# Patient Record
Sex: Male | Born: 1960 | Race: White | Hispanic: No | Marital: Married | Smoking: Former smoker
Health system: Southern US, Community
[De-identification: ages and names within clinical notes are randomized; demographics above are authoritative.]

## PROBLEM LIST (undated history)

## (undated) DIAGNOSIS — J189 Pneumonia, unspecified organism: Secondary | ICD-10-CM

## (undated) DIAGNOSIS — E785 Hyperlipidemia, unspecified: Secondary | ICD-10-CM

## (undated) DIAGNOSIS — K219 Gastro-esophageal reflux disease without esophagitis: Secondary | ICD-10-CM

## (undated) DIAGNOSIS — J449 Chronic obstructive pulmonary disease, unspecified: Secondary | ICD-10-CM

## (undated) DIAGNOSIS — I10 Essential (primary) hypertension: Secondary | ICD-10-CM

## (undated) DIAGNOSIS — G4730 Sleep apnea, unspecified: Secondary | ICD-10-CM

## (undated) HISTORY — DX: Chronic obstructive pulmonary disease, unspecified: J44.9

## (undated) HISTORY — DX: Gastro-esophageal reflux disease without esophagitis: K21.9

## (undated) HISTORY — DX: Pneumonia, unspecified organism: J18.9

## (undated) HISTORY — DX: Essential (primary) hypertension: I10

## (undated) HISTORY — DX: Hyperlipidemia, unspecified: E78.5

## (undated) HISTORY — DX: Sleep apnea, unspecified: G47.30

## (undated) HISTORY — PX: COLONOSCOPY: SHX174

## (undated) HISTORY — PX: INGUINAL HERNIA REPAIR: SUR1180

## (undated) HISTORY — PX: VASECTOMY: SHX75

---

## 1998-12-15 ENCOUNTER — Ambulatory Visit: Admission: RE | Admit: 1998-12-15 | Discharge: 1998-12-15 | Payer: Self-pay | Admitting: *Deleted

## 2004-10-28 ENCOUNTER — Ambulatory Visit (HOSPITAL_COMMUNITY): Admission: RE | Admit: 2004-10-28 | Discharge: 2004-10-28 | Payer: Self-pay | Admitting: Surgery

## 2004-10-28 ENCOUNTER — Encounter (INDEPENDENT_AMBULATORY_CARE_PROVIDER_SITE_OTHER): Payer: Self-pay | Admitting: *Deleted

## 2008-01-10 ENCOUNTER — Encounter: Payer: Self-pay | Admitting: Gastroenterology

## 2008-02-07 ENCOUNTER — Ambulatory Visit: Payer: Self-pay | Admitting: Gastroenterology

## 2008-02-07 DIAGNOSIS — R7401 Elevation of levels of liver transaminase levels: Secondary | ICD-10-CM | POA: Insufficient documentation

## 2008-02-07 DIAGNOSIS — R740 Nonspecific elevation of levels of transaminase and lactic acid dehydrogenase [LDH]: Secondary | ICD-10-CM

## 2008-02-07 DIAGNOSIS — R1032 Left lower quadrant pain: Secondary | ICD-10-CM | POA: Insufficient documentation

## 2008-02-07 LAB — CONVERTED CEMR LAB
AST: 28 units/L (ref 0–37)
Albumin: 4.3 g/dL (ref 3.5–5.2)
Alkaline Phosphatase: 40 units/L (ref 39–117)
Basophils Absolute: 0 10*3/uL (ref 0.0–0.1)
Eosinophils Absolute: 0.1 10*3/uL (ref 0.0–0.7)
HCT: 50.1 % (ref 39.0–52.0)
MCHC: 33.8 g/dL (ref 30.0–36.0)
MCV: 89.6 fL (ref 78.0–100.0)
Monocytes Absolute: 0.4 10*3/uL (ref 0.1–1.0)
Monocytes Relative: 9.8 % (ref 3.0–12.0)
Neutro Abs: 2.3 10*3/uL (ref 1.4–7.7)
Platelets: 125 10*3/uL — ABNORMAL LOW (ref 150–400)
Potassium: 4.7 meq/L (ref 3.5–5.1)
RDW: 12.6 % (ref 11.5–14.6)
Sodium: 141 meq/L (ref 135–145)
Total Bilirubin: 1 mg/dL (ref 0.3–1.2)
Total Protein: 6.3 g/dL (ref 6.0–8.3)

## 2008-02-20 ENCOUNTER — Ambulatory Visit: Payer: Self-pay | Admitting: Gastroenterology

## 2008-02-20 ENCOUNTER — Encounter: Payer: Self-pay | Admitting: Gastroenterology

## 2008-02-21 LAB — CONVERTED CEMR LAB
Ferritin: 256.3 ng/mL (ref 22.0–322.0)
IgA: 143 mg/dL (ref 68–378)
Saturation Ratios: 31.3 % (ref 20.0–50.0)

## 2008-02-22 ENCOUNTER — Encounter: Payer: Self-pay | Admitting: Gastroenterology

## 2008-02-24 ENCOUNTER — Telehealth: Payer: Self-pay | Admitting: Gastroenterology

## 2008-02-27 LAB — CONVERTED CEMR LAB
Anti Nuclear Antibody(ANA): NEGATIVE
Ceruloplasmin: 15 mg/dL — ABNORMAL LOW (ref 21–63)
Hep A Total Ab: NEGATIVE
Hep B S Ab: NEGATIVE

## 2008-03-05 ENCOUNTER — Ambulatory Visit: Payer: Self-pay | Admitting: Gastroenterology

## 2008-03-05 ENCOUNTER — Telehealth (INDEPENDENT_AMBULATORY_CARE_PROVIDER_SITE_OTHER): Payer: Self-pay | Admitting: *Deleted

## 2008-03-12 ENCOUNTER — Encounter: Payer: Self-pay | Admitting: Gastroenterology

## 2008-03-19 ENCOUNTER — Telehealth (INDEPENDENT_AMBULATORY_CARE_PROVIDER_SITE_OTHER): Payer: Self-pay | Admitting: *Deleted

## 2008-03-19 LAB — CONVERTED CEMR LAB
Creatinine 24 HR UR: 4712 mg/24hr — ABNORMAL HIGH (ref 800–2000)
Creatinine, Urine: 157.1 mg/dL

## 2008-03-22 ENCOUNTER — Telehealth (INDEPENDENT_AMBULATORY_CARE_PROVIDER_SITE_OTHER): Payer: Self-pay | Admitting: *Deleted

## 2008-03-28 ENCOUNTER — Encounter: Payer: Self-pay | Admitting: Gastroenterology

## 2008-04-10 ENCOUNTER — Ambulatory Visit: Payer: Self-pay | Admitting: Gastroenterology

## 2008-04-11 ENCOUNTER — Ambulatory Visit: Payer: Self-pay | Admitting: Gastroenterology

## 2008-04-12 LAB — CONVERTED CEMR LAB
AST: 27 units/L (ref 0–37)
Albumin: 4.3 g/dL (ref 3.5–5.2)
Alkaline Phosphatase: 31 units/L — ABNORMAL LOW (ref 39–117)
Total Bilirubin: 1.2 mg/dL (ref 0.3–1.2)

## 2008-09-19 ENCOUNTER — Ambulatory Visit: Payer: Self-pay | Admitting: Gastroenterology

## 2008-09-20 LAB — CONVERTED CEMR LAB
ALT: 48 units/L (ref 0–53)
AST: 23 units/L (ref 0–37)
Alkaline Phosphatase: 36 units/L — ABNORMAL LOW (ref 39–117)
Total Bilirubin: 1.2 mg/dL (ref 0.3–1.2)

## 2009-03-21 ENCOUNTER — Ambulatory Visit: Payer: Self-pay | Admitting: Gastroenterology

## 2009-03-21 ENCOUNTER — Telehealth (INDEPENDENT_AMBULATORY_CARE_PROVIDER_SITE_OTHER): Payer: Self-pay | Admitting: *Deleted

## 2009-03-21 LAB — CONVERTED CEMR LAB
ALT: 65 units/L — ABNORMAL HIGH (ref 0–53)
AST: 34 units/L (ref 0–37)
Albumin: 4 g/dL (ref 3.5–5.2)
Total Bilirubin: 1.2 mg/dL (ref 0.3–1.2)

## 2009-03-27 ENCOUNTER — Encounter: Payer: Self-pay | Admitting: Cardiology

## 2009-04-16 ENCOUNTER — Ambulatory Visit: Payer: Self-pay | Admitting: Gastroenterology

## 2011-01-30 NOTE — Op Note (Signed)
Richard Moore, Richard Moore             ACCOUNT NO.:  [id number]   MEDICAL RECORD NO.:  [id number]          PATIENT TYPE:  AMB   LOCATION:  DAY                          FACILITY:  [facility]   PHYSICIAN:  Sandria Bales. Ezzard Standing, M.D.  DATE OF BIRTH:  Mar 04, 1961   DATE OF PROCEDURE:  10/28/2004  DATE OF DISCHARGE:                                 OPERATIVE REPORT   PREOPERATIVE DIAGNOSES:  1.  Right inguinal hernia.  2.  Umbilical hernia.  3.  Desire for sterilization.   POSTOPERATIVE DIAGNOSES:  1.  Direct right inguinal hernia.  2.  A 2 cm umbilical hernia.  3.  Desire for sterilization.   PROCEDURE:  Laparoscopic right inguinal hernia repair with onlay mesh,  repair of umbilical hernia, bilateral vasectomy.   SURGEON:  Dr. Ezzard Standing   FIRST ASSISTANT:  None.   ANESTHESIA:  General endotracheal.   ESTIMATED BLOOD LOSS:  Minimal.   INDICATIONS FOR PROCEDURE:  Richard Moore is a pleasant 50 year old white  male, who has a symptomatic umbilical and right inguinal hernia.  I have  discussed with him about repairing this hernia, both open and laparoscopic  and think because he has the umbilical hernia, he would be a good candidate  for laparoscopic hernia repair.  He also was interested in having a vasectomy at this time.  We talked about  vasectomy being a permanent form of sterilization, talked about that at the  time of surgery, the vas resect would be sent to pathology so he would have  report of that.  I suggested that he have a sperm count 8-12 weeks postop to  make sure that his sperm count is zero.   He also realizes that vasectomies can have failure rates, but they are very  rare.   OPERATIVE NOTE:  The patient placed in a supine position.  He had his left  arm tucked to his side.  He was given 1 g of Ancef at the initiation of the  procedure, had a Foley catheter in place.  His abdomen was shaved, prepped  with Betadine solution, and sterilely draped.   I made three abdominal  incisions, an infraumbilical incision, anticipating  repair of his umbilical hernia.  Then I placed a small  incision on the  inside of his right lower quadrant and inside of his left lower quadrant.  I  first got into the preperitoneal space using the preperitoneal balloon PVD-2  balloon with finger distention of the midline, though it did not do very  well lateral disseciton.  I then had to dissect out on the left side.  I  used 5 mm from the [employer], trocars both in the right and left lower  quadrants.   I then dissected out the inguinal floor.  The patient had a medium-sized  direct inguinal hernia with a hernia hole about 2.5-3 cm in diameter.  I did  not see any of his indirect inguinal hernia or indirect sac.  I did dissect  out the vas, first on the right.  I divided this between double staples, and  I took a piece of  about 1-1.5 cm of the vas out and sent to pathology.  I  then placed in an onlay mesh which was approximately 4 x 6 cm, with slight  tapering laterally.  It was placed in the preperitoneal space and tacked  medially to the pubic tubercle and inferiorly to the shelving edge of  Cooper's ligament, superiorly to transversalis fascia, and then laterally on  palpation to transversalis fascia.  I made sure that I put no staples  lateral to the iliac vessels and inferior to the curve of the inguinal  floor.   I then turned my attention to the left side where I was able to dissect out  the left vas laparoscopically without doing much dissection of the cord  structures themselves.  I took about a 1.5-2 cm segment of vas, sent this to  pathology, put double clips above and below on the vas.   I then removed the trocars in turn.  I removed the umbilical trocar and then  turned my attention to the umbilical hernia.   The umbilical hernia I dissected off the hernia off the hernia sac, got into  the peritoneal space and, again, the base was just 2 cm above where I had   gotten behind his rectus abdominus muscle for his hernias.   I then repaired the umbilical hernia with interrupted #1 Novofil sutures,  two medial sutures which are inverting and two lateral sutures which were  not inverting.   At this point, I completed the  umbilical hernia repair, I had repaired his  inguinal hernia and done the vasectomy.  I tacked the skin back down using 3-  0 Vicryl sutures, closed the subcutaneous tissue with 3-0 Vicryl sutures and  the skin with a 4-0 Monocryl suture.  I closed both the lower abdominal  ports with 4-0 Monocryl suture, painted each wound with tincture of Benzoin  and steri-stripped it.   He tolerated the procedure well, was transported to recovery room in good  condition.  Sponge and needle count were correct at the end of the case.  He  will be discharged home today and will return to see me in 2 weeks.      DHN/MEDQ  D:  10/28/2004  T:  10/28/2004  Job:  [id number]   cc:   Montey Hora, MD  [facility]. Prac.

## 2011-04-27 ENCOUNTER — Encounter: Payer: Self-pay | Admitting: Gastroenterology

## 2011-08-05 ENCOUNTER — Encounter: Payer: Self-pay | Admitting: Gastroenterology

## 2011-08-14 ENCOUNTER — Ambulatory Visit: Payer: Self-pay | Admitting: Gastroenterology

## 2011-08-21 ENCOUNTER — Ambulatory Visit: Payer: Self-pay | Admitting: Gastroenterology

## 2011-08-21 ENCOUNTER — Ambulatory Visit (AMBULATORY_SURGERY_CENTER): Payer: BC Managed Care – PPO

## 2011-08-21 VITALS — Ht 75.0 in | Wt 233.9 lb

## 2011-08-21 DIAGNOSIS — K625 Hemorrhage of anus and rectum: Secondary | ICD-10-CM

## 2011-08-21 DIAGNOSIS — Z860100 Personal history of colon polyps, unspecified: Secondary | ICD-10-CM

## 2011-08-21 MED ORDER — PEG-KCL-NACL-NASULF-NA ASC-C 100 G PO SOLR: 1.0000 | Freq: Once | ORAL | Status: AC

## 2011-08-24 ENCOUNTER — Encounter: Payer: Self-pay | Admitting: Gastroenterology

## 2011-08-28 ENCOUNTER — Encounter: Payer: Self-pay | Admitting: Gastroenterology

## 2011-08-28 ENCOUNTER — Ambulatory Visit (AMBULATORY_SURGERY_CENTER): Payer: BC Managed Care – PPO | Admitting: Gastroenterology

## 2011-08-28 VITALS — BP 133/91 | HR 59 | Temp 96.7°F | Resp 16 | Ht 75.0 in | Wt 233.0 lb

## 2011-08-28 DIAGNOSIS — Z860100 Personal history of colon polyps, unspecified: Secondary | ICD-10-CM

## 2011-08-28 DIAGNOSIS — D126 Benign neoplasm of colon, unspecified: Secondary | ICD-10-CM

## 2011-08-28 DIAGNOSIS — Z1211 Encounter for screening for malignant neoplasm of colon: Secondary | ICD-10-CM

## 2011-08-28 MED ORDER — SODIUM CHLORIDE 0.9 % IV SOLN: 500.0000 mL | INTRAVENOUS | Status: DC

## 2011-08-28 NOTE — Patient Instructions (Signed)
Please follow all discharge instructions given to you by the recovery room nurse. If you have any questions or problems after discharge please call [phone]. You will receive a phone call in the am to see how you are doing and answer any questions you may have. Thank you for choosing [facility] for your health care needs.  

## 2011-08-28 NOTE — Progress Notes (Signed)
Patient did not experience any of the following events: a burn prior to discharge; a fall within the facility; wrong site/side/patient/procedure/implant event; or a hospital transfer or hospital admission upon discharge from the facility. (G8907) Patient did not have preoperative order for IV antibiotic SSI prophylaxis. (G8918)  

## 2011-08-28 NOTE — Op Note (Signed)
[facility] [address] [place], [place]  [address]  COLONOSCOPY PROCEDURE REPORT  PATIENT:  Richard Moore, Richard Moore  MR#:  [id number] BIRTHDATE:  20-Nov-1960, 50 yrs. old  GENDER:  male ENDOSCOPIST:  Rachael Fee, MD PROCEDURE DATE:  08/28/2011 PROCEDURE:  Colonoscopy with biopsy ASA CLASS:  Class II INDICATIONS:  four adenomatous polyps removed 2009 MEDICATIONS:   Fentanyl 75 mcg IV, These medications were titrated to patient response per physician's verbal order, Versed 8 mg IV  DESCRIPTION OF PROCEDURE:   After the risks benefits and alternatives of the procedure were thoroughly explained, informed consent was obtained.  Digital rectal exam was performed and revealed no rectal masses.   The LB [id number] endoscope was introduced through the anus and advanced to the cecum, which was identified by both the appendix and ileocecal valve, without limitations.  The quality of the prep was good..  The instrument was then slowly withdrawn as the colon was fully examined. <<PROCEDUREIMAGES>> FINDINGS:  A diminutive polyp was found in the ascending colon. This was removed with forceps and sent to pathology (jar 1).  This was otherwise a normal examination of the colon (see image2, image1, and image3).   Retroflexed views in the rectum revealed no abnormalities. COMPLICATIONS:  None  ENDOSCOPIC IMPRESSION: 1) Diminutive polyp in the ascending colon, this was removed and sent to pathology 2) Otherwise normal examination  RECOMMENDATIONS: 1) Given your personal history of adenomatous (pre-cancerous) polyps, you will need a repeat colonoscopy in 5 years even if the polyp removed today is NOT precancerous. 2) You will receive a letter within 1-2 weeks with the results of your biopsy as well as final recommendations. Please call my office if you have not received a letter after 3 weeks.  ______________________________ Rachael Fee, MD  cc: Rudi Heap,  MD  n. Rosalie Doctor:   Rachael Fee at 08/28/2011 11:31 AM  Ronette Deter, [phone]

## 2011-08-31 ENCOUNTER — Telehealth: Payer: Self-pay | Admitting: *Deleted

## 2011-08-31 NOTE — Telephone Encounter (Signed)

## 2012-01-13 ENCOUNTER — Emergency Department (HOSPITAL_COMMUNITY): Payer: BC Managed Care – PPO

## 2012-01-13 ENCOUNTER — Emergency Department (HOSPITAL_COMMUNITY)
Admission: EM | Admit: 2012-01-13 | Discharge: 2012-01-14 | Disposition: A | Discharge status: Home / Self Care | Payer: BC Managed Care – PPO | Attending: Emergency Medicine | Admitting: Emergency Medicine

## 2012-01-13 ENCOUNTER — Encounter (HOSPITAL_COMMUNITY): Payer: Self-pay | Admitting: *Deleted

## 2012-01-13 DIAGNOSIS — R51 Headache: Secondary | ICD-10-CM | POA: Insufficient documentation

## 2012-01-13 DIAGNOSIS — M549 Dorsalgia, unspecified: Secondary | ICD-10-CM | POA: Insufficient documentation

## 2012-01-13 DIAGNOSIS — R6883 Chills (without fever): Secondary | ICD-10-CM | POA: Insufficient documentation

## 2012-01-13 DIAGNOSIS — I10 Essential (primary) hypertension: Secondary | ICD-10-CM | POA: Insufficient documentation

## 2012-01-13 DIAGNOSIS — E785 Hyperlipidemia, unspecified: Secondary | ICD-10-CM | POA: Insufficient documentation

## 2012-01-13 DIAGNOSIS — M545 Low back pain, unspecified: Secondary | ICD-10-CM | POA: Insufficient documentation

## 2012-01-13 LAB — DIFFERENTIAL
Basophils Absolute: 0 10*3/uL (ref 0.0–0.1)
Basophils Relative: 0 % (ref 0–1)
Eosinophils Absolute: 0 10*3/uL (ref 0.0–0.7)
Eosinophils Relative: 0 % (ref 0–5)
Lymphocytes Relative: 13 % (ref 12–46)
Lymphs Abs: 1.5 10*3/uL (ref 0.7–4.0)
Monocytes Absolute: 1.5 10*3/uL — ABNORMAL HIGH (ref 0.1–1.0)
Monocytes Relative: 13 % — ABNORMAL HIGH (ref 3–12)
Neutro Abs: 8.8 10*3/uL — ABNORMAL HIGH (ref 1.7–7.7)
Neutrophils Relative %: 74 % (ref 43–77)

## 2012-01-13 LAB — BASIC METABOLIC PANEL
Calcium: 9.8 mg/dL (ref 8.4–10.5)
Chloride: 94 mEq/L — ABNORMAL LOW (ref 96–112)
Creatinine, Ser: 1.17 mg/dL (ref 0.50–1.35)
GFR calc Af Amer: 83 mL/min — ABNORMAL LOW (ref >90)

## 2012-01-13 LAB — CBC
HCT: 45.1 % (ref 39.0–52.0)
MCHC: 35.5 g/dL (ref 30.0–36.0)
MCV: 86.4 fL (ref 78.0–100.0)
RDW: 12.6 % (ref 11.5–15.5)

## 2012-01-13 LAB — URINALYSIS, ROUTINE W REFLEX MICROSCOPIC
Glucose, UA: NEGATIVE mg/dL
Hgb urine dipstick: NEGATIVE
Ketones, ur: NEGATIVE mg/dL
Protein, ur: NEGATIVE mg/dL

## 2012-01-13 MED ORDER — HYDROMORPHONE HCL PF 1 MG/ML IJ SOLN
1.0000 mg | Freq: Once | INTRAMUSCULAR | Status: AC
  Administered 2012-01-13: 1 mg via INTRAVENOUS
  Filled 2012-01-13: qty 1

## 2012-01-13 MED ORDER — OXYCODONE-ACETAMINOPHEN 5-325 MG PO TABS: 1.0000 | ORAL_TABLET | ORAL | Status: AC | PRN

## 2012-01-13 MED ORDER — HYDROMORPHONE HCL PF 2 MG/ML IJ SOLN
2.0000 mg | Freq: Once | INTRAMUSCULAR | Status: AC
  Administered 2012-01-13: 2 mg via INTRAMUSCULAR
  Filled 2012-01-13: qty 1

## 2012-01-13 MED ORDER — METOCLOPRAMIDE HCL 10 MG PO TABS
10.0000 mg | ORAL_TABLET | Freq: Once | ORAL | Status: AC
  Administered 2012-01-13: 10 mg via ORAL
  Filled 2012-01-13: qty 1

## 2012-01-13 MED ORDER — ONDANSETRON 8 MG PO TBDP
8.0000 mg | ORAL_TABLET | Freq: Once | ORAL | Status: AC
  Administered 2012-01-13: 8 mg via ORAL
  Filled 2012-01-13: qty 1

## 2012-01-13 MED ORDER — CYCLOBENZAPRINE HCL 10 MG PO TABS: 10.0000 mg | ORAL_TABLET | Freq: Three times a day (TID) | ORAL | Status: AC | PRN

## 2012-01-13 MED ORDER — HYDROMORPHONE HCL PF 1 MG/ML IJ SOLN
1.0000 mg | Freq: Once | INTRAMUSCULAR | Status: AC
  Administered 2012-01-13: 1 mg via INTRAMUSCULAR
  Filled 2012-01-13: qty 1

## 2012-01-13 NOTE — ED Notes (Signed)
Patient transported to X-ray 

## 2012-01-13 NOTE — ED Provider Notes (Signed)
Medical screening examination/treatment/procedure(s) were conducted as a shared visit with non-physician practitioner(s) and myself.  I personally evaluated the patient during the encounter   Benny Lennert, MD 01/13/12 [phone]

## 2012-01-13 NOTE — Discharge Instructions (Signed)
Back Pain, Adult  Low back pain is very common. About 1 in 5 people have back pain.The cause of low back pain is rarely dangerous. The pain often gets better over time.About half of people with a sudden onset of back pain feel better in just 2 weeks. About 8 in 10 people feel better by 6 weeks.   CAUSES  Some common causes of back pain include:   Strain of the muscles or ligaments supporting the spine.   Wear and tear (degeneration) of the spinal discs.   Arthritis.   Direct injury to the back.  DIAGNOSIS  Most of the time, the direct cause of low back pain is not known.However, back pain can be treated effectively even when the exact cause of the pain is unknown.Answering your caregiver's questions about your overall health and symptoms is one of the most accurate ways to make sure the cause of your pain is not dangerous. If your caregiver needs more information, he or she may order lab work or imaging tests (X-rays or MRIs).However, even if imaging tests show changes in your back, this usually does not require surgery.  HOME CARE INSTRUCTIONS  For many people, back pain returns.Since low back pain is rarely dangerous, it is often a condition that people can learn to manageon their own.    Remain active. It is stressful on the back to sit or stand in one place. Do not sit, drive, or stand in one place for more than 30 minutes at a time. Take short walks on level surfaces as soon as pain allows.Try to increase the length of time you walk each day.   Do not stay in bed.Resting more than 1 or 2 days can delay your recovery.   Do not avoid exercise or work.Your body is made to move.It is not dangerous to be active, even though your back may hurt.Your back will likely heal faster if you return to being active before your pain is gone.   Pay attention to your body when you bend and lift. Many people have less discomfortwhen lifting if they bend their knees, keep the load close to their bodies,and  avoid twisting. Often, the most comfortable positions are those that put less stress on your recovering back.   Find a comfortable position to sleep. Use a firm mattress and lie on your side with your knees slightly bent. If you lie on your back, put a pillow under your knees.   Only take over-the-counter or prescription medicines as directed by your caregiver. Over-the-counter medicines to reduce pain and inflammation are often the most helpful.Your caregiver may prescribe muscle relaxant drugs.These medicines help dull your pain so you can more quickly return to your normal activities and healthy exercise.   Put ice on the injured area.   Put ice in a plastic bag.   Place a towel between your skin and the bag.   Leave the ice on for 15 to 20 minutes, 3 to 4 times a day for the first 2 to 3 days. After that, ice and heat may be alternated to reduce pain and spasms.   Ask your caregiver about trying back exercises and gentle massage. This may be of some benefit.   Avoid feeling anxious or stressed.Stress increases muscle tension and can worsen back pain.It is important to recognize when you are anxious or stressed and learn ways to manage it.Exercise is a great option.  SEEK MEDICAL CARE IF:   You have pain that is not   relieved with rest or medicine.   You have pain that does not improve in 1 week.   You have new symptoms.   You are generally not feeling well.  SEEK IMMEDIATE MEDICAL CARE IF:    You have pain that radiates from your back into your legs.   You develop new bowel or bladder control problems.   You have unusual weakness or numbness in your arms or legs.   You develop nausea or vomiting.   You develop abdominal pain.   You feel faint.  Document Released: 08/31/2005 Document Revised: 08/20/2011 Document Reviewed: 01/19/2011  [employer] Patient Information 2012 [employer]  Headache, General, Unknown Cause  The specific cause of your headache may not have been found today. There  are many causes and types of headache. A few common ones are:   Tension headache.   Migraine.   Infections (examples: dental and sinus infections).   Bone and/or joint problems in the neck or jaw.   Depression.   Eye problems.  These headaches are not life threatening.   Headaches can sometimes be diagnosed by a patient history and a physical exam. Sometimes, lab and imaging studies (such as x-ray and/or CT scan) are used to rule out more serious problems. In some cases, a spinal tap (lumbar puncture) may be requested. There are many times when your exam and tests may be normal on the first visit even when there is a serious problem causing your headaches. Because of that, it is very important to follow up with your doctor or local clinic for further evaluation.  FINDING OUT THE RESULTS OF TESTS   If a radiology test was performed, a radiologist will review your results.   You will be contacted by the emergency department or your physician if any test results require a change in your treatment plan.   Not all test results may be available during your visit. If your test results are not back during the visit, make an appointment with your caregiver to find out the results. Do not assume everything is normal if you have not heard from your caregiver or the medical facility. It is important for you to follow up on all of your test results.  HOME CARE INSTRUCTIONS    Keep follow-up appointments with your caregiver, or any specialist referral.   Only take over-the-counter or prescription medicines for pain, discomfort, or fever as directed by your caregiver.   Biofeedback, massage, or other relaxation techniques may be helpful.   Ice packs or heat applied to the head and neck can be used. Do this three to four times per day, or as needed.   Call your doctor if you have any questions or concerns.   If you smoke, you should quit.  SEEK MEDICAL CARE IF:    You develop problems with medications  prescribed.   You do not respond to or obtain relief from medications.   You have a change from the usual headache.   You develop nausea or vomiting.  SEEK IMMEDIATE MEDICAL CARE IF:    If your headache becomes severe.   You have an unexplained oral temperature above 102 F (38.9 C), or as your caregiver suggests.   You have a stiff neck.   You have loss of vision.   You have muscular weakness.   You have loss of muscular control.   You develop severe symptoms different from your first symptoms.   You start losing your balance or have trouble walking.     You feel faint or pass out.  MAKE SURE YOU:    Understand these instructions.   Will watch your condition.   Will get help right away if you are not doing well or get worse.  Document Released: 08/31/2005 Document Revised: 08/20/2011 Document Reviewed: 04/19/2008  [employer] Patient Information 2012 [employer]

## 2012-01-13 NOTE — ED Provider Notes (Signed)
History     CSN: [id number]  Arrival date & time 01/13/12  7829   First MD Initiated Contact with Patient 01/13/12 1853      Chief Complaint  Patient presents with  . Back Pain    (Consider location/radiation/quality/duration/timing/severity/associated sxs/prior treatment) HPI Comments: Patient c/o diffuse back pain, nausea, chills and intermittent headaches for 3 days.  States he woke up with sharp , stabbing type pain to his back . States the pain radiates from his shoulder blades to his feet. He denies injury, chest pain, dyspnea, abd pain, incontinence or feces or urine, numbness or weakness.  He also c/o intermittent headaches for "months" and states the headache has persisted since the onset of back pain.  He describes the headache as throbbing pain to the top of his head. He denies neck pain or stiffness, or visual changes   Patient is a 51 y.o. male presenting with back pain. The history is provided by the patient.  Back Pain  This is a new problem. The current episode started more than 2 days ago. The problem occurs constantly. The problem has been gradually worsening. The pain is associated with no known injury. The pain is present in the lumbar spine and thoracic spine. The quality of the pain is described as shooting and stabbing. The pain radiates to the left thigh, right thigh, left foot and right foot. The pain is severe. Exacerbated by: movement. The pain is the same all the time. Associated symptoms include headaches and leg pain. Pertinent negatives include no chest pain, no fever, no numbness, no abdominal pain, no abdominal swelling, no bowel incontinence, no perianal numbness, no bladder incontinence, no dysuria, no pelvic pain, no paresthesias, no paresis, no tingling and no weakness. Associated symptoms comments: Chills, nausea. He has tried NSAIDs for the symptoms. The treatment provided no relief.    Past Medical History  Diagnosis Date  . Hypertension   .  Hyperlipidemia     Past Surgical History  Procedure Date  . Inguinal hernia repair     right  . Vasectomy     History reviewed. No pertinent family history.  History  Substance Use Topics  . Smoking status: Former Smoker    Types: Cigarettes  . Smokeless tobacco: Never Used  . Alcohol Use: 3.0 oz/week    6 drink(s) per week      Review of Systems  Constitutional: Positive for chills. Negative for fever, activity change and appetite change.  HENT: Negative for neck pain and neck stiffness.   Respiratory: Negative for cough and shortness of breath.   Cardiovascular: Negative for chest pain.  Gastrointestinal: Positive for nausea and constipation. Negative for vomiting, abdominal pain and bowel incontinence.  Genitourinary: Negative for bladder incontinence, dysuria, hematuria, flank pain, decreased urine volume, difficulty urinating, testicular pain and pelvic pain.       Perineal numbness or incontinence of urine or feces  Musculoskeletal: Positive for back pain. Negative for joint swelling.  Skin: Negative for color change and rash.  Neurological: Positive for headaches. Negative for tingling, weakness, numbness and paresthesias.  All other systems reviewed and are negative.    Allergies  Review of patient's allergies indicates no known allergies.  Home Medications  No current outpatient prescriptions on file.  BP 133/82  Pulse 111  Temp(Src) 99.7 F (37.6 C) (Oral)  Resp 20  Ht 6\' 3"  (1.905 m)  Wt 220 lb (99.791 kg)  BMI 27.50 kg/m2  SpO2 100%  Physical Exam  Nursing note  and vitals reviewed. Constitutional: He is oriented to person, place, and time. He appears well-developed and well-nourished.       Appears uncomfortable  HENT:  Head: Normocephalic and atraumatic.  Mouth/Throat: Oropharynx is clear and moist.  Neck: Normal range of motion. Neck supple.  Cardiovascular: Normal rate, regular rhythm and normal heart sounds.   No murmur  heard. Pulmonary/Chest: Effort normal and breath sounds normal. No respiratory distress. He exhibits no tenderness.  Abdominal: Soft. He exhibits no distension. There is no tenderness. There is no rebound and no guarding.  Musculoskeletal: Normal range of motion. He exhibits tenderness. He exhibits no edema.       Lumbar back: He exhibits tenderness and pain. He exhibits normal range of motion, no bony tenderness, no swelling, no edema, no deformity, no laceration and normal pulse.       Back:       ttp of the lumbar spine and paraspinal muscles or the lumbar and thoracic regions  Lymphadenopathy:    He has no cervical adenopathy.  Neurological: He is alert and oriented to person, place, and time. No sensory deficit. He exhibits normal muscle tone. Coordination normal.  Reflex Scores:      Patellar reflexes are 2+ on the right side and 2+ on the left side.      Achilles reflexes are 2+ on the right side and 2+ on the left side. Skin: Skin is warm and dry.    ED Course  Procedures (including critical care time)   Results for orders placed during the hospital encounter of 01/13/12  URINALYSIS, ROUTINE W REFLEX MICROSCOPIC      Component Value Range   Color, Urine YELLOW  YELLOW    APPearance CLEAR  CLEAR    Specific Gravity, Urine 1.025  1.005 - 1.030    pH 5.5  5.0 - 8.0    Glucose, UA NEGATIVE  NEGATIVE (mg/dL)   Hgb urine dipstick NEGATIVE  NEGATIVE    Bilirubin Urine NEGATIVE  NEGATIVE    Ketones, ur NEGATIVE  NEGATIVE (mg/dL)   Protein, ur NEGATIVE  NEGATIVE (mg/dL)   Urobilinogen, UA 0.2  0.0 - 1.0 (mg/dL)   Nitrite NEGATIVE  NEGATIVE    Leukocytes, UA NEGATIVE  NEGATIVE   CBC      Component Value Range   WBC 11.8 (*) 4.0 - 10.5 (K/uL)   RBC 5.22  4.22 - 5.81 (MIL/uL)   Hemoglobin 16.0  13.0 - 17.0 (g/dL)   HCT 44.0  10.2 - 72.5 (%)   MCV 86.4  78.0 - 100.0 (fL)   MCH 30.7  26.0 - 34.0 (pg)   MCHC 35.5  30.0 - 36.0 (g/dL)   RDW 36.6  44.0 - 34.7 (%)   Platelets 121  (*) 150 - 400 (K/uL)  DIFFERENTIAL      Component Value Range   Neutrophils Relative 74  43 - 77 (%)   Lymphocytes Relative 13  12 - 46 (%)   Monocytes Relative 13 (*) 3 - 12 (%)   Eosinophils Relative 0  0 - 5 (%)   Basophils Relative 0  0 - 1 (%)   Neutro Abs 8.8 (*) 1.7 - 7.7 (K/uL)   Lymphs Abs 1.5  0.7 - 4.0 (K/uL)   Monocytes Absolute 1.5 (*) 0.1 - 1.0 (K/uL)   Eosinophils Absolute 0.0  0.0 - 0.7 (K/uL)   Basophils Absolute 0.0  0.0 - 0.1 (K/uL)  BASIC METABOLIC PANEL      Component Value Range  Sodium 135  135 - 145 (mEq/L)   Potassium 4.1  3.5 - 5.1 (mEq/L)   Chloride 94 (*) 96 - 112 (mEq/L)   CO2 31  19 - 32 (mEq/L)   Glucose, Bld 108 (*) 70 - 99 (mg/dL)   BUN 18  6 - 23 (mg/dL)   Creatinine, Ser 2.13  0.50 - 1.35 (mg/dL)   Calcium 9.8  8.4 - 08.6 (mg/dL)   GFR calc non Af Amer 72 (*) >90 (mL/min)   GFR calc Af Amer 83 (*) >90 (mL/min)     Dg Lumbar Spine Complete  01/13/2012  *RADIOLOGY REPORT*  Clinical Data: Worsening low back pain  LUMBAR SPINE - COMPLETE 4+ VIEW  Comparison: None.  Findings: Normal alignment of the lumbar vertebral bodies.  No loss of vertebral body height or disc height.  No subluxation.  No pars fracture.  IMPRESSION: No acute findings lumbar spine.  Original Report Authenticated By: Genevive Bi, M.D.   Ct Head Wo Contrast  01/13/2012  *RADIOLOGY REPORT*  Clinical Data: Headache for 3 days, hypertension, denies injury  CT HEAD WITHOUT CONTRAST  Technique:  Contiguous axial images were obtained from the base of the skull through the vertex without contrast.  Comparison: None.  Findings:  There is overall increased attenuation of the intracranial vasculature most suggestive of a hypovolemic state.  Gray white differentiation is maintained.  No CT evidence of acute large territory infarct.  No intraparenchymal or extra-axial mass or hemorrhage.  Normal size and configuration of the ventricles and basilar cisterns.  No midline shift.  Minimal mucosal  thickening within the right maxillary sinus.  The remaining paranasal sinuses and mastoid air cells are normal.  Incidental note is made of leftward nasal septal deviation.  Regional soft tissues are normal. No displaced calvarial fracture.  IMPRESSION: 1.  Negative noncontrast head CT. 2.  Overall increased attenuation of the intracranial vasculature suggest hypovolemic state. 3.  Mucosal thickening within the right maxillary sinus.  Original Report Authenticated By: Waynard Reeds, M.D.    MDM    Previous medical charts, nursing notes and vitals signs from this visit were reviewed by me   All laboratory results and/or imaging results performed on this visit, if applicable, were reviewed by me and discussed with the patient and/or parent as well as recommendation for follow-up    MEDICATIONS GIVEN IN ED:  dilaudid, ODT zofran, reglan  Patient has diffuse ttp of the lumbar spine, lumbar and thoracic paraspinal muscles.  No focal neuro deficits on exam.  Sensation intact, DP pulses are brisk and equal bilaterally.  Ambulates with a slow , steady gait.  Patient was also seen by EDP    I will schedule patient to return here tomorrow for outpatient MRI of his lumbar spine.  Patient was also seen by EDP and care plan discussed.  Patient also agrees to f/u with his PMD at [facility] in 1-2 days.     PRESCRIPTIONS GIVEN AT DISCHARGE:  Percocet #20     Pt stable in ED with no significant deterioration in condition. Pt feels improved after observation and/or treatment in ED. Patient / Family / Caregiver understand and agree with initial ED impression and plan with expectations set for ED visit.  Patient agrees to return to ED for any worsening symptoms        Matha Masse L. [place], [place] 01/13/12 2334

## 2012-01-13 NOTE — ED Notes (Signed)
Gave patient ice water to drink as requested and approved by PA. Patient lying in bed resting at this time. Spouse at bedside.

## 2012-01-13 NOTE — ED Notes (Signed)
C/o lower back pain that radiates down both legs with nausea, denies burning on urination

## 2012-01-13 NOTE — ED Notes (Signed)
Low back pain since Sunday,  Felt shakey and hot. Nausea.

## 2012-01-14 ENCOUNTER — Ambulatory Visit (HOSPITAL_COMMUNITY)
Admit: 2012-01-14 | Discharge: 2012-01-14 | Disposition: A | Discharge status: Home / Self Care | Payer: BC Managed Care – PPO | Source: Ambulatory Visit | Attending: Family Medicine | Admitting: Family Medicine

## 2012-01-14 DIAGNOSIS — M545 Low back pain, unspecified: Secondary | ICD-10-CM | POA: Insufficient documentation

## 2012-01-14 DIAGNOSIS — R937 Abnormal findings on diagnostic imaging of other parts of musculoskeletal system: Secondary | ICD-10-CM | POA: Insufficient documentation

## 2012-01-14 DIAGNOSIS — R209 Unspecified disturbances of skin sensation: Secondary | ICD-10-CM | POA: Insufficient documentation

## 2012-01-14 DIAGNOSIS — M5137 Other intervertebral disc degeneration, lumbosacral region: Secondary | ICD-10-CM | POA: Insufficient documentation

## 2012-01-14 DIAGNOSIS — M51379 Other intervertebral disc degeneration, lumbosacral region without mention of lumbar back pain or lower extremity pain: Secondary | ICD-10-CM | POA: Insufficient documentation

## 2012-01-14 MED ORDER — IBUPROFEN 800 MG PO TABS
800.0000 mg | ORAL_TABLET | Freq: Once | ORAL | Status: AC
  Administered 2012-01-14: 800 mg via ORAL
  Filled 2012-01-14: qty 1

## 2012-01-25 ENCOUNTER — Emergency Department (HOSPITAL_COMMUNITY)
Admission: EM | Admit: 2012-01-25 | Discharge: 2012-01-26 | Disposition: A | Discharge status: Home / Self Care | Payer: BC Managed Care – PPO | Attending: Emergency Medicine | Admitting: Emergency Medicine

## 2012-01-25 ENCOUNTER — Emergency Department (HOSPITAL_COMMUNITY): Payer: BC Managed Care – PPO

## 2012-01-25 ENCOUNTER — Encounter (HOSPITAL_COMMUNITY): Payer: Self-pay | Admitting: Emergency Medicine

## 2012-01-25 DIAGNOSIS — E785 Hyperlipidemia, unspecified: Secondary | ICD-10-CM | POA: Insufficient documentation

## 2012-01-25 DIAGNOSIS — I10 Essential (primary) hypertension: Secondary | ICD-10-CM | POA: Insufficient documentation

## 2012-01-25 DIAGNOSIS — M549 Dorsalgia, unspecified: Secondary | ICD-10-CM | POA: Insufficient documentation

## 2012-01-25 DIAGNOSIS — R51 Headache: Secondary | ICD-10-CM

## 2012-01-25 DIAGNOSIS — Z79899 Other long term (current) drug therapy: Secondary | ICD-10-CM | POA: Insufficient documentation

## 2012-01-25 LAB — CBC
Platelets: 255 10*3/uL (ref 150–400)
RDW: 12.4 % (ref 11.5–15.5)
WBC: 11.2 10*3/uL — ABNORMAL HIGH (ref 4.0–10.5)

## 2012-01-25 LAB — DIFFERENTIAL
Basophils Absolute: 0 10*3/uL (ref 0.0–0.1)
Basophils Relative: 0 % (ref 0–1)
Eosinophils Absolute: 0 10*3/uL (ref 0.0–0.7)
Eosinophils Relative: 0 % (ref 0–5)
Lymphocytes Relative: 13 % (ref 12–46)
Lymphs Abs: 1.4 10*3/uL (ref 0.7–4.0)
Monocytes Absolute: 0.6 10*3/uL (ref 0.1–1.0)
Monocytes Relative: 5 % (ref 3–12)
Neutro Abs: 9.2 10*3/uL — ABNORMAL HIGH (ref 1.7–7.7)
Neutrophils Relative %: 82 % — ABNORMAL HIGH (ref 43–77)

## 2012-01-25 LAB — BASIC METABOLIC PANEL
BUN: 14 mg/dL (ref 6–23)
CO2: 26 mEq/L (ref 19–32)
Calcium: 9.2 mg/dL (ref 8.4–10.5)
Chloride: 96 mEq/L (ref 96–112)
Creatinine, Ser: 0.91 mg/dL (ref 0.50–1.35)
GFR calc Af Amer: >90 mL/min (ref >90)
GFR calc non Af Amer: >90 mL/min (ref >90)
Glucose, Bld: 169 mg/dL — ABNORMAL HIGH (ref 70–99)
Potassium: 3.3 mEq/L — ABNORMAL LOW (ref 3.5–5.1)
Sodium: 133 mEq/L — ABNORMAL LOW (ref 135–145)

## 2012-01-25 MED ORDER — HYDROMORPHONE HCL PF 2 MG/ML IJ SOLN
2.0000 mg | Freq: Once | INTRAMUSCULAR | Status: AC
  Administered 2012-01-26: 2 mg via INTRAVENOUS
  Filled 2012-01-25: qty 1

## 2012-01-25 NOTE — ED Notes (Signed)
PT. REPORTS HEADACHE , FEVER , BACK ACHE ADVISED BY HIS PCP TO GO TO ER FOR SPINAL TAP , CT AND MRI DONE AT [facility] LAST WEEK .

## 2012-01-25 NOTE — ED Notes (Signed)
Spoke with PA on phone.  States pt has been treated multiple times for back pain and HA as well as sinusitis.  PA recommends spinal tap.  Has had CXR, CT of the head as well as MRI.

## 2012-01-25 NOTE — ED Notes (Signed)
The pt has had a headache for 2 weeks he has had a mri and a c-t of the head.  He says he was sent here from his doctors  Office fro a lp

## 2012-01-26 LAB — CBC
HCT: 40.2 % (ref 39.0–52.0)
Hemoglobin: 14.2 g/dL (ref 13.0–17.0)
MCH: 30.4 pg (ref 26.0–34.0)
MCHC: 35.3 g/dL (ref 30.0–36.0)
MCV: 86.1 fL (ref 78.0–100.0)
Platelets: 240 10*3/uL (ref 150–400)
RBC: 4.67 MIL/uL (ref 4.22–5.81)
RDW: 12.4 % (ref 11.5–15.5)
WBC: 11.2 10*3/uL — ABNORMAL HIGH (ref 4.0–10.5)

## 2012-01-26 LAB — COMPREHENSIVE METABOLIC PANEL
ALT: 29 U/L (ref 0–53)
AST: 15 U/L (ref 0–37)
Albumin: 3.5 g/dL (ref 3.5–5.2)
Alkaline Phosphatase: 43 U/L (ref 39–117)
BUN: 14 mg/dL (ref 6–23)
CO2: 29 mEq/L (ref 19–32)
Calcium: 9 mg/dL (ref 8.4–10.5)
Chloride: 97 mEq/L (ref 96–112)
Creatinine, Ser: 1.02 mg/dL (ref 0.50–1.35)
GFR calc Af Amer: >90 mL/min (ref >90)
GFR calc non Af Amer: 85 mL/min — ABNORMAL LOW (ref >90)
Glucose, Bld: 113 mg/dL — ABNORMAL HIGH (ref 70–99)
Potassium: 3.8 mEq/L (ref 3.5–5.1)
Sodium: 135 mEq/L (ref 135–145)
Total Bilirubin: 0.3 mg/dL (ref 0.3–1.2)
Total Protein: 6.8 g/dL (ref 6.0–8.3)

## 2012-01-26 MED ORDER — OXYCODONE-ACETAMINOPHEN 5-325 MG PO TABS
ORAL_TABLET | ORAL | Status: AC
  Administered 2012-01-26: 1 via ORAL
  Filled 2012-01-26: qty 1

## 2012-01-26 NOTE — ED Notes (Signed)
Pt given Ginger Ale to drink ok by RN BB

## 2012-01-26 NOTE — ED Provider Notes (Signed)
History     CSN: [id number]  Arrival date & time 01/25/12  2023   First MD Initiated Contact with Patient 01/25/12 2336      Chief Complaint  Patient presents with  . Headache    (Consider location/radiation/quality/duration/timing/severity/associated sxs/prior treatment) HPI The patient reports 2-3 weeks of intermittent low back pain and headache.  He denies weakness of his upper lower extremities.  His had no fevers or chills.  He denies recent tick bites.  His headache comes and goes and seems to be worse with light and loud noises.  He denies nausea and vomiting.  He's never had headaches like this before.  He denies use of anticoagulants.  His had no recent trauma to his head.  His low back pain has bothered him for a long time but seems to be worse over the past one to 2 weeks.  As reported that he had an MRI done last week an outpatient hospital.  The patient was seen by his primary care physician who sent him to the ER recommending "spinal tap".  The patient does not know what the provider was concerned about.  The patient has had Percocet at home for pain he reports that this does improve his pain. Past Medical History  Diagnosis Date  . Hypertension   . Hyperlipidemia     Past Surgical History  Procedure Date  . Inguinal hernia repair     right  . Vasectomy     No family history on file.  History  Substance Use Topics  . Smoking status: Former Smoker    Types: Cigarettes  . Smokeless tobacco: Never Used  . Alcohol Use: 3.0 oz/week    6 drink(s) per week      Review of Systems  All other systems reviewed and are negative.    Allergies  Review of patient's allergies indicates no known allergies.  Home Medications   Current Outpatient Rx  Name Route Sig Dispense Refill  . AMOXICILLIN 875 MG PO TABS Oral Take 875 mg by mouth 2 (two) times daily. Patient started medication 9 days ago from today    . CIPROFLOXACIN HCL 500 MG PO TABS Oral Take 500 mg by  mouth 2 (two) times daily. Patient started medication today on 01-25-12    . CYCLOBENZAPRINE HCL 10 MG PO TABS Oral Take 10 mg by mouth 3 (three) times daily as needed. For pain    . EZETIMIBE 10 MG PO TABS Oral Take 10 mg by mouth daily.    Marland Kitchen HYDROCODONE-ACETAMINOPHEN 5-325 MG PO TABS Oral Take 1 tablet by mouth every 6 (six) hours as needed. For pain    . IBUPROFEN 200 MG PO TABS Oral Take 800 mg by mouth every 2 (two) hours as needed. For pain    . LISINOPRIL-HYDROCHLOROTHIAZIDE 10-12.5 MG PO TABS Oral Take 1 tablet by mouth daily.    . OXYCODONE-ACETAMINOPHEN 5-325 MG PO TABS Oral Take 1 tablet by mouth every 4 (four) hours as needed. For pain      BP 113/69  Pulse 105  Temp(Src) 99.1 F (37.3 C) (Oral)  Resp 20  SpO2 98%  Physical Exam  Nursing note and vitals reviewed. Constitutional: He is oriented to person, place, and time. He appears well-developed and well-nourished.  HENT:  Head: Normocephalic and atraumatic.  Eyes: EOM are normal. Pupils are equal, round, and reactive to light.  Neck: Normal range of motion.  Cardiovascular: Normal rate, regular rhythm, normal heart sounds and intact distal pulses.  Pulmonary/Chest: Effort normal and breath sounds normal. No respiratory distress.  Abdominal: Soft. He exhibits no distension. There is no tenderness.  Musculoskeletal: Normal range of motion.  Neurological: He is alert and oriented to person, place, and time.       5/5 strength in major muscle groups of  bilateral upper and lower extremities. Speech normal. No facial asymetry.   Skin: Skin is warm and dry.  Psychiatric: He has a normal mood and affect. Judgment normal.    ED Course  Procedures (including critical care time)  Labs Reviewed  CBC - Abnormal; Notable for the following:    WBC 11.2 (*)    All other components within normal limits  DIFFERENTIAL - Abnormal; Notable for the following:    Neutrophils Relative 82 (*)    Neutro Abs 9.2 (*)    All other  components within normal limits  BASIC METABOLIC PANEL - Abnormal; Notable for the following:    Sodium 133 (*)    Potassium 3.3 (*)    Glucose, Bld 169 (*)    All other components within normal limits  CBC - Abnormal; Notable for the following:    WBC 11.2 (*)    All other components within normal limits  COMPREHENSIVE METABOLIC PANEL - Abnormal; Notable for the following:    Glucose, Bld 113 (*)    GFR calc non Af Amer 85 (*)    All other components within normal limits   Dg Chest 2 View  01/26/2012  *RADIOLOGY REPORT*  Clinical Data: Fever.  Cough.  Shortness of breath.  Body aches. Hypertension.  CHEST - 2 VIEW  Comparison: None.  Findings: Linear subsegmental atelectasis or scar noted in the lingula.  The lungs appear otherwise clear.  Cardiac and mediastinal contours appear unremarkable.  No pleural effusion noted.  IMPRESSION:  1.  Lingular subsegmental atelectasis or scar.   Otherwise, no significant abnormality identified.  Original Report Authenticated By: Dellia Cloud, M.D.     1. Headache   2. Back pain       MDM  Labs and x-rays were ordered prior to my evaluation as part of nursing triage protocol.  The patient's headache is not concerning by history.  His neuro exam is nonfocal.  He has normal strength in his lower extremities.  His had a normal CT of his head and MRI of his back.  As reported that the patient was sent to the ER by his PA with recommendations for "spinal tap".  Not sure why this is indicated.  I don't believe this to be myasthenia.  I don't believe this to the [facility].  This is not a subarachnoid hemorrhage.  The patient's pain was treated in the emergency department he feels much better at this time.  I recommended follow back up with his doctor later today for repeat evaluation.  If his physician believes the lumbar puncture is indicated and  needs to be performed this can be ordered through interventional radiology and done as an outpatient.         Lyanne Co, MD 01/26/12 [phone]

## 2012-02-29 ENCOUNTER — Ambulatory Visit (HOSPITAL_COMMUNITY)
Admission: RE | Admit: 2012-02-29 | Discharge: 2012-02-29 | Disposition: A | Discharge status: Home / Self Care | Payer: BC Managed Care – PPO | Source: Ambulatory Visit | Attending: Family Medicine | Admitting: Family Medicine

## 2012-02-29 ENCOUNTER — Other Ambulatory Visit: Payer: Self-pay | Admitting: Family Medicine

## 2012-02-29 DIAGNOSIS — R0602 Shortness of breath: Secondary | ICD-10-CM

## 2012-02-29 DIAGNOSIS — R0789 Other chest pain: Secondary | ICD-10-CM | POA: Insufficient documentation

## 2012-02-29 MED ORDER — IOHEXOL 350 MG/ML SOLN
100.0000 mL | Freq: Once | INTRAVENOUS | Status: AC | PRN
  Administered 2012-02-29: 100 mL via INTRAVENOUS

## 2012-03-01 ENCOUNTER — Telehealth: Payer: Self-pay | Admitting: Internal Medicine

## 2012-03-01 NOTE — Telephone Encounter (Signed)
Tammy  One of the ICU nurses Pattricia Boss at [facility] called me today. This patient is a husband to her friend and apparently this friend also works within [employer] system. Patient having abnormal CT findings and lot of weight loss. Apparently they called our office  To see anyone with preference towards me but were told first available with anyone was 04/15/12. I am not sure who they spoke to. But if you can add this patient onto my schedule this week or next week would appreciate it.  Cell [phone]. Home is [phone]  Thanks  MR  Cc: to myself and Carron Curie

## 2012-03-02 NOTE — Telephone Encounter (Signed)
Pt scheduled to see MR 03/10/12 at 9am wife aware of appt. Will forward to MR as Lorain Childes

## 2012-03-03 ENCOUNTER — Ambulatory Visit (INDEPENDENT_AMBULATORY_CARE_PROVIDER_SITE_OTHER): Payer: BC Managed Care – PPO | Admitting: Internal Medicine

## 2012-03-03 ENCOUNTER — Encounter: Payer: Self-pay | Admitting: Internal Medicine

## 2012-03-03 VITALS — BP 128/82 | HR 75 | Temp 98.0°F | Ht 75.0 in | Wt 227.6 lb

## 2012-03-03 DIAGNOSIS — R509 Fever, unspecified: Secondary | ICD-10-CM

## 2012-03-03 DIAGNOSIS — R091 Pleurisy: Secondary | ICD-10-CM

## 2012-03-03 DIAGNOSIS — R634 Abnormal weight loss: Secondary | ICD-10-CM

## 2012-03-03 DIAGNOSIS — J189 Pneumonia, unspecified organism: Secondary | ICD-10-CM

## 2012-03-03 NOTE — Patient Instructions (Addendum)
Most likely what has happened past 10 days is a bacterial pneumonia Please stop cipro Start avelox 400mg  daily x 7 days We need to watch you closely over time for your symptoms and also with imaging to make sure things are clearing up well I recommend you return in 2-3 weeks with CXR followup with me or if I am bookd with my nurse  Practitioner Tammy  I also recommend that you avoid heavy lifting for a week if possible

## 2012-03-03 NOTE — Progress Notes (Signed)
Subjective:    Patient ID: Richard Moore, male    DOB: 03-30-1961, 51 y.o.   MRN: [id number]  HPI  51 year old male. 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  reports that he quit smoking about 4 years ago. His smoking use included Cigarettes. He has a 30 pack-year smoking history. He has never used smokeless tobacco. Body mass index is 28.45 kg/(m^2).   IOV 03/03/2012 Has hx of remote back injury 20 years ago with hx of nsaid use prn chronically. 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 Insidious onset.  Associated with recurrence of chronic low back pain a month ago and some new onset of headaches. Says during MD visits for back ache temp of 99-100 F would be recorded atleast 6-10 times in past 2 months. Since then 30# weight loss but feels past few weeks gaining weight back and back pain resolved. He gives history of being treated with antibiotics x 2 during this time bu denies pneumonia diagnosis but wife sent a slip of paper saying he was diagnosed with pneumonia at this time. 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 And a benign cbc, bmet, and cxr 01/25/12. Also new meds per wife since may are flexeril, mobic and celexa    Reports 10 days ago felt chest cold, with green-brown sputum with new first time pleuritic pain left side. No sick contacts at onset. No fever at onsetThen a few days later started with hemoptysis that lasted few days and resolved 4 days ago on Sunday 02/28/12. On 02/29/12 feeling hot and sweaty and even says passed out and took break on side of the road. Saw PMD and then saw ER: per report CT chest 02/29/12 PE ruled out (not mentioned in official report). Rx with ciproflox. He thinks he is getting some better; pleuritic pain  Better and able to raise left arm but still with pleuritic pain of moderate intensity that is  intermittent and prevents him from taking good deep breath. This pain is worsened by movement or deep inspiration or even walking. Pain improved by hydrocodone or 'chilling out'. Denies syncope, etoh intoxication, aspiration or poor dentition. Still pain significantly better since onset.  He is worried he might have lung cancer  Denies autoimmune symptoms: rash, oral ulcer, photophobia, early morning stiffness. Of note, a 2009 ANA test was negative   CT abdomen lung cut 2009  - clear  CXR 01/25/12   - LLL plate like atelectasis only  Ct Angio Chest W/cm &/or Wo Cm  02/29/2012  *RADIOLOGY REPORT*  Clinical Data: Left rib pain.  CT ANGIOGRAPHY CHEST  Technique:  Multidetector CT imaging of the chest using the standard protocol during bolus administration of intravenous contrast. Multiplanar reconstructed images including MIPs were obtained and reviewed to evaluate the vascular anatomy.  Contrast: OMNIPAQUE IOHEXOL 350 MG/ML SOLN  Comparison: 01/25/2012  Findings: Airspace disease seen within the anterior left upper lobe and in the left lower lobe concerning for pneumonia.  Small left pleural effusion.  Minimal right base atelectasis.  No effusion on the right. Heart is normal size. Aorta is normal caliber. No mediastinal, hilar, or axillary adenopathy.  Visualized thyroid and chest wall soft tissues unremarkable.  Irregular low density area noted in the right hepatic lobe measuring up to 2.7 cm, difficult to characterize, but does  not appear cystic.  No acute findings in the upper abdomen.  No acute bony abnormality.  IMPRESSION: Areas of consolidation involving much of the left lower lobe and also noted in the anterior or left upper lobe, most compatible with pneumonia.  Small left pleural effusion.  Recommend follow up to resolution.  Irregular low density area in the right hepatic lobe which does not appear to be cystic.  This could reflect a hemangioma, but cannot be characterized on this CT  chest.  This can be further evaluated with ultrasound or liver protocol MRI.  Original Report Authenticated By: Cyndie Chime, M.D.   No results found for this basename: HGB:3,HCT:3,WBC:3,PLT:3 in the last 168 hours No results found for this basename: NA:5,K:2,CL:5,CO2:5,GLUCOSE:5,BUN:5,CREATININE:5,CALCIUM:5,MG:5,PHOS:5 in the last 168 hours    Past Medical History  Diagnosis Date  . Hypertension   . Hyperlipidemia   . Pneumonia      Family History  Problem Relation Age of Onset  . Lung cancer Father     23 years ago     History   Social History  . Marital Status: Married    Spouse Name: N/A    Number of Children: N/A  . Years of Education: N/A   Occupational History  . put in elevators    Social History Main Topics  . Smoking status: Former Smoker -- 2.0 packs/day for 15 years    Types: Cigarettes    Quit date: 09/15/2007  . Smokeless tobacco: Never Used  . Alcohol Use: 3.0 oz/week    6 drink(s) per week  . Drug Use: No  . Sexually Active: Not on file   Other Topics Concern  . Not on file   Social History Narrative  . No narrative on file     No Known Allergies   Outpatient Prescriptions Prior to Visit  Medication Sig Dispense Refill  . ciprofloxacin (CIPRO) 500 MG tablet Take 500 mg by mouth 2 (two) times daily. Patient started medication today on 01-25-12      . cyclobenzaprine (FLEXERIL) 10 MG tablet Take 10 mg by mouth at bedtime. For pain      . ezetimibe (ZETIA) 10 MG tablet Take 10 mg by mouth daily.      Marland Kitchen ibuprofen (ADVIL,MOTRIN) 200 MG tablet Take 800 mg by mouth every 2 (two) hours as needed. For pain      . lisinopril-hydrochlorothiazide (PRINZIDE,ZESTORETIC) 10-12.5 MG per tablet Take 1 tablet by mouth daily.      Marland Kitchen amoxicillin (AMOXIL) 875 MG tablet Take 875 mg by mouth 2 (two) times daily. Patient started medication 9 days ago from today      . HYDROcodone-acetaminophen (NORCO) 5-325 MG per tablet Take 1 tablet by mouth every 6 (six) hours as  needed. For pain      . oxyCODONE-acetaminophen (PERCOCET) 5-325 MG per tablet Take 1 tablet by mouth every 4 (four) hours as needed. For pain            Review of Systems  Constitutional: Positive for fever and unexpected weight change.  HENT: Positive for congestion. Negative for ear pain, nosebleeds, sore throat, rhinorrhea, sneezing, trouble swallowing, dental problem, postnasal drip and sinus pressure.   Eyes: Negative for redness and itching.  Respiratory: Positive for cough, chest tightness, shortness of breath and wheezing.   Cardiovascular: Negative for palpitations and leg swelling.  Gastrointestinal: Negative for nausea and vomiting.  Genitourinary: Negative for dysuria.  Musculoskeletal: Negative for joint swelling.  Skin: Negative for rash.  Neurological:  Positive for headaches.  Hematological: Does not bruise/bleed easily.  Psychiatric/Behavioral: Negative for dysphoric mood. The patient is not nervous/anxious.        Objective:   Physical Exam  Nursing note and vitals reviewed. Constitutional: He is oriented to person, place, and time. He appears well-developed and well-nourished. No distress.  HENT:  Head: Normocephalic and atraumatic.  Right Ear: External ear normal.  Left Ear: External ear normal.  Mouth/Throat: Oropharynx is clear and moist. No oropharyngeal exudate.  Eyes: Conjunctivae and EOM are normal. Pupils are equal, round, and reactive to light. Right eye exhibits no discharge. Left eye exhibits no discharge. No scleral icterus.  Neck: Normal range of motion. Neck supple. No JVD present. No tracheal deviation present. No thyromegaly present.  Cardiovascular: Normal rate, regular rhythm and intact distal pulses.  Exam reveals no gallop and no friction rub.   No murmur heard. Pulmonary/Chest: Effort normal. No respiratory distress. He has no wheezes. He has rales. He exhibits tenderness.       Left base is dull to percussion, decreased vocal fremitus and  also focal crackles with diminishd air entry  Abdominal: Soft. Bowel sounds are normal. He exhibits no distension and no mass. There is no tenderness. There is no rebound and no guarding.  Musculoskeletal: Normal range of motion. He exhibits no edema and no tenderness.  Lymphadenopathy:    He has no cervical adenopathy.  Neurological: He is alert and oriented to person, place, and time. He has normal reflexes. No cranial nerve deficit. Coordination normal.  Skin: Skin is warm and dry. No rash noted. He is not diaphoretic. No erythema. No pallor.  Psychiatric: He has a normal mood and affect. His behavior is normal. Judgment and thought content normal.          Assessment & Plan:

## 2012-03-04 DIAGNOSIS — R509 Fever, unspecified: Secondary | ICD-10-CM | POA: Insufficient documentation

## 2012-03-04 DIAGNOSIS — J189 Pneumonia, unspecified organism: Secondary | ICD-10-CM | POA: Insufficient documentation

## 2012-03-04 DIAGNOSIS — R091 Pleurisy: Secondary | ICD-10-CM | POA: Insufficient documentation

## 2012-03-04 DIAGNOSIS — R634 Abnormal weight loss: Secondary | ICD-10-CM | POA: Insufficient documentation

## 2012-03-04 NOTE — Assessment & Plan Note (Signed)
He clearly has had acute pleurisy psat 10 days. DDx is CAP v PE. HE says PE was ruled out on 02/29/12 CT chest but there is no mention of this in official report. I will get this clarrified. I do think hx is suggestive of autoimmune process (2009 ANA negaive) but if persists will test for autoimmune. I am worried about CAP cauing pleurisy. He is better but on 03/03/12 exam still has positive findings in LLL. I Warned him about failure to resolve, empyema development. Will have him see NP in 2-3 weeks from now with CXR

## 2012-03-04 NOTE — Assessment & Plan Note (Signed)
His most recent 10 day ilness sounds c/w CAP. He is on cipro and is significantly better. I will change him to 7 day avelox (Samples given) and follow him in few weeks with cxr

## 2012-03-04 NOTE — Assessment & Plan Note (Signed)
Was ongoing for 2 months before he developed LLL CAP. He needs to be closely monitored for recurrence of this. I am not sure what is going on but if it recurs needs HIV and autoimmune check

## 2012-03-10 ENCOUNTER — Institutional Professional Consult (permissible substitution): Payer: BC Managed Care – PPO | Admitting: Internal Medicine

## 2012-03-24 ENCOUNTER — Ambulatory Visit (INDEPENDENT_AMBULATORY_CARE_PROVIDER_SITE_OTHER): Payer: BC Managed Care – PPO | Admitting: Adult Health

## 2012-03-24 ENCOUNTER — Encounter: Payer: Self-pay | Admitting: Adult Health

## 2012-03-24 ENCOUNTER — Ambulatory Visit (INDEPENDENT_AMBULATORY_CARE_PROVIDER_SITE_OTHER)
Admission: RE | Admit: 2012-03-24 | Discharge: 2012-03-24 | Disposition: A | Discharge status: Home / Self Care | Payer: BC Managed Care – PPO | Source: Ambulatory Visit | Attending: Adult Health | Admitting: Adult Health

## 2012-03-24 VITALS — BP 144/86 | HR 71 | Temp 97.5°F | Ht 75.0 in | Wt 231.8 lb

## 2012-03-24 DIAGNOSIS — J189 Pneumonia, unspecified organism: Secondary | ICD-10-CM

## 2012-03-24 NOTE — Patient Instructions (Addendum)
Continue on current regimen.  follow up Dr. Marchelle Gearing in 4-6 weeks   Late add :  Will need xray on return

## 2012-03-29 NOTE — Assessment & Plan Note (Signed)
Clinically much improved w/ abx  Check xray today  Follow up Dr. Marchelle Gearing in 6 weeks and As needed

## 2012-03-29 NOTE — Progress Notes (Signed)
Subjective:    Patient ID: Richard Moore, male    DOB: 08/27/1961, 51 y.o.   MRN: [id number]  HPI 51 year old male.    IOV 03/03/2012 Has hx of remote back injury 20 years ago with hx of nsaid use prn chronically. 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 Insidious onset.  Associated with recurrence of chronic low back pain a month ago and some new onset of headaches. Says during MD visits for back ache temp of 99-100 F would be recorded atleast 6-10 times in past 2 months. Since then 30# weight loss but feels past few weeks gaining weight back and back pain resolved. He gives history of being treated with antibiotics x 2 during this time bu denies pneumonia diagnosis but wife sent a slip of paper saying he was diagnosed with pneumonia at this time. 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 And a benign cbc, bmet, and cxr 01/25/12. Also new meds per wife since may are flexeril, mobic and celexa    Reports 10 days ago felt chest cold, with green-brown sputum with new first time pleuritic pain left side. No sick contacts at onset. No fever at onsetThen a few days later started with hemoptysis that lasted few days and resolved 4 days ago on Sunday 02/28/12. On 02/29/12 feeling hot and sweaty and even says passed out and took break on side of the road. Saw PMD and then saw ER: per report CT chest 02/29/12 PE ruled out (not mentioned in official report). Rx with ciproflox. He thinks he is getting some better; pleuritic pain  Better and able to raise left arm but still with pleuritic pain of moderate intensity that is intermittent and prevents him from taking good deep breath. This pain is worsened by movement or deep inspiration or even walking. Pain improved by hydrocodone or 'chilling out'. Denies syncope, etoh intoxication, aspiration or poor dentition. Still pain significantly  better since onset.  He is worried he might have lung cancer  Denies autoimmune symptoms: rash, oral ulcer, photophobia, early morning stiffness. Of note, a 2009 ANA test was negative   CT abdomen lung cut 2009  - clear  CXR 01/25/12   - LLL plate like atelectasis only  Ct Angio Chest W/cm &/or Wo Cm  02/29/2012  *RADIOLOGY REPORT*  Clinical Data: Left rib pain.  CT ANGIOGRAPHY CHEST  Technique:  Multidetector CT imaging of the chest using the standard protocol during bolus administration of intravenous contrast. Multiplanar reconstructed images including MIPs were obtained and reviewed to evaluate the vascular anatomy.  Contrast: OMNIPAQUE IOHEXOL 350 MG/ML SOLN  Comparison: 01/25/2012  Findings: Airspace disease seen within the anterior left upper lobe and in the left lower lobe concerning for pneumonia.  Small left pleural effusion.  Minimal right base atelectasis.  No effusion on the right. Heart is normal size. Aorta is normal caliber. No mediastinal, hilar, or axillary adenopathy.  Visualized thyroid and chest wall soft tissues unremarkable.  Irregular low density area noted in the right hepatic lobe measuring up to 2.7 cm, difficult to characterize, but does not appear cystic.  No acute findings in the upper abdomen.  No acute bony abnormality.  IMPRESSION: Areas of consolidation involving much of the left lower lobe and also noted in the anterior or left upper lobe, most compatible with pneumonia.  Small left pleural effusion.  Recommend follow up to resolution.  Irregular low density area in the right hepatic lobe which does not appear to be cystic.  This could reflect a hemangioma, but cannot be characterized on this CT chest.  This can be further evaluated with ultrasound or liver protocol MRI.  Original Report Authenticated By: Cyndie Chime, M.D.   >>tx w/   03/24/12 Follow up  2 week follow up PNA - reports symptoms have resolved. Feels so much better  Found to have PNA last ov  on CT , tx w/ 7 d of Avelox  Decreased cough and congestion , no fever  No hemoptysis .    Past Medical History  Diagnosis Date  . Hypertension   . Hyperlipidemia   . Pneumonia      Family History  Problem Relation Age of Onset  . Lung cancer Father     23 years ago     History   Social History  . Marital Status: Married    Spouse Name: N/A    Number of Children: N/A  . Years of Education: N/A   Occupational History  . put in elevators    Social History Main Topics  . Smoking status: Former Smoker -- 2.0 packs/day for 15 years    Types: Cigarettes    Quit date: 09/15/2007  . Smokeless tobacco: Never Used  . Alcohol Use: 3.0 oz/week    6 drink(s) per week  . Drug Use: No  . Sexually Active: Not on file   Other Topics Concern  . Not on file   Social History Narrative  . No narrative on file     No Known Allergies   Outpatient Prescriptions Prior to Visit  Medication Sig Dispense Refill  . ciprofloxacin (CIPRO) 500 MG tablet Take 500 mg by mouth 2 (two) times daily. Patient started medication today on 01-25-12      . cyclobenzaprine (FLEXERIL) 10 MG tablet Take 10 mg by mouth at bedtime. For pain      . ezetimibe (ZETIA) 10 MG tablet Take 10 mg by mouth daily.      Marland Kitchen ibuprofen (ADVIL,MOTRIN) 200 MG tablet Take 800 mg by mouth every 2 (two) hours as needed. For pain      . lisinopril-hydrochlorothiazide (PRINZIDE,ZESTORETIC) 10-12.5 MG per tablet Take 1 tablet by mouth daily.      Marland Kitchen amoxicillin (AMOXIL) 875 MG tablet Take 875 mg by mouth 2 (two) times daily. Patient started medication 9 days ago from today      . HYDROcodone-acetaminophen (NORCO) 5-325 MG per tablet Take 1 tablet by mouth every 6 (six) hours as needed. For pain      . oxyCODONE-acetaminophen (PERCOCET) 5-325 MG per tablet Take 1 tablet by mouth every 4 (four) hours as needed. For pain            Review of Systems  Constitutional:   No  weight loss, night sweats,  Fevers, chills, ++  fatigue, or  lassitude.  HEENT:   No headaches,  Difficulty swallowing,  Tooth/dental problems, or  Sore throat,                No sneezing, itching, ear ache, nasal congestion, post nasal drip,   CV:  No chest pain,  Orthopnea, PND, swelling in lower extremities, anasarca, dizziness, palpitations, syncope.   GI  No heartburn, indigestion, abdominal pain, nausea, vomiting, diarrhea, change in bowel habits, loss of appetite, bloody stools.   Resp: No shortness of breath with exertion or  at rest.  No excess mucus, no productive cough,  No non-productive cough,  No coughing up of blood.  No change in color of mucus.  No wheezing.  No chest wall deformity  Skin: no rash or lesions.  GU: no dysuria, change in color of urine, no urgency or frequency.  No flank pain, no hematuria   MS:  No joint pain or swelling.  No decreased range of motion.     Psych:  No change in mood or affect. No depression or anxiety.  No memory loss.         Objective:   GEN: A/Ox3; pleasant , NAD  HEENT:  [place]/AT,  EACs-clear, TMs-wnl, NOSE-clear, THROAT-clear, no lesions, no postnasal drip or exudate noted.   NECK:  Supple w/ fair ROM; no JVD; normal carotid impulses w/o bruits; no thyromegaly or nodules palpated; no lymphadenopathy.  RESP  Clear  P & A; w/o, wheezes/ rales/ or rhonchi.no accessory muscle use, no dullness to percussion  CARD:  RRR, no m/r/g  , no peripheral edema, pulses intact, no cyanosis or clubbing.  GI:   Soft & nt; nml bowel sounds; no organomegaly or masses detected.  Musco: Warm bil, no deformities or joint swelling noted.   Neuro: alert, no focal deficits noted.    Skin: Warm, no lesions or rashes        Assessment & Plan:

## 2012-03-30 ENCOUNTER — Telehealth: Payer: Self-pay | Admitting: Internal Medicine

## 2012-03-30 DIAGNOSIS — J189 Pneumonia, unspecified organism: Secondary | ICD-10-CM

## 2012-03-30 NOTE — Telephone Encounter (Signed)
Pt returned my call regarding date for repeat cxr. After looking at the dates, July 29th is too soon. Pt agrees to come for repeat cxr on Aug 5th.

## 2012-04-18 ENCOUNTER — Ambulatory Visit (INDEPENDENT_AMBULATORY_CARE_PROVIDER_SITE_OTHER)
Admission: RE | Admit: 2012-04-18 | Discharge: 2012-04-18 | Disposition: A | Discharge status: Home / Self Care | Payer: BC Managed Care – PPO | Source: Ambulatory Visit | Attending: Adult Health | Admitting: Adult Health

## 2012-04-18 DIAGNOSIS — J189 Pneumonia, unspecified organism: Secondary | ICD-10-CM

## 2012-06-03 ENCOUNTER — Ambulatory Visit: Payer: BC Managed Care – PPO | Admitting: Internal Medicine

## 2012-06-22 ENCOUNTER — Ambulatory Visit (INDEPENDENT_AMBULATORY_CARE_PROVIDER_SITE_OTHER): Payer: BC Managed Care – PPO | Admitting: Internal Medicine

## 2012-06-22 ENCOUNTER — Encounter: Payer: Self-pay | Admitting: Internal Medicine

## 2012-06-22 VITALS — BP 118/82 | HR 81 | Temp 98.0°F | Ht 75.0 in | Wt 229.8 lb

## 2012-06-22 DIAGNOSIS — J189 Pneumonia, unspecified organism: Secondary | ICD-10-CM

## 2012-06-22 NOTE — Patient Instructions (Addendum)
Glad you are doing well Please strongly consider having flu shot that I strongly recommend I will discuss with radiologist but most likely you do not need another scan or xray or followup

## 2012-06-22 NOTE — Progress Notes (Signed)
Subjective:    Patient ID: CAP MASSI, male    DOB: 1961-07-15, 51 y.o.   MRN: [id number]  HPI   51 year old male. 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  reports that he quit smoking about 4 years ago. His smoking use included Cigarettes. He has a 30 pack-year smoking history. Body mass index is 28.45 kg/(m^2).   IOV 03/03/2012 Has hx of remote back injury 20 years ago with hx of nsaid use prn chronically. 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 Insidious onset.  Associated with recurrence of chronic low back pain a month ago and some new onset of headaches. Says during MD visits for back ache temp of 99-100 F would be recorded atleast 6-10 times in past 2 months. Since then 30# weight loss but feels past few weeks gaining weight back and back pain resolved. He gives history of being treated with antibiotics x 2 during this time bu denies pneumonia diagnosis but wife sent a slip of paper saying he was diagnosed with pneumonia at this time. 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 And a benign cbc, bmet, and cxr 01/25/12. Also new meds per wife since may are flexeril, mobic and celexa    Reports 10 days ago felt chest cold, with green-brown sputum with new first time pleuritic pain left side. No sick contacts at onset. No fever at onsetThen a few days later started with hemoptysis that lasted few days and resolved 4 days ago on Sunday 02/28/12. On 02/29/12 feeling hot and sweaty and even says passed out and took break on side of the road. Saw PMD and then saw ER: per report CT chest 02/29/12 PE ruled out (not mentioned in official report). Rx with ciproflox. He thinks he is getting some better; pleuritic pain  Better and able to raise left arm but still with pleuritic pain of moderate intensity that is intermittent and prevents him from  taking good deep breath. This pain is worsened by movement or deep inspiration or even walking. Pain improved by hydrocodone or 'chilling out'. Denies syncope, etoh intoxication, aspiration or poor dentition. Still pain significantly better since onset.  He is worried he might have lung cancer  Denies autoimmune symptoms: rash, oral ulcer, photophobia, early morning stiffness. Of note, a 2009 ANA test was negative   CT abdomen lung cut 2009  - clear  CXR 01/25/12   - LLL plate like atelectasis only  Ct Angio Chest W/cm &/or Wo Cm  02/29/2012  *RADIOLOGY REPORT*  Clinical Data: Left rib pain.  CT ANGIOGRAPHY CHEST  Technique:  Multidetector CT imaging of the chest using the standard protocol during bolus administration of intravenous contrast. Multiplanar reconstructed images including MIPs were obtained and reviewed to evaluate the vascular anatomy.  Contrast: OMNIPAQUE IOHEXOL 350 MG/ML SOLN  Comparison: 01/25/2012  Findings: Airspace disease seen within the anterior left upper lobe and in the left lower lobe concerning for pneumonia.  Small left pleural effusion.  Minimal right base atelectasis.  No effusion on the right. Heart is normal size. Aorta is normal caliber. No mediastinal, hilar, or axillary adenopathy.  Visualized thyroid and chest wall soft tissues unremarkable.  Irregular low density area noted in the right hepatic lobe measuring up to 2.7 cm, difficult to characterize, but does not appear cystic.  No  acute findings in the upper abdomen.  No acute bony abnormality.  IMPRESSION: Areas of consolidation involving much of the left lower lobe and also noted in the anterior or left upper lobe, most compatible with pneumonia.  Small left pleural effusion.  Recommend follow up to resolution.  Irregular low density area in the right hepatic lobe which does not appear to be cystic.  This could reflect a hemangioma, but cannot be characterized on this CT chest.  This can be further evaluated  with ultrasound or liver protocol MRI.  Original Report Authenticated By: Cyndie Chime, M.D.    REC  Most likely what has happened past 10 days is a bacterial pneumonia Please stop cipro Start avelox 400mg  daily x 7 days We need to watch you closely over time for your symptoms and also with imaging to make sure things are clearing up well I recommend you return in 2-3 weeks with CXR followup with me or if I am bookd with my nurse  Practitioner Tammy  I also recommend that you avoid heavy lifting for a week if possible       03/24/12 Follow up  2 week follow up PNA - reports symptoms have resolved. Feels so much better  Found to have PNA last ov on CT , tx w/ 7 d of Avelox  Decreased cough and congestion , no fever  No hemoptysis .   OV 06/22/2012    Followup post pneumonia. Clinically completely well. BAck to baseline. Denies any resp complaints. CXR 04/28/12 - reported as complete resolution although to me looks similar to 03/24/12. Smoking still remission. Refused flu shot. No new issuesReview of Systems  Constitutional: Negative for fever, chills, diaphoresis, activity change, appetite change, fatigue and unexpected weight change.  HENT: Negative for hearing loss, ear pain, nosebleeds, congestion, sore throat, facial swelling, rhinorrhea, sneezing, mouth sores, trouble swallowing, neck pain, neck stiffness, dental problem, voice change, postnasal drip, sinus pressure, tinnitus and ear discharge.   Eyes: Negative for photophobia, discharge, itching and visual disturbance.  Respiratory: Negative for apnea, cough, choking, chest tightness, shortness of breath, wheezing and stridor.   Cardiovascular: Negative for chest pain, palpitations and leg swelling.  Gastrointestinal: Negative for nausea, vomiting, abdominal pain, constipation, blood in stool and abdominal distention.  Genitourinary: Negative for dysuria, urgency, frequency, hematuria, flank pain, decreased urine volume and  difficulty urinating.  Musculoskeletal: Negative for myalgias, back pain, joint swelling, arthralgias and gait problem.  Skin: Negative for color change, pallor and rash.  Neurological: Negative for dizziness, tremors, seizures, syncope, speech difficulty, weakness, light-headedness, numbness and headaches.  Hematological: Negative for adenopathy. Does not bruise/bleed easily.  Psychiatric/Behavioral: Negative for confusion, disturbed wake/sleep cycle and agitation. The patient is not nervous/anxious.        Objective:   Physical Exam  Nursing note and vitals reviewed. Constitutional: He is oriented to person, place, and time. He appears well-developed and well-nourished. No distress.  HENT:  Head: Normocephalic and atraumatic.  Right Ear: External ear normal.  Left Ear: External ear normal.  Mouth/Throat: Oropharynx is clear and moist. No oropharyngeal exudate.  Eyes: Conjunctivae normal and EOM are normal. Pupils are equal, round, and reactive to light. Right eye exhibits no discharge. Left eye exhibits no discharge. No scleral icterus.  Neck: Normal range of motion. Neck supple. No JVD present. No tracheal deviation present. No thyromegaly present.  Cardiovascular: Normal rate, regular rhythm and intact distal pulses.  Exam reveals no gallop and no friction rub.   No murmur  heard. Pulmonary/Chest: Effort normal and breath sounds normal. No respiratory distress. He has no wheezes. He has no rales. He exhibits no tenderness.  Abdominal: Soft. Bowel sounds are normal. He exhibits no distension and no mass. There is no tenderness. There is no rebound and no guarding.  Musculoskeletal: Normal range of motion. He exhibits no edema and no tenderness.  Lymphadenopathy:    He has no cervical adenopathy.  Neurological: He is alert and oriented to person, place, and time. He has normal reflexes. No cranial nerve deficit. Coordination normal.  Skin: Skin is warm and dry. No rash noted. He is not  diaphoretic. No erythema. No pallor.  Psychiatric: He has a normal mood and affect. His behavior is normal. Judgment and thought content normal.          Assessment & Plan:

## 2012-06-26 NOTE — Assessment & Plan Note (Signed)
Clinically resolved. Asymptomatic. CXR 04/18/12 reported as total resolution but will double check with radiology

## 2012-07-22 ENCOUNTER — Ambulatory Visit: Payer: BC Managed Care – PPO | Attending: Family Medicine | Admitting: Sleep Medicine

## 2012-07-22 DIAGNOSIS — G4733 Obstructive sleep apnea (adult) (pediatric): Secondary | ICD-10-CM | POA: Insufficient documentation

## 2012-07-22 DIAGNOSIS — G4730 Sleep apnea, unspecified: Secondary | ICD-10-CM

## 2012-07-23 NOTE — Procedures (Signed)
[facility] Diago Haik A. Gerilyn Pilgrim, MD     [email or web address]        NAMELAMEEK, GOWER             ACCOUNT NO.:  [id number]  MEDICAL RECORD NO.:  [id number]          PATIENT TYPE:  OUT  LOCATION:  SLEEP LAB                     FACILITY:  [facility]  PHYSICIAN:  Brogen Duell A. Gerilyn Pilgrim, M.D. DATE OF BIRTH:  03/15/1961  DATE OF STUDY:  07/22/2012                           NOCTURNAL POLYSOMNOGRAM  REFERRING PHYSICIAN:  Ernestina Penna, M.D.  INDICATION:  A 51 year old man who presents with hypersomnia, fatigue, and snoring.  The study is being done to evaluate for obstructive sleep apnea syndrome.  MEDICATIONS:  Lisinopril.  EPWORTH SLEEPINESS SCALE:  13.  BMI 27.  ARCHITECTURAL SUMMARY:  This is a split night recording with initial portion being a diagnostic and the second portion being a titration recording.  The total recording time is 422 minutes.  Sleep efficiency 88%.  Sleep latency 1 minute.  REM latency 138 minutes.  RESPIRATORY SUMMARY:  Baseline oxygen saturation is 95, lowest saturation 81 during non-REM sleep.  Diagnostic AHI is 68.  The patient was placed on positive pressure between 5 - 7.  The patient had difficulties with titration as he woke up and panic, but he actually seemed to have done fairly well on a pressure of 7, he was on 7 close to 70 minutes and AHI at that level was 1.1.  REM sleep was not achieved, however, at that level.  However, the diagnostic study showed most of the events occurred during with during non-REM sleep with the REM index of actually problem 1.  LIMB MOVEMENT SUMMARY:  PLM index is 0.  ELECTROCARDIOGRAM SUMMARY:  Average heart rate is 62 with no significant dysrhythmias observed.  IMPRESSION:  Severe obstructive sleep apnea syndrome which responds well to a CPAP of 7.  Thanks for this referral.    Aurel Nguyen A. Gerilyn Pilgrim, M.D.    KAD/MEDQ  D:  07/23/2012 13:26:28  T:  07/23/2012 14:20:46  Job:  [id number]

## 2013-01-14 ENCOUNTER — Ambulatory Visit (INDEPENDENT_AMBULATORY_CARE_PROVIDER_SITE_OTHER): Payer: BC Managed Care – PPO | Admitting: Nurse Practitioner

## 2013-01-14 VITALS — BP 140/91 | HR 71 | Temp 98.2°F | Wt 234.5 lb

## 2013-01-14 DIAGNOSIS — R059 Cough, unspecified: Secondary | ICD-10-CM

## 2013-01-14 DIAGNOSIS — J209 Acute bronchitis, unspecified: Secondary | ICD-10-CM

## 2013-01-14 DIAGNOSIS — R05 Cough: Secondary | ICD-10-CM

## 2013-01-14 LAB — POCT INFLUENZA A/B
Influenza A, POC: NEGATIVE
Influenza B, POC: NEGATIVE

## 2013-01-14 MED ORDER — AZITHROMYCIN 250 MG PO TABS: ORAL_TABLET | ORAL | Status: DC

## 2013-01-14 MED ORDER — HYDROCODONE-HOMATROPINE 5-1.5 MG/5ML PO SYRP: 5.0000 mL | ORAL_SOLUTION | Freq: Three times a day (TID) | ORAL | Status: DC | PRN

## 2013-01-14 NOTE — Patient Instructions (Signed)

## 2013-01-14 NOTE — Progress Notes (Signed)
  Subjective:    Patient ID: Richard Moore, male    DOB: 23-Oct-1960, 52 y.o.   MRN: [id number]  HPI  Patient in C/O cough. His daughter tested positive for flu B last week. Now he has a cough.    Review of Systems  Constitutional: Negative for fever and chills.  HENT: Positive for congestion, rhinorrhea, sneezing and postnasal drip. Negative for ear pain.   Respiratory: Positive for cough.        Objective:   Physical Exam  Constitutional: He is oriented to person, place, and time. He appears well-developed and well-nourished.  HENT:  Right Ear: Hearing, tympanic membrane, external ear and ear canal normal.  Left Ear: Hearing, tympanic membrane, external ear and ear canal normal.  Nose: Mucosal edema and rhinorrhea present.  Mouth/Throat: Mucous membranes are not pale. Posterior oropharyngeal edema present.  Eyes: Conjunctivae and EOM are normal. Pupils are equal, round, and reactive to light.  Neck: Normal range of motion. Neck supple.  Cardiovascular: Normal rate and normal heart sounds.   Pulmonary/Chest: Effort normal and breath sounds normal.  Lymphadenopathy:    He has no cervical adenopathy.  Neurological: He is alert and oriented to person, place, and time.  Psychiatric: He has a normal mood and affect. His behavior is normal. Judgment and thought content normal.  BP 140/91  Pulse 71  Temp(Src) 98.2 F (36.8 C) (Oral)  Wt 234 lb 8 oz (106.369 kg)  BMI 29.31 kg/m2 Results for orders placed in visit on 01/14/13  POCT INFLUENZA A/B      Result Value Range   Influenza A, POC Negative     Influenza B, POC Negative            Assessment & Plan:  1. Cough  - POCT Influenza A/B  2. Acute bronchitis 1. Take meds as prescribed 2. Use a cool mist humidifier especially during the winter months and when heat has  been humid. 3. Use saline nose sprays frequently 4. Saline irrigations of the nose can be very helpful if done frequently.  * 4X daily for 1 week*  *  Use of a nettie pot can be helpful with this. Follow directions with this* 5. Drink plenty of fluids 6. Keep thermostat turn down low 7.For any cough or congestion  Use plain Mucinex- regular strength or max strength is fine   * Children- consult with Pharmacist for dosing 8. For fever or aces or pains- take tylenol or ibuprofen appropriate for age and weight.  * for fevers greater than 101 orally you may alternate ibuprofen and tylenol every  3 hours.   - azithromycin (ZITHROMAX Z-PAK) 250 MG tablet; As directed  Dispense: 6 each; Refill: 0 - HYDROcodone-homatropine (HYCODAN) 5-1.5 MG/5ML syrup; Take 5 mLs by mouth every 8 (eight) hours as needed for cough.  Dispense: 120 mL; Refill: 0  Mary-Margaret Daphine Deutscher, FNP

## 2013-03-27 IMAGING — CT CT HEAD W/O CM
1 series · 16 of 30 positions shown, 20 images · non-contrast
Comparison: None.

CLINICAL DATA: Headache for 3 days, hypertension, denies injury

CT HEAD WITHOUT CONTRAST
TECHNIQUE: Contiguous axial images were obtained from the base of
the skull through the vertex without contrast.

[Series 2: headseq 4.8 h37s · axial · 0.48mm/px · z∈[+142,+297]mm · 16 of 36 slices shown, 20 images]
[im 2/36  brain]
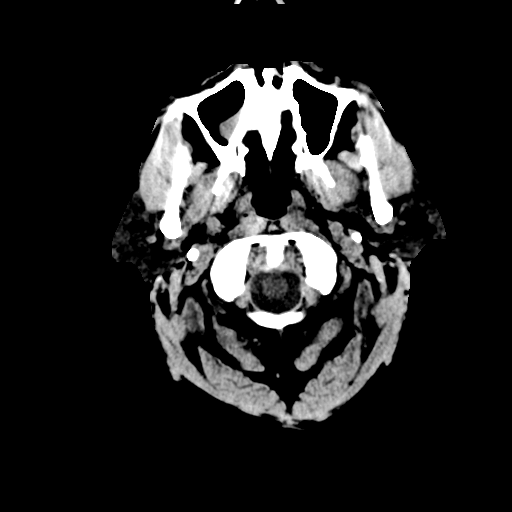
[im 2/36  bone]
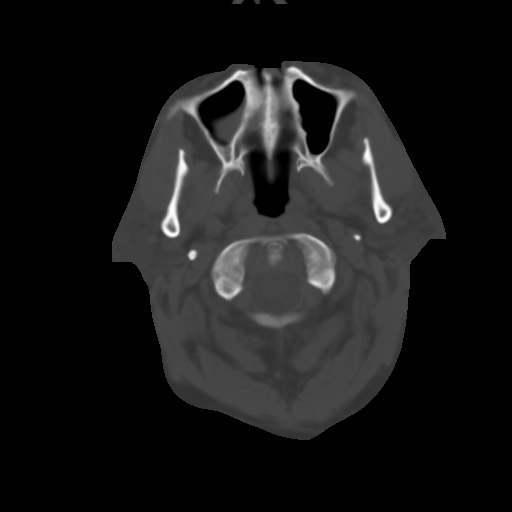
[im 4/36  brain]
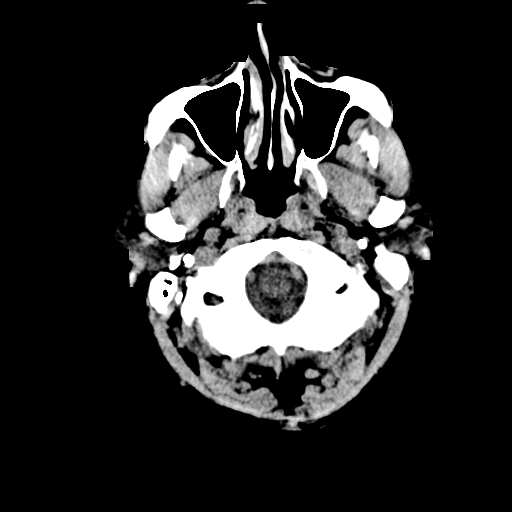
[im 7/36  brain]
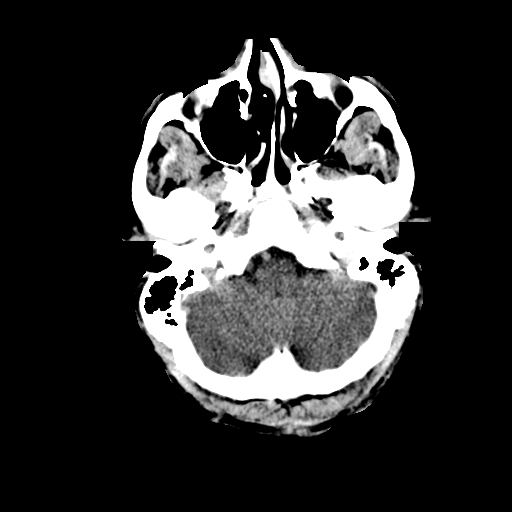
[im 9/36  brain]
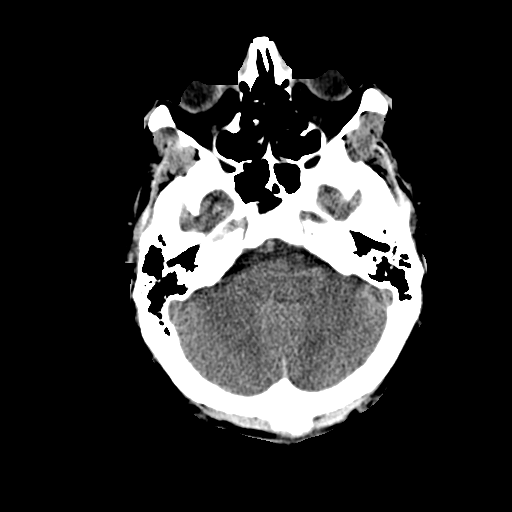
[im 10/36  brain]
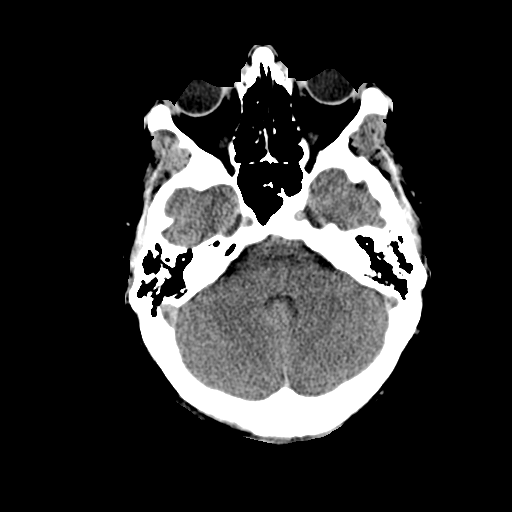
[im 10/36  bone]
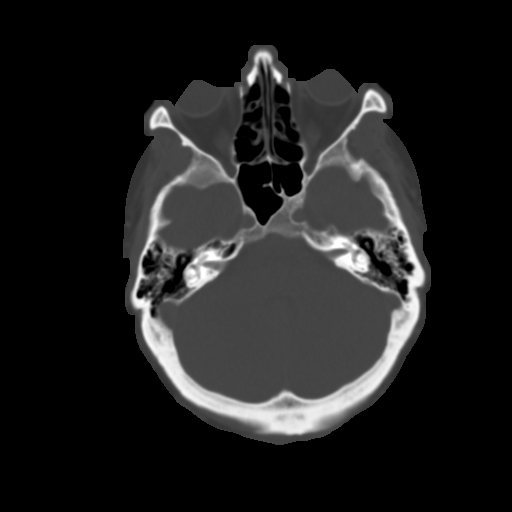
[im 13/36  brain]
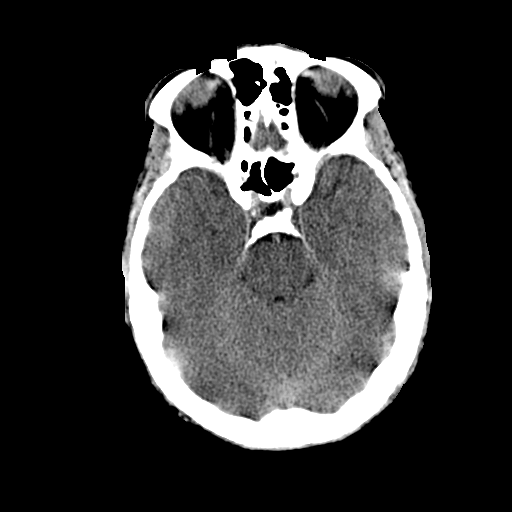
[im 15/36  brain]
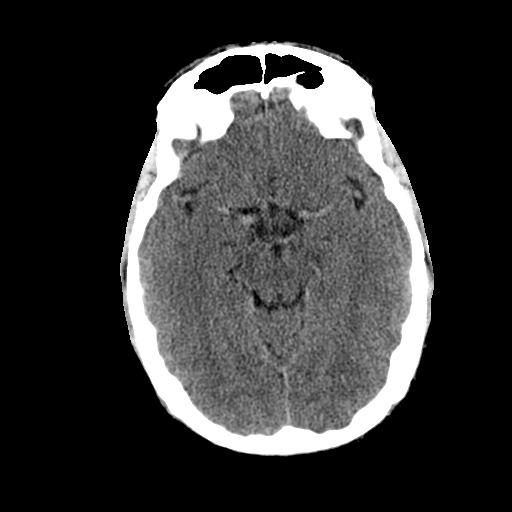
[im 17/36  brain]
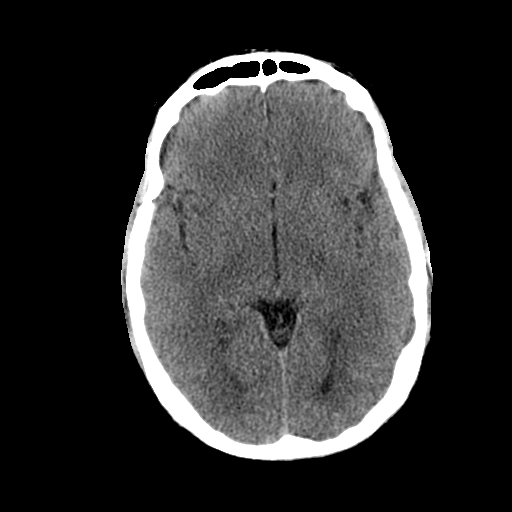
[im 19/36  brain]
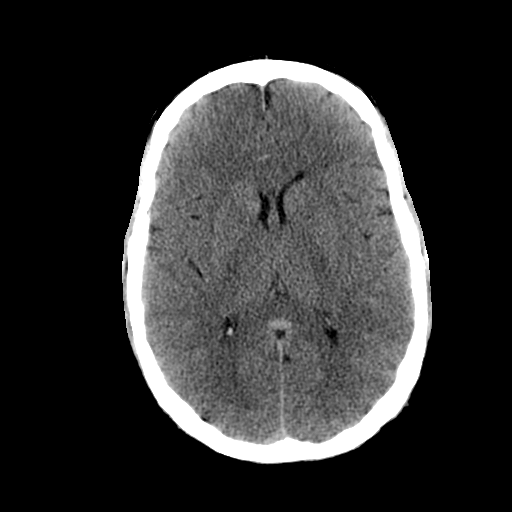
[im 19/36  bone]
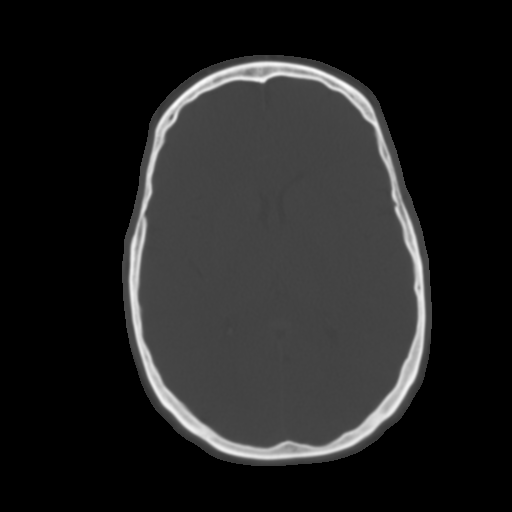
[im 21/36  brain]
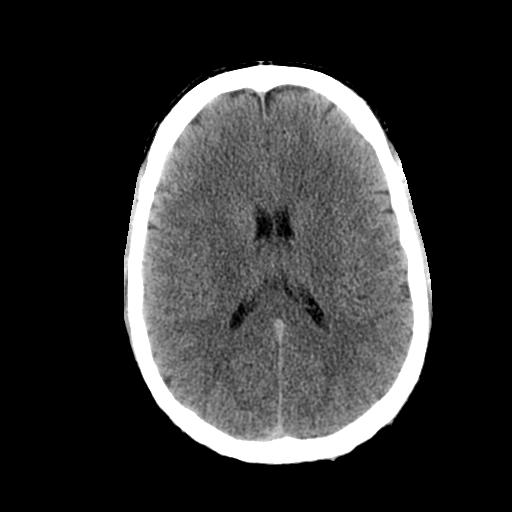
[im 23/36  brain]
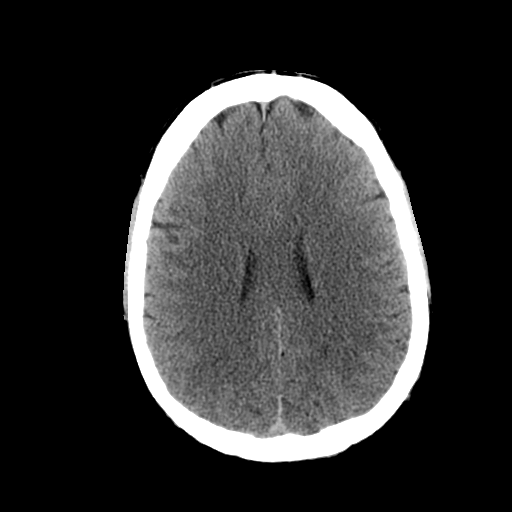
[im 26/36  brain]
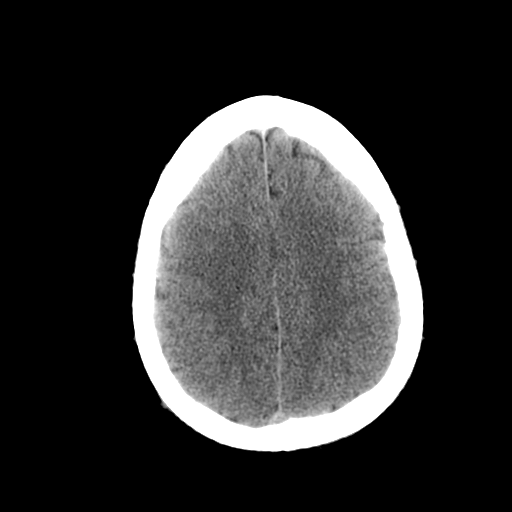
[im 27/36  brain]
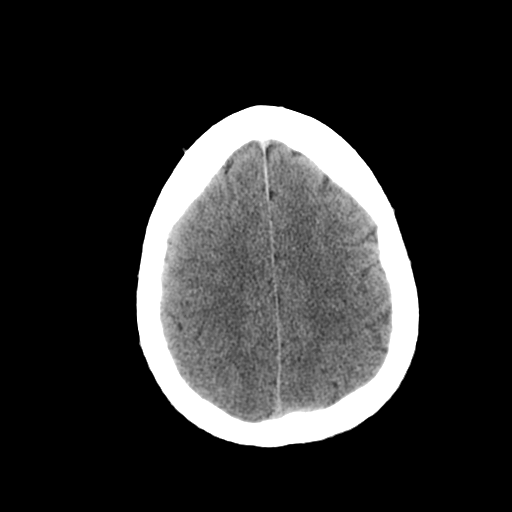
[im 27/36  bone]
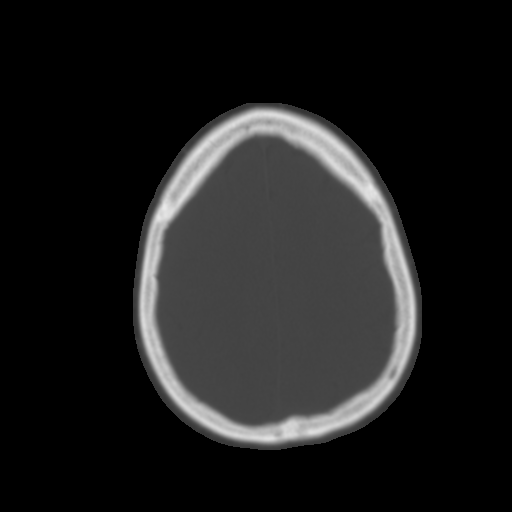
[im 29/36  brain]
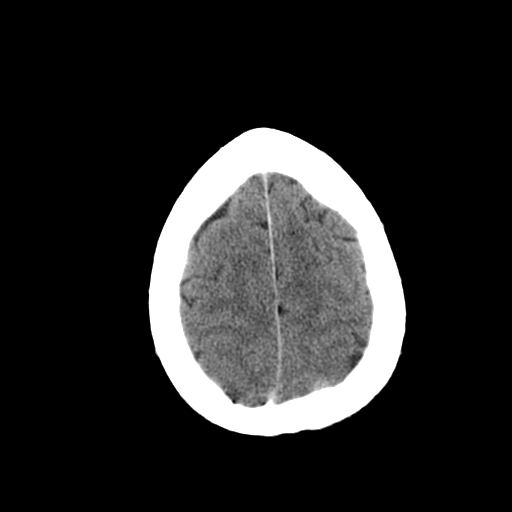
[im 32/36  brain]
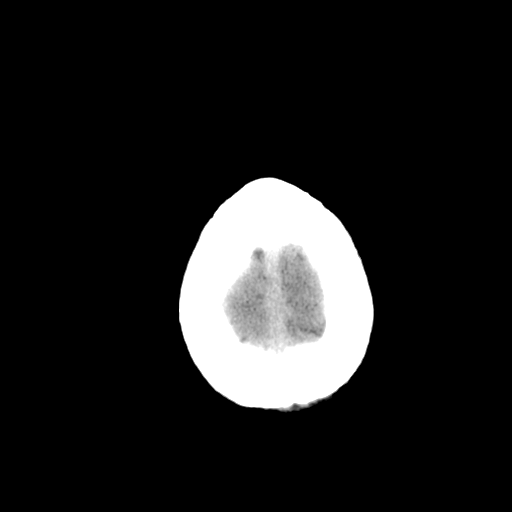
[im 34/36  brain]
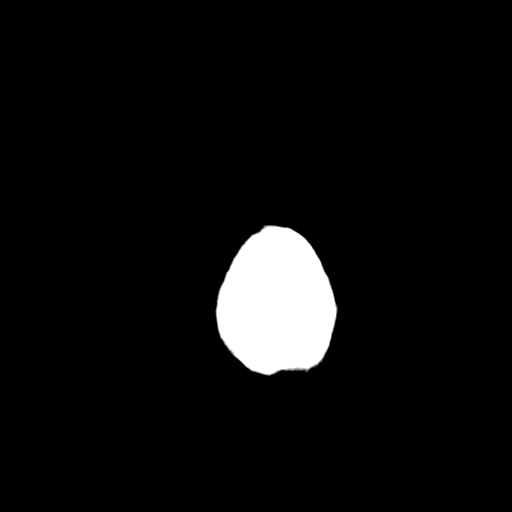

[16 of 30 positions shown; findings below may reference images not displayed]

FINDINGS: There is overall increased attenuation of the intracranial
vasculature most suggestive of a hypovolemic state.  Gray white
differentiation is maintained.  No CT evidence of acute large
territory infarct.  No intraparenchymal or extra-axial mass or
hemorrhage.  Normal size and configuration of the ventricles and
basilar cisterns.  No midline shift.  Minimal mucosal thickening
within the right maxillary sinus.  The remaining paranasal sinuses
and mastoid air cells are normal.  Incidental note is made of
leftward nasal septal deviation.  Regional soft tissues are normal.
No displaced calvarial fracture.
IMPRESSION: 1.  Negative noncontrast head CT.
2.  Overall increased attenuation of the intracranial vasculature
suggest hypovolemic state.
3.  Mucosal thickening within the right maxillary sinus.

## 2013-06-30 ENCOUNTER — Encounter: Payer: Self-pay | Admitting: Family Medicine

## 2013-06-30 ENCOUNTER — Ambulatory Visit (INDEPENDENT_AMBULATORY_CARE_PROVIDER_SITE_OTHER): Payer: BC Managed Care – PPO

## 2013-06-30 ENCOUNTER — Ambulatory Visit (INDEPENDENT_AMBULATORY_CARE_PROVIDER_SITE_OTHER): Payer: BC Managed Care – PPO | Admitting: Family Medicine

## 2013-06-30 VITALS — BP 133/83 | HR 89 | Temp 98.0°F | Ht 75.0 in | Wt 232.0 lb

## 2013-06-30 DIAGNOSIS — R05 Cough: Secondary | ICD-10-CM

## 2013-06-30 DIAGNOSIS — R059 Cough, unspecified: Secondary | ICD-10-CM

## 2013-06-30 DIAGNOSIS — J209 Acute bronchitis, unspecified: Secondary | ICD-10-CM

## 2013-06-30 DIAGNOSIS — H6690 Otitis media, unspecified, unspecified ear: Secondary | ICD-10-CM

## 2013-06-30 DIAGNOSIS — H6692 Otitis media, unspecified, left ear: Secondary | ICD-10-CM

## 2013-06-30 LAB — POCT INFLUENZA A/B
Influenza A, POC: NEGATIVE
Influenza B, POC: NEGATIVE

## 2013-06-30 MED ORDER — METHYLPREDNISOLONE (PAK) 4 MG PO TABS: ORAL_TABLET | ORAL | Status: DC

## 2013-06-30 MED ORDER — LEVOFLOXACIN 500 MG PO TABS: 500.0000 mg | ORAL_TABLET | Freq: Every day | ORAL | Status: DC

## 2013-06-30 MED ORDER — HYDROCODONE-HOMATROPINE 5-1.5 MG/5ML PO SYRP: 5.0000 mL | ORAL_SOLUTION | Freq: Three times a day (TID) | ORAL | Status: DC | PRN

## 2013-06-30 NOTE — Patient Instructions (Signed)

## 2013-06-30 NOTE — Progress Notes (Signed)
  Subjective:    Patient ID: Richard Moore, male    DOB: 1961-04-16, 52 y.o.   MRN: [id number]  HPI This 52 y.o. male presents for evaluation of fever, productive mucopurulent cough, congestion And fatigue.  He c/o of left ear pain.  He has been feeling bad for a week. He has been using An inhaler for bronchospasms he was rx'd in the past.   Review of Systems C/o fever and congestion No chest pain, SOB, HA, dizziness, vision change, N/V, diarrhea, constipation, dysuria, urinary urgency or frequency, myalgias, arthralgias or rash.     Objective:   Physical Exam Vital signs noted  Well developed well nourished male.  HEENT - Head atraumatic Normocephalic                Eyes - PERRLA, Conjuctiva - clear Sclera- Clear EOMI                Ears - Right TM injected                Nose - Nares patent                 Throat - oropharanx wnl Respiratory - Lungs CTA bilateral Cardiac - RRR S1 and S2 without murmur GI - Abdomen soft Nontender and bowel sounds active x 4 Extremities - No edema. Neuro - Grossly intact.  CXR - No infiltrates.     Assessment & Plan:  Cough - Plan: DG Chest 2 View, POCT Influenza A/B, Enterovirus DNA probe, amplified, HYDROcodone-homatropine (HYCODAN) 5-1.5 MG/5ML syrup  Acute bronchitis - Plan: methylPREDNIsolone (MEDROL DOSPACK) 4 MG tablet  Otitis media, left - Plan: methylPREDNIsolone (MEDROL DOSPACK) 4 MG tablet, levofloxacin (LEVAQUIN) 500 MG tablet  Deatra Canter FNP

## 2013-07-02 LAB — ENTEROVIRUS DNA PROBE, AMPLIFIED: Enterovirus RT-PCR: NEGATIVE

## 2013-07-25 ENCOUNTER — Ambulatory Visit (INDEPENDENT_AMBULATORY_CARE_PROVIDER_SITE_OTHER): Payer: BC Managed Care – PPO | Admitting: Family Medicine

## 2013-07-25 VITALS — BP 155/93 | HR 81 | Temp 98.8°F | Ht 75.0 in | Wt 232.0 lb

## 2013-07-25 DIAGNOSIS — J209 Acute bronchitis, unspecified: Secondary | ICD-10-CM

## 2013-07-25 MED ORDER — BENZONATATE 200 MG PO CAPS: 200.0000 mg | ORAL_CAPSULE | Freq: Two times a day (BID) | ORAL | Status: DC | PRN

## 2013-07-25 MED ORDER — LEVOFLOXACIN 500 MG PO TABS: 500.0000 mg | ORAL_TABLET | Freq: Every day | ORAL | Status: DC

## 2013-07-25 MED ORDER — METHYLPREDNISOLONE (PAK) 4 MG PO TABS: ORAL_TABLET | ORAL | Status: DC

## 2013-07-25 NOTE — Patient Instructions (Signed)

## 2013-07-25 NOTE — Progress Notes (Signed)
  Subjective:    Patient ID: Richard Moore, male    DOB: 1961/03/12, 52 y.o.   MRN: [id number]  HPI This 52 y.o. male presents for evaluation of cough and congestion. He has been having productive cough.   Review of Systems C/o cough and congestion No chest pain, SOB, HA, dizziness, vision change, N/V, diarrhea, constipation, dysuria, urinary urgency or frequency, myalgias, arthralgias or rash.     Objective:   Physical Exam  Vital signs noted  Well developed well nourished male.  HEENT - Head atraumatic Normocephalic                Eyes - PERRLA, Conjuctiva - clear Sclera- Clear EOMI                Ears - EAC's Wnl TM's Wnl Gross Hearing WNL                Nose - Nares patent                 Throat - oropharanx wnl Respiratory - Lungs CTA bilateral Cardiac - RRR S1 and S2 without murmur GI - Abdomen soft Nontender and bowel sounds active x 4 Extremities - No edema. Neuro - Grossly intact.      Assessment & Plan:  Acute bronchitis - Plan: levofloxacin (LEVAQUIN) 500 MG tablet, methylPREDNIsolone (MEDROL DOSPACK) 4 MG tablet, benzonatate (TESSALON) 200 MG capsule  Follow up prn  Deatra Canter FNP

## 2013-09-15 ENCOUNTER — Other Ambulatory Visit: Payer: Self-pay | Admitting: Nurse Practitioner

## 2013-09-19 NOTE — Telephone Encounter (Signed)
Last seen 07/12/12  MMM   Last lipid 06/29/12

## 2013-09-20 ENCOUNTER — Telehealth: Payer: Self-pay | Admitting: Nurse Practitioner

## 2014-05-22 ENCOUNTER — Ambulatory Visit (INDEPENDENT_AMBULATORY_CARE_PROVIDER_SITE_OTHER): Payer: BC Managed Care – PPO | Admitting: Family Medicine

## 2014-05-22 ENCOUNTER — Encounter: Payer: Self-pay | Admitting: Family Medicine

## 2014-05-22 VITALS — BP 132/84 | HR 58 | Temp 97.8°F | Ht 75.0 in | Wt 224.6 lb

## 2014-05-22 DIAGNOSIS — J208 Acute bronchitis due to other specified organisms: Secondary | ICD-10-CM

## 2014-05-22 DIAGNOSIS — R05 Cough: Secondary | ICD-10-CM

## 2014-05-22 DIAGNOSIS — R059 Cough, unspecified: Secondary | ICD-10-CM

## 2014-05-22 DIAGNOSIS — J209 Acute bronchitis, unspecified: Secondary | ICD-10-CM

## 2014-05-22 MED ORDER — HYDROCODONE-HOMATROPINE 5-1.5 MG/5ML PO SYRP: 5.0000 mL | ORAL_SOLUTION | Freq: Three times a day (TID) | ORAL | Status: DC | PRN

## 2014-05-22 MED ORDER — AMOXICILLIN 875 MG PO TABS: ORAL_TABLET | ORAL | Status: DC

## 2014-05-22 MED ORDER — METHYLPREDNISOLONE ACETATE 80 MG/ML IJ SUSP
80.0000 mg | Freq: Once | INTRAMUSCULAR | Status: AC
  Administered 2014-05-22: 80 mg via INTRAMUSCULAR

## 2014-05-22 NOTE — Progress Notes (Signed)
   Subjective:    Patient ID: Richard Moore, male    DOB: Apr 12, 1961, 53 y.o.   MRN: [id number]  HPI C/o sinus drainage, uri sx's, yellow drainage, and persistent cough that keeps him up at night.   Review of Systems No chest pain, SOB, HA, dizziness, vision change, N/V, diarrhea, constipation, dysuria, urinary urgency or frequency, myalgias, arthralgias or rash.     Objective:   Physical Exam  Vital signs noted  Well developed well nourished male.  HEENT - Head atraumatic Normocephalic                Eyes - PERRLA, Conjuctiva - clear Sclera- Clear EOMI                Ears - EAC's Wnl TM's Wnl Gross Hearing WNL                Throat - oropharanx wnl Respiratory - Lungs CTA bilateral Cardiac - RRR S1 and S2 without murmur GI - Abdomen soft Nontender and bowel sounds active x 4 Extremities - No edema. Neuro - Grossly intact.      Assessment & Plan:  Cough - Plan: HYDROcodone-homatropine (HYCODAN) 5-1.5 MG/5ML syrup, amoxicillin (AMOXIL) 875 MG tablet, methylPREDNISolone acetate (DEPO-MEDROL) injection 80 mg  Acute bronchitis due to other specified organisms - Plan: HYDROcodone-homatropine (HYCODAN) 5-1.5 MG/5ML syrup, amoxicillin (AMOXIL) 875 MG tablet, methylPREDNISolone acetate (DEPO-MEDROL) injection 80 mg  Lysbeth Penner FNP

## 2014-07-11 ENCOUNTER — Other Ambulatory Visit: Payer: Self-pay | Admitting: Nurse Practitioner

## 2014-07-12 NOTE — Telephone Encounter (Signed)
Last seen 05/22/14 B [place] no lipid in EPIC

## 2014-08-29 ENCOUNTER — Ambulatory Visit: Payer: Self-pay | Admitting: Family Medicine

## 2015-01-23 ENCOUNTER — Encounter: Payer: Self-pay | Admitting: Gastroenterology

## 2015-11-01 ENCOUNTER — Ambulatory Visit (INDEPENDENT_AMBULATORY_CARE_PROVIDER_SITE_OTHER): Payer: BLUE CROSS/BLUE SHIELD | Admitting: Nurse Practitioner

## 2015-11-01 ENCOUNTER — Encounter: Payer: Self-pay | Admitting: Nurse Practitioner

## 2015-11-01 ENCOUNTER — Other Ambulatory Visit: Payer: Self-pay

## 2015-11-01 VITALS — BP 162/100 | HR 56 | Temp 97.0°F | Ht 75.0 in | Wt 241.0 lb

## 2015-11-01 DIAGNOSIS — E1169 Type 2 diabetes mellitus with other specified complication: Secondary | ICD-10-CM | POA: Insufficient documentation

## 2015-11-01 DIAGNOSIS — Z6830 Body mass index (BMI) 30.0-30.9, adult: Secondary | ICD-10-CM | POA: Diagnosis not present

## 2015-11-01 DIAGNOSIS — E785 Hyperlipidemia, unspecified: Secondary | ICD-10-CM | POA: Diagnosis not present

## 2015-11-01 DIAGNOSIS — Z125 Encounter for screening for malignant neoplasm of prostate: Secondary | ICD-10-CM | POA: Diagnosis not present

## 2015-11-01 DIAGNOSIS — I10 Essential (primary) hypertension: Secondary | ICD-10-CM | POA: Insufficient documentation

## 2015-11-01 MED ORDER — LISINOPRIL-HYDROCHLOROTHIAZIDE 10-12.5 MG PO TABS: 1.0000 | ORAL_TABLET | Freq: Every day | ORAL | Status: DC

## 2015-11-01 MED ORDER — EZETIMIBE 10 MG PO TABS: 10.0000 mg | ORAL_TABLET | Freq: Every day | ORAL | Status: DC

## 2015-11-01 NOTE — Progress Notes (Signed)
   Subjective:    Patient ID: Richard Moore, male    DOB: 1961-08-25, 55 y.o.   MRN: [id number]   Patient in today for follow up- he has stopped taking all of his meds. Has no reason for stopping.  Hypertension This is a chronic problem. The current episode started more than 1 year ago. The problem is uncontrolled. Associated symptoms include chest pain. Risk factors for coronary artery disease include dyslipidemia and male gender. Treatments tried: stopped all meds  Compliance problems include diet and exercise.  Hypertensive end-organ damage includes CVA. There is no history of CAD/MI.  Hyperlipidemia This is a chronic problem. The current episode started more than 1 year ago. The problem is uncontrolled. He has no history of diabetes, hypothyroidism or obesity. Associated symptoms include chest pain. The current treatment provides moderate improvement of lipids. Compliance problems include adherence to diet and adherence to exercise.  Risk factors for coronary artery disease include dyslipidemia and male sex.      Review of Systems  Constitutional: Negative.   HENT: Negative.   Respiratory: Negative.   Cardiovascular: Positive for chest pain.  Gastrointestinal: Negative.   Genitourinary: Negative.   Neurological: Negative.   Psychiatric/Behavioral: Negative.   All other systems reviewed and are negative.      Objective:   Physical Exam  Constitutional: He is oriented to person, place, and time. He appears well-developed and well-nourished.  HENT:  Head: Normocephalic.  Right Ear: External ear normal.  Left Ear: External ear normal.  Nose: Nose normal.  Mouth/Throat: Oropharynx is clear and moist.  Eyes: EOM are normal. Pupils are equal, round, and reactive to light.  Neck: Normal range of motion. Neck supple. No JVD present. No thyromegaly present.  Cardiovascular: Normal rate, regular rhythm, normal heart sounds and intact distal pulses.  Exam reveals no gallop and no  friction rub.   No murmur heard. Pulmonary/Chest: Effort normal and breath sounds normal. No respiratory distress. He has no wheezes. He has no rales. He exhibits no tenderness.  Abdominal: Soft. Bowel sounds are normal. He exhibits no mass. There is no tenderness.  Musculoskeletal: Normal range of motion. He exhibits no edema.  Lymphadenopathy:    He has no cervical adenopathy.  Neurological: He is alert and oriented to person, place, and time. No cranial nerve deficit.  Skin: Skin is warm and dry.  Psychiatric: He has a normal mood and affect. His behavior is normal. Judgment and thought content normal.    BP 162/100 mmHg  Pulse 56  Temp(Src) 97 F (36.1 C) (Oral)  Ht _0  (1.905 m)  Wt 241 lb (109.317 kg)  BMI 30.12 kg/m2       Assessment & Plan:  1. Essential hypertension Do not add salt to diet - lisinopril-hydrochlorothiazide (PRINZIDE,ZESTORETIC) 10-12.5 MG tablet; Take 1 tablet by mouth daily.  Dispense: 90 tablet; Refill: 1 - CMP14+EGFR  2. Hyperlipidemia with target LDL less than 100 Low fat diet - ezetimibe (ZETIA) 10 MG tablet; Take 1 tablet (10 mg total) by mouth daily.  Dispense: 90 tablet; Refill: 1 - Lipid panel  3. BMI 30.0-30.9,adult Discussed diet and exercise for person with BMI >25 Will recheck weight in 3-6 months  4. Prostate cancer screening - PSA, total and free    Labs pending Health maintenance reviewed Diet and exercise encouraged Continue all meds Follow up  In 6 month   [place], FNP

## 2015-11-01 NOTE — Patient Instructions (Signed)
Hypertension Hypertension, commonly called high blood pressure, is when the force of blood pumping through your arteries is too strong. Your arteries are the blood vessels that carry blood from your heart throughout your body. A blood pressure reading consists of a higher number over a lower number, such as 110/72. The higher number (systolic) is the pressure inside your arteries when your heart pumps. The lower number (diastolic) is the pressure inside your arteries when your heart relaxes. Ideally you want your blood pressure below 120/80. Hypertension forces your heart to work harder to pump blood. Your arteries may become narrow or stiff. Having untreated or uncontrolled hypertension can cause heart attack, stroke, kidney disease, and other problems. RISK FACTORS Some risk factors for high blood pressure are controllable. Others are not.  Risk factors you cannot control include:   Race. You may be at higher risk if you are African American.  Age. Risk increases with age.  Gender. Men are at higher risk than women before age 45 years. After age 65, women are at higher risk than men. Risk factors you can control include:  Not getting enough exercise or physical activity.  Being overweight.  Getting too much fat, sugar, calories, or salt in your diet.  Drinking too much alcohol. SIGNS AND SYMPTOMS Hypertension does not usually cause signs or symptoms. Extremely high blood pressure (hypertensive crisis) may cause headache, anxiety, shortness of breath, and nosebleed. DIAGNOSIS To check if you have hypertension, your health care provider will measure your blood pressure while you are seated, with your arm held at the level of your heart. It should be measured at least twice using the same arm. Certain conditions can cause a difference in blood pressure between your right and left arms. A blood pressure reading that is higher than normal on one occasion does not mean that you need treatment. If  it is not clear whether you have high blood pressure, you may be asked to return on a different day to have your blood pressure checked again. Or, you may be asked to monitor your blood pressure at home for 1 or more weeks. TREATMENT Treating high blood pressure includes making lifestyle changes and possibly taking medicine. Living a healthy lifestyle can help lower high blood pressure. You may need to change some of your habits. Lifestyle changes may include:  Following the DASH diet. This diet is high in fruits, vegetables, and whole grains. It is low in salt, red meat, and added sugars.  Keep your sodium intake below 2,300 mg per day.  Getting at least 30-45 minutes of aerobic exercise at least 4 times per week.  Losing weight if necessary.  Not smoking.  Limiting alcoholic beverages.  Learning ways to reduce stress. Your health care provider may prescribe medicine if lifestyle changes are not enough to get your blood pressure under control, and if one of the following is true:  You are 18-59 years of age and your systolic blood pressure is above 140.  You are 60 years of age or older, and your systolic blood pressure is above 150.  Your diastolic blood pressure is above 90.  You have diabetes, and your systolic blood pressure is over 140 or your diastolic blood pressure is over 90.  You have kidney disease and your blood pressure is above 140/90.  You have heart disease and your blood pressure is above 140/90. Your personal target blood pressure may vary depending on your medical conditions, your age, and other factors. HOME CARE INSTRUCTIONS    Have your blood pressure rechecked as directed by your health care provider.   Take medicines only as directed by your health care provider. Follow the directions carefully. Blood pressure medicines must be taken as prescribed. The medicine does not work as well when you skip doses. Skipping doses also puts you at risk for  problems.  Do not smoke.   Monitor your blood pressure at home as directed by your health care provider. SEEK MEDICAL CARE IF:   You think you are having a reaction to medicines taken.  You have recurrent headaches or feel dizzy.  You have swelling in your ankles.  You have trouble with your vision. SEEK IMMEDIATE MEDICAL CARE IF:  You develop a severe headache or confusion.  You have unusual weakness, numbness, or feel faint.  You have severe chest or abdominal pain.  You vomit repeatedly.  You have trouble breathing. MAKE SURE YOU:   Understand these instructions.  Will watch your condition.  Will get help right away if you are not doing well or get worse.   This information is not intended to replace advice given to you by your health care provider. Make sure you discuss any questions you have with your health care provider.   Document Released: 08/31/2005 Document Revised: 01/15/2015 Document Reviewed: 06/23/2013 Elsevier Interactive Patient Education 2016 [employer]  

## 2015-11-02 LAB — CMP14+EGFR
ALBUMIN: 4.4 g/dL (ref 3.5–5.5)
ALK PHOS: 34 IU/L — AB (ref 39–117)
ALT: 50 IU/L — ABNORMAL HIGH (ref 0–44)
AST: 26 IU/L (ref 0–40)
Albumin/Globulin Ratio: 2.2 (ref 1.1–2.5)
BILIRUBIN TOTAL: 0.3 mg/dL (ref 0.0–1.2)
BUN / CREAT RATIO: 13 (ref 9–20)
BUN: 12 mg/dL (ref 6–24)
CO2: 22 mmol/L (ref 18–29)
CREATININE: 0.95 mg/dL (ref 0.76–1.27)
Calcium: 9.2 mg/dL (ref 8.7–10.2)
Chloride: 102 mmol/L (ref 96–106)
GFR calc non Af Amer: 91 mL/min/{1.73_m2} (ref >59)
GFR, EST AFRICAN AMERICAN: 105 mL/min/{1.73_m2} (ref >59)
GLOBULIN, TOTAL: 2 g/dL (ref 1.5–4.5)
Glucose: 88 mg/dL (ref 65–99)
Potassium: 4.5 mmol/L (ref 3.5–5.2)
SODIUM: 141 mmol/L (ref 134–144)
Total Protein: 6.4 g/dL (ref 6.0–8.5)

## 2015-11-02 LAB — LIPID PANEL
CHOLESTEROL TOTAL: 208 mg/dL — AB (ref 100–199)
Chol/HDL Ratio: 5.6 ratio units — ABNORMAL HIGH (ref 0.0–5.0)
HDL: 37 mg/dL — ABNORMAL LOW (ref >39)
LDL CALC: 125 mg/dL — AB (ref 0–99)
Triglycerides: 230 mg/dL — ABNORMAL HIGH (ref 0–149)
VLDL Cholesterol Cal: 46 mg/dL — ABNORMAL HIGH (ref 5–40)

## 2015-11-02 LAB — PSA, TOTAL AND FREE
PROSTATE SPECIFIC AG, SERUM: 0.4 ng/mL (ref 0.0–4.0)
PSA FREE PCT: 40 %
PSA FREE: 0.16 ng/mL

## 2015-11-04 ENCOUNTER — Other Ambulatory Visit: Payer: Self-pay | Admitting: Nurse Practitioner

## 2015-11-04 ENCOUNTER — Telehealth: Payer: Self-pay | Admitting: Nurse Practitioner

## 2015-11-04 MED ORDER — ATORVASTATIN CALCIUM 40 MG PO TABS: 40.0000 mg | ORAL_TABLET | Freq: Every day | ORAL | Status: DC

## 2015-11-04 NOTE — Telephone Encounter (Signed)
Reviewed results, pt states his left side is hurting and he had mentioned it to you at the appt. Pt says it isn't stabbing but annoying and has been there for a couple months. Pt has been taking aleve and motrin and using his wife's xanax at night to sleep because of the discomfort.

## 2015-11-05 NOTE — Telephone Encounter (Signed)
Patient will wait a couple more days and if the discomfort continues he will call to make appt

## 2015-11-05 NOTE — Telephone Encounter (Signed)
Not much on left side to hurt- is having regular bowel movements? Coupld be constipation- if continues NTBS

## 2016-06-25 ENCOUNTER — Encounter: Payer: Self-pay | Admitting: Gastroenterology

## 2016-07-16 ENCOUNTER — Encounter: Payer: Self-pay | Admitting: Gastroenterology

## 2016-09-18 ENCOUNTER — Ambulatory Visit (AMBULATORY_SURGERY_CENTER): Payer: Self-pay | Admitting: *Deleted

## 2016-09-18 VITALS — Ht 75.0 in | Wt 236.2 lb

## 2016-09-18 DIAGNOSIS — Z86010 Personal history of colonic polyps: Secondary | ICD-10-CM

## 2016-09-18 MED ORDER — NA SULFATE-K SULFATE-MG SULF 17.5-3.13-1.6 GM/177ML PO SOLN: ORAL | 0 refills | Status: DC

## 2016-09-18 NOTE — Progress Notes (Signed)
No egg or soy allergy  No anesthesia or intubation problems per pt  No diet medications taken  Registered in EMMI  No home oxygen used  $50 off coupon for suprep given

## 2016-10-02 ENCOUNTER — Ambulatory Visit (AMBULATORY_SURGERY_CENTER): Payer: BLUE CROSS/BLUE SHIELD | Admitting: Gastroenterology

## 2016-10-02 ENCOUNTER — Encounter: Payer: Self-pay | Admitting: Gastroenterology

## 2016-10-02 VITALS — BP 121/68 | HR 53 | Temp 98.9°F | Resp 10 | Ht 75.0 in | Wt 236.0 lb

## 2016-10-02 DIAGNOSIS — D123 Benign neoplasm of transverse colon: Secondary | ICD-10-CM | POA: Diagnosis not present

## 2016-10-02 DIAGNOSIS — Z86010 Personal history of colonic polyps: Secondary | ICD-10-CM

## 2016-10-02 DIAGNOSIS — K5730 Diverticulosis of large intestine without perforation or abscess without bleeding: Secondary | ICD-10-CM

## 2016-10-02 DIAGNOSIS — D124 Benign neoplasm of descending colon: Secondary | ICD-10-CM | POA: Diagnosis not present

## 2016-10-02 MED ORDER — SODIUM CHLORIDE 0.9 % IV SOLN: 500.0000 mL | INTRAVENOUS | Status: DC

## 2016-10-02 NOTE — Progress Notes (Signed)
Called to room to assist during endoscopic procedure.  Patient ID and intended procedure confirmed with present staff. Received instructions for my participation in the procedure from the performing physician.  

## 2016-10-02 NOTE — Op Note (Signed)
[facility] Patient Name: Juvon Huenefeld Procedure Date: 10/02/2016 11:55 AM MRN: [id number] Endoscopist: Milus Banister , MD Age: 56 Referring MD:  Date of Birth: Mar 21, 1961 Gender: Male Account #: [id number] Procedure:                Colonoscopy Indications:              High risk colon cancer surveillance: Personal                            history of colonic polyps; 4 subCM adenomas 2009, 1                            subCM adenoma 2012 Medicines:                Monitored Anesthesia Care Procedure:                Pre-Anesthesia Assessment:                           - Prior to the procedure, a History and Physical                            was performed, and patient medications and                            allergies were reviewed. The patient's tolerance of                            previous anesthesia was also reviewed. The risks                            and benefits of the procedure and the sedation                            options and risks were discussed with the patient.                            All questions were answered, and informed consent                            was obtained. Prior Anticoagulants: The patient has                            taken no previous anticoagulant or antiplatelet                            agents. ASA Grade Assessment: II - A patient with                            mild systemic disease. After reviewing the risks                            and benefits, the patient was deemed in  satisfactory condition to undergo the procedure.                           After obtaining informed consent, the colonoscope                            was passed under direct vision. Throughout the                            procedure, the patient's blood pressure, pulse, and                            oxygen saturations were monitored continuously. The                            Model CF-HQ190L [id number]) scope was  introduced                            through the anus and advanced to the the cecum,                            identified by appendiceal orifice and ileocecal                            valve. The colonoscopy was performed without                            difficulty. The patient tolerated the procedure                            well. The quality of the bowel preparation was                            excellent. The ileocecal valve, appendiceal                            orifice, and rectum were photographed. Scope In: 12:04:32 PM Scope Out: [id number] PM Scope Withdrawal Time: 0 hours 10 minutes 9 seconds  Total Procedure Duration: 0 hours 13 minutes 44 seconds  Findings:                 Four sessile polyps were found in the descending                            colon and transverse colon. The polyps were 3 to 4                            mm in size. These polyps were removed with a cold                            snare. Resection and retrieval were complete.                           Many small-mouthed diverticula were found in the  left colon.                           The exam was otherwise without abnormality on                            direct and retroflexion views. Complications:            No immediate complications. Estimated blood loss:                            None. Estimated Blood Loss:     Estimated blood loss: none. Impression:               - Four 3 to 4 mm polyps in the descending colon and                            in the transverse colon, removed with a cold snare.                            Resected and retrieved.                           - Diverticulosis in the left colon.                           - The examination was otherwise normal on direct                            and retroflexion views. Recommendation:           - Patient has a contact number available for                            emergencies. The signs and symptoms of  potential                            delayed complications were discussed with the                            patient. Return to normal activities tomorrow.                            Written discharge instructions were provided to the                            patient.                           - Resume previous diet.                           - Continue present medications.                           You will receive a letter within 2-3 weeks with the  pathology results and my final recommendations.                           If the polyp(s) is proven to be 'pre-cancerous' on                            pathology, you will need repeat colonoscopy in 3-5                            years. If the polyp(s) is NOT 'precancerous' on                            pathology then you should repeat colon cancer                            screening in 10 years with colonoscopy without need                            for colon cancer screening by any method prior to                            then (including stool testing). Milus Banister, MD 10/02/2016 12:21:06 PM This report has been signed electronically.

## 2016-10-02 NOTE — Patient Instructions (Signed)
Discharge instructions given. Handouts on polyps and diverticulosis. Resume previous medications. YOU HAD AN ENDOSCOPIC PROCEDURE TODAY AT THE [facility]:   Refer to the procedure report that was given to you for any specific questions about what was found during the examination.  If the procedure report does not answer your questions, please call your gastroenterologist to clarify.  If you requested that your care partner not be given the details of your procedure findings, then the procedure report has been included in a sealed envelope for you to review at your convenience later.  YOU SHOULD EXPECT: Some feelings of bloating in the abdomen. Passage of more gas than usual.  Walking can help get rid of the air that was put into your GI tract during the procedure and reduce the bloating. If you had a lower endoscopy (such as a colonoscopy or flexible sigmoidoscopy) you may notice spotting of blood in your stool or on the toilet paper. If you underwent a bowel prep for your procedure, you may not have a normal bowel movement for a few days.  Please Note:  You might notice some irritation and congestion in your nose or some drainage.  This is from the oxygen used during your procedure.  There is no need for concern and it should clear up in a day or so.  SYMPTOMS TO REPORT IMMEDIATELY:   Following lower endoscopy (colonoscopy or flexible sigmoidoscopy):  Excessive amounts of blood in the stool  Significant tenderness or worsening of abdominal pains  Swelling of the abdomen that is new, acute  Fever of 100F or higher   For urgent or emergent issues, a gastroenterologist can be reached at any hour by calling [phone].   DIET:  We do recommend a small meal at first, but then you may proceed to your regular diet.  Drink plenty of fluids but you should avoid alcoholic beverages for 24 hours.  ACTIVITY:  You should plan to take it easy for the rest of today and you should NOT  DRIVE or use heavy machinery until tomorrow (because of the sedation medicines used during the test).    FOLLOW UP: Our staff will call the number listed on your records the next business day following your procedure to check on you and address any questions or concerns that you may have regarding the information given to you following your procedure. If we do not reach you, we will leave a message.  However, if you are feeling well and you are not experiencing any problems, there is no need to return our call.  We will assume that you have returned to your regular daily activities without incident.  If any biopsies were taken you will be contacted by phone or by letter within the next 1-3 weeks.  Please call us at [phone] if you have not heard about the biopsies in 3 weeks.    SIGNATURES/CONFIDENTIALITY: You and/or your care partner have signed paperwork which will be entered into your electronic medical record.  These signatures attest to the fact that that the information above on your After Visit Summary has been reviewed and is understood.  Full responsibility of the confidentiality of this discharge information lies with you and/or your care-partner. 

## 2016-10-02 NOTE — Progress Notes (Signed)
A and O x3. Report to RN. Tolerated MAC anesthesia well.

## 2016-10-05 ENCOUNTER — Telehealth: Payer: Self-pay

## 2016-10-05 NOTE — Telephone Encounter (Signed)
  Follow up Call-  Call back number 10/02/2016  Post procedure Call Back phone  # [phone]  Permission to leave phone message Yes  Some recent data might be hidden    Patient was called for follow up after his procedure on 10/02/2016. No answer at the number given for follow up phone call. A message was left on the answering machine.

## 2016-10-05 NOTE — Telephone Encounter (Signed)
  Follow up Call-  Call back number 10/02/2016  Post procedure Call Back phone  # [phone]  Permission to leave phone message Yes  Some recent data might be hidden    Patient was called for follow up after his procedure on 10/02/2016. No answer at the number given for follow up phone call. A message was left on the answering machine.

## 2016-10-11 ENCOUNTER — Encounter: Payer: Self-pay | Admitting: Gastroenterology

## 2016-10-23 ENCOUNTER — Other Ambulatory Visit: Payer: Self-pay | Admitting: Nurse Practitioner

## 2016-10-23 DIAGNOSIS — E785 Hyperlipidemia, unspecified: Secondary | ICD-10-CM

## 2016-10-23 DIAGNOSIS — I10 Essential (primary) hypertension: Secondary | ICD-10-CM

## 2016-11-06 ENCOUNTER — Telehealth: Payer: Self-pay | Admitting: Nurse Practitioner

## 2016-11-06 NOTE — Telephone Encounter (Signed)
Patient last seen 11/01/15, would like refill of Zetia and Lisinopril, please advise.

## 2016-11-07 ENCOUNTER — Other Ambulatory Visit: Payer: Self-pay | Admitting: *Deleted

## 2016-11-07 ENCOUNTER — Other Ambulatory Visit: Payer: Self-pay | Admitting: Nurse Practitioner

## 2016-11-07 DIAGNOSIS — E785 Hyperlipidemia, unspecified: Secondary | ICD-10-CM

## 2016-11-07 DIAGNOSIS — I10 Essential (primary) hypertension: Secondary | ICD-10-CM

## 2016-11-07 MED ORDER — EZETIMIBE 10 MG PO TABS: 10.0000 mg | ORAL_TABLET | Freq: Every day | ORAL | 0 refills | Status: DC

## 2016-11-07 MED ORDER — LISINOPRIL-HYDROCHLOROTHIAZIDE 10-12.5 MG PO TABS: 1.0000 | ORAL_TABLET | Freq: Every day | ORAL | 0 refills | Status: DC

## 2016-11-07 NOTE — Progress Notes (Signed)
zetia and lisinopril sent in- no more refills without being seen

## 2017-05-03 ENCOUNTER — Telehealth: Payer: Self-pay | Admitting: Nurse Practitioner

## 2017-05-03 DIAGNOSIS — I10 Essential (primary) hypertension: Secondary | ICD-10-CM

## 2017-05-03 DIAGNOSIS — E785 Hyperlipidemia, unspecified: Secondary | ICD-10-CM

## 2017-05-03 MED ORDER — EZETIMIBE 10 MG PO TABS: 10.0000 mg | ORAL_TABLET | Freq: Every day | ORAL | 5 refills | Status: DC

## 2017-05-03 MED ORDER — LISINOPRIL-HYDROCHLOROTHIAZIDE 10-12.5 MG PO TABS: 1.0000 | ORAL_TABLET | Freq: Every day | ORAL | 1 refills | Status: DC

## 2017-05-03 MED ORDER — ATORVASTATIN CALCIUM 40 MG PO TABS: 40.0000 mg | ORAL_TABLET | Freq: Every day | ORAL | 5 refills | Status: DC

## 2017-05-03 NOTE — Telephone Encounter (Signed)
Lm for pt -- aware of med = they will call back about Dr Toy Cookey.

## 2017-05-03 NOTE — Telephone Encounter (Signed)
Who is DR. Toy Cookey that need referral to- pulmonologist, neurologist or what RX sent to pharmacy

## 2017-05-06 NOTE — Telephone Encounter (Signed)
Pt has appt with MMM 05/18/17, will close encounter.

## 2017-05-18 ENCOUNTER — Ambulatory Visit (INDEPENDENT_AMBULATORY_CARE_PROVIDER_SITE_OTHER): Payer: BLUE CROSS/BLUE SHIELD | Admitting: Nurse Practitioner

## 2017-05-18 ENCOUNTER — Encounter: Payer: Self-pay | Admitting: Nurse Practitioner

## 2017-05-18 VITALS — BP 127/81 | HR 86 | Temp 97.9°F | Ht 75.0 in | Wt 232.0 lb

## 2017-05-18 DIAGNOSIS — E785 Hyperlipidemia, unspecified: Secondary | ICD-10-CM

## 2017-05-18 DIAGNOSIS — I10 Essential (primary) hypertension: Secondary | ICD-10-CM

## 2017-05-18 DIAGNOSIS — R350 Frequency of micturition: Secondary | ICD-10-CM | POA: Diagnosis not present

## 2017-05-18 DIAGNOSIS — R413 Other amnesia: Secondary | ICD-10-CM

## 2017-05-18 DIAGNOSIS — Z6830 Body mass index (BMI) 30.0-30.9, adult: Secondary | ICD-10-CM

## 2017-05-18 MED ORDER — DONEPEZIL HCL 10 MG PO TABS: 10.0000 mg | ORAL_TABLET | Freq: Every day | ORAL | 2 refills | Status: DC

## 2017-05-18 NOTE — Progress Notes (Signed)
Subjective:    Patient ID: Richard Moore, male    DOB: 07-26-1961, 56 y.o.   MRN: [id number]  HPI  Richard Moore is here today for follow up of chronic medical problem.  Outpatient Encounter Prescriptions as of 05/18/2017  Medication Sig  . ezetimibe (ZETIA) 10 MG tablet Take 1 tablet (10 mg total) by mouth daily.  Marland Kitchen ibuprofen (ADVIL,MOTRIN) 200 MG tablet Take 800 mg by mouth every 2 (two) hours as needed. Reported on 11/01/2015  . lisinopril-hydrochlorothiazide (PRINZIDE,ZESTORETIC) 10-12.5 MG tablet Take 1 tablet by mouth daily.  Marland Kitchen atorvastatin (LIPITOR) 40 MG tablet Take 1 tablet (40 mg total) by mouth daily. (Patient not taking: Reported on 05/18/2017)     1. Essential hypertension  No c/o chest pain, sob or headache. Dos ot check blood pressures at home  2. Hyperlipidemia with target LDL less than 100  Tries to decrease fat in diet  3. BMI 30.0-30.9,adult  No recent weight changes   Depression screen [facility] 2/9 05/18/2017 05/22/2014  Decreased Interest 0 0  Down, Depressed, Hopeless 0 0  PHQ - 2 Score 0 0     New complaints: - having short term memory problems- cannot remember things from previous day. - left flank pain- constant pain- ignores pain sometimes but seems to be hurting all the time 6/10 at its greatest. - frequent urination daily and nightly- says he drinks a lot of water. Doesn't think stream is weaker then usual.  Social history: Lives with wife and children- works out if town alot    Review of Systems  Constitutional: Negative for activity change and appetite change.  HENT: Negative.   Eyes: Negative for pain.  Respiratory: Negative for shortness of breath.   Cardiovascular: Negative for chest pain, palpitations and leg swelling.  Gastrointestinal: Negative for abdominal pain.  Endocrine: Negative for polydipsia.  Genitourinary: Positive for frequency.  Skin: Negative for rash.  Neurological: Negative for dizziness, weakness and headaches.    Hematological: Does not bruise/bleed easily.  Psychiatric/Behavioral: Negative.  Negative for confusion.  All other systems reviewed and are negative.      Objective:   Physical Exam  Constitutional: He is oriented to person, place, and time. He appears well-developed and well-nourished.  HENT:  Head: Normocephalic.  Right Ear: External ear normal.  Left Ear: External ear normal.  Nose: Nose normal.  Mouth/Throat: Oropharynx is clear and moist.  Eyes: Pupils are equal, round, and reactive to light. EOM are normal.  Neck: Normal range of motion. Neck supple. No JVD present. No thyromegaly present.  Cardiovascular: Normal rate, regular rhythm, normal heart sounds and intact distal pulses.  Exam reveals no gallop and no friction rub.   No murmur heard. Pulmonary/Chest: Effort normal and breath sounds normal. No respiratory distress. He has no wheezes. He has no rales. He exhibits no tenderness.  Abdominal: Soft. Bowel sounds are normal. He exhibits no mass. There is no tenderness.  Genitourinary: Prostate normal and penis normal.  Musculoskeletal: Normal range of motion. He exhibits no edema.  Lymphadenopathy:    He has no cervical adenopathy.  Neurological: He is alert and oriented to person, place, and time. No cranial nerve deficit.  Skin: Skin is warm and dry.  Psychiatric: He has a normal mood and affect. His behavior is normal. Judgment and thought content normal.   BP 127/81   Pulse 86   Temp 97.9 F (36.6 C) (Oral)   Ht _0  (1.905 m)   Wt 232 lb (105.2  kg)   BMI 29.00 kg/m        Assessment & Plan:  1. Essential hypertension Low sodium diet - CMP14+EGFR  2. Hyperlipidemia with target LDL less than 100 Low fat diet - Lipid panel  3. BMI 30.0-30.9,adult Discussed diet and exercise for person with BMI >25 Will recheck weight in 3-6 months  4. Memory disturbance will see if aricept helps - donepezil (ARICEPT) 10 MG tablet; Take 1 tablet (10 mg total) by  mouth at bedtime.  Dispense: 30 tablet; Refill: 2  5. Frequent urination Will wait on lab results before sending to see urology - PSA, total and free    Labs pending Health maintenance reviewed Diet and exercise encouraged Continue all meds Follow up  In 3 months   [place], FNP

## 2017-05-18 NOTE — Patient Instructions (Signed)

## 2017-05-19 ENCOUNTER — Other Ambulatory Visit: Payer: BLUE CROSS/BLUE SHIELD

## 2017-05-21 ENCOUNTER — Other Ambulatory Visit: Payer: BLUE CROSS/BLUE SHIELD

## 2017-05-21 ENCOUNTER — Other Ambulatory Visit: Payer: Self-pay

## 2017-05-21 DIAGNOSIS — E875 Hyperkalemia: Secondary | ICD-10-CM

## 2017-05-21 LAB — LIPID PANEL
CHOLESTEROL TOTAL: 184 mg/dL (ref 100–199)
Chol/HDL Ratio: 4.5 ratio (ref 0.0–5.0)
HDL: 41 mg/dL (ref >39)
LDL Calculated: 93 mg/dL (ref 0–99)
TRIGLYCERIDES: 248 mg/dL — AB (ref 0–149)
VLDL Cholesterol Cal: 50 mg/dL — ABNORMAL HIGH (ref 5–40)

## 2017-05-21 LAB — CMP14+EGFR
A/G RATIO: 1.8 (ref 1.2–2.2)
ALT: 46 IU/L — AB (ref 0–44)
AST: 29 IU/L (ref 0–40)
Albumin: 4.6 g/dL (ref 3.5–5.5)
Alkaline Phosphatase: 37 IU/L — ABNORMAL LOW (ref 39–117)
BILIRUBIN TOTAL: 0.4 mg/dL (ref 0.0–1.2)
BUN/Creatinine Ratio: 12 (ref 9–20)
BUN: 13 mg/dL (ref 6–24)
CALCIUM: 9.3 mg/dL (ref 8.7–10.2)
CHLORIDE: 100 mmol/L (ref 96–106)
CO2: 20 mmol/L (ref 20–29)
Creatinine, Ser: 1.05 mg/dL (ref 0.76–1.27)
GFR calc Af Amer: 93 mL/min/{1.73_m2} (ref >59)
GFR, EST NON AFRICAN AMERICAN: 80 mL/min/{1.73_m2} (ref >59)
Globulin, Total: 2.5 g/dL (ref 1.5–4.5)
Glucose: 77 mg/dL (ref 65–99)
POTASSIUM: 8.6 mmol/L — AB (ref 3.5–5.2)
Sodium: 136 mmol/L (ref 134–144)
Total Protein: 7.1 g/dL (ref 6.0–8.5)

## 2017-05-21 LAB — PSA, TOTAL AND FREE
PSA, Free Pct: 25 %
PSA, Free: 0.15 ng/mL
Prostate Specific Ag, Serum: 0.6 ng/mL (ref 0.0–4.0)

## 2017-05-22 LAB — CMP14+EGFR
ALT: 44 IU/L (ref 0–44)
AST: 26 IU/L (ref 0–40)
Albumin/Globulin Ratio: 2.1 (ref 1.2–2.2)
Albumin: 4.5 g/dL (ref 3.5–5.5)
Alkaline Phosphatase: 34 IU/L — ABNORMAL LOW (ref 39–117)
BUN/Creatinine Ratio: 11 (ref 9–20)
BUN: 10 mg/dL (ref 6–24)
Bilirubin Total: 0.5 mg/dL (ref 0.0–1.2)
CALCIUM: 9.8 mg/dL (ref 8.7–10.2)
CO2: 24 mmol/L (ref 20–29)
CREATININE: 0.87 mg/dL (ref 0.76–1.27)
Chloride: 100 mmol/L (ref 96–106)
GFR calc Af Amer: 113 mL/min/{1.73_m2} (ref >59)
GFR, EST NON AFRICAN AMERICAN: 98 mL/min/{1.73_m2} (ref >59)
GLOBULIN, TOTAL: 2.1 g/dL (ref 1.5–4.5)
Glucose: 81 mg/dL (ref 65–99)
Potassium: 4.5 mmol/L (ref 3.5–5.2)
SODIUM: 140 mmol/L (ref 134–144)
TOTAL PROTEIN: 6.6 g/dL (ref 6.0–8.5)

## 2017-06-02 ENCOUNTER — Telehealth: Payer: Self-pay | Admitting: Nurse Practitioner

## 2017-06-02 NOTE — Telephone Encounter (Signed)
Juliann Pulse faxed yesterday- patient aware.

## 2017-07-10 DIAGNOSIS — J209 Acute bronchitis, unspecified: Secondary | ICD-10-CM | POA: Diagnosis not present

## 2017-07-10 DIAGNOSIS — H6691 Otitis media, unspecified, right ear: Secondary | ICD-10-CM | POA: Diagnosis not present

## 2017-08-11 ENCOUNTER — Telehealth: Payer: Self-pay | Admitting: Nurse Practitioner

## 2017-08-11 ENCOUNTER — Other Ambulatory Visit: Payer: Self-pay | Admitting: *Deleted

## 2017-08-11 DIAGNOSIS — E785 Hyperlipidemia, unspecified: Secondary | ICD-10-CM

## 2017-08-11 DIAGNOSIS — I10 Essential (primary) hypertension: Secondary | ICD-10-CM

## 2017-08-11 MED ORDER — EZETIMIBE 10 MG PO TABS: 10.0000 mg | ORAL_TABLET | Freq: Every day | ORAL | 0 refills | Status: DC

## 2017-08-11 MED ORDER — LISINOPRIL-HYDROCHLOROTHIAZIDE 10-12.5 MG PO TABS: 1.0000 | ORAL_TABLET | Freq: Every day | ORAL | 0 refills | Status: DC

## 2017-08-11 MED ORDER — LISINOPRIL-HYDROCHLOROTHIAZIDE 10-12.5 MG PO TABS: 1.0000 | ORAL_TABLET | Freq: Every day | ORAL | 1 refills | Status: DC

## 2017-08-11 MED ORDER — EZETIMIBE 10 MG PO TABS: 10.0000 mg | ORAL_TABLET | Freq: Every day | ORAL | 1 refills | Status: DC

## 2017-08-11 NOTE — Progress Notes (Signed)
RXs sent to [facility] per pt request Okayed per MMM Pt notified

## 2017-11-05 ENCOUNTER — Encounter: Payer: Self-pay | Admitting: Physician Assistant

## 2017-11-05 ENCOUNTER — Ambulatory Visit (INDEPENDENT_AMBULATORY_CARE_PROVIDER_SITE_OTHER): Payer: BLUE CROSS/BLUE SHIELD | Admitting: Physician Assistant

## 2017-11-05 ENCOUNTER — Ambulatory Visit (INDEPENDENT_AMBULATORY_CARE_PROVIDER_SITE_OTHER): Payer: BLUE CROSS/BLUE SHIELD

## 2017-11-05 VITALS — BP 129/80 | HR 67 | Temp 98.1°F | Resp 18 | Ht 75.0 in | Wt 220.2 lb

## 2017-11-05 DIAGNOSIS — M545 Low back pain, unspecified: Secondary | ICD-10-CM

## 2017-11-05 DIAGNOSIS — R079 Chest pain, unspecified: Secondary | ICD-10-CM

## 2017-11-05 DIAGNOSIS — M546 Pain in thoracic spine: Secondary | ICD-10-CM

## 2017-11-05 DIAGNOSIS — R05 Cough: Secondary | ICD-10-CM | POA: Diagnosis not present

## 2017-11-05 MED ORDER — AZITHROMYCIN 250 MG PO TABS: ORAL_TABLET | ORAL | 0 refills | Status: DC

## 2017-11-05 MED ORDER — PREDNISONE 10 MG (48) PO TBPK: ORAL_TABLET | ORAL | 0 refills | Status: DC

## 2017-11-05 NOTE — Patient Instructions (Signed)
In a few days you may receive a survey in the mail or online from [employer] regarding your visit with us today. Please take a moment to fill this out. Your feedback is very important to our whole office. It can help us better understand your needs as well as improve your experience and satisfaction. Thank you for taking your time to complete it. We care about you.  Manpreet Strey, PA-C  

## 2017-11-06 ENCOUNTER — Telehealth: Payer: Self-pay | Admitting: *Deleted

## 2017-11-06 ENCOUNTER — Other Ambulatory Visit: Payer: Self-pay | Admitting: *Deleted

## 2017-11-06 DIAGNOSIS — Z7689 Persons encountering health services in other specified circumstances: Secondary | ICD-10-CM

## 2017-11-06 LAB — CMP14+EGFR
ALBUMIN: 4.5 g/dL (ref 3.5–5.5)
ALT: 43 IU/L (ref 0–44)
AST: 26 IU/L (ref 0–40)
Albumin/Globulin Ratio: 1.9 (ref 1.2–2.2)
Alkaline Phosphatase: 36 IU/L — ABNORMAL LOW (ref 39–117)
BUN/Creatinine Ratio: 13 (ref 9–20)
BUN: 12 mg/dL (ref 6–24)
Bilirubin Total: 0.8 mg/dL (ref 0.0–1.2)
CALCIUM: 9 mg/dL (ref 8.7–10.2)
CO2: 20 mmol/L (ref 20–29)
Chloride: 103 mmol/L (ref 96–106)
Creatinine, Ser: 0.94 mg/dL (ref 0.76–1.27)
GFR, EST AFRICAN AMERICAN: 105 mL/min/{1.73_m2} (ref >59)
GFR, EST NON AFRICAN AMERICAN: 91 mL/min/{1.73_m2} (ref >59)
GLUCOSE: 67 mg/dL (ref 65–99)
Globulin, Total: 2.4 g/dL (ref 1.5–4.5)
Potassium: 4 mmol/L (ref 3.5–5.2)
Sodium: 144 mmol/L (ref 134–144)
Total Protein: 6.9 g/dL (ref 6.0–8.5)

## 2017-11-06 LAB — CBC WITH DIFFERENTIAL/PLATELET
BASOS: 0 %
Basophils Absolute: 0 10*3/uL (ref 0.0–0.2)
EOS (ABSOLUTE): 0 10*3/uL (ref 0.0–0.4)
Eos: 1 %
HEMOGLOBIN: 16.1 g/dL (ref 13.0–17.7)
Hematocrit: 47 % (ref 37.5–51.0)
IMMATURE GRANS (ABS): 0 10*3/uL (ref 0.0–0.1)
IMMATURE GRANULOCYTES: 0 %
LYMPHS: 37 %
Lymphocytes Absolute: 2.5 10*3/uL (ref 0.7–3.1)
MCH: 30.7 pg (ref 26.6–33.0)
MCHC: 34.3 g/dL (ref 31.5–35.7)
MCV: 90 fL (ref 79–97)
MONOCYTES: 10 %
Monocytes Absolute: 0.7 10*3/uL (ref 0.1–0.9)
NEUTROS PCT: 52 %
Neutrophils Absolute: 3.4 10*3/uL (ref 1.4–7.0)
Platelets: 159 10*3/uL (ref 150–379)
RBC: 5.25 x10E6/uL (ref 4.14–5.80)
RDW: 13.7 % (ref 12.3–15.4)
WBC: 6.6 10*3/uL (ref 3.4–10.8)

## 2017-11-06 LAB — URINE CULTURE

## 2017-11-06 LAB — MICROSCOPIC EXAMINATION
Bacteria, UA: NONE SEEN
CASTS: NONE SEEN /LPF
Epithelial Cells (non renal): NONE SEEN /hpf (ref 0–10)

## 2017-11-06 LAB — URINALYSIS, COMPLETE
Bilirubin, UA: NEGATIVE
Glucose, UA: NEGATIVE
Ketones, UA: NEGATIVE
LEUKOCYTES UA: NEGATIVE
Nitrite, UA: NEGATIVE
PH UA: 5.5 (ref 5.0–7.5)
PROTEIN UA: NEGATIVE
RBC, UA: NEGATIVE
Specific Gravity, UA: 1.015 (ref 1.005–1.030)
Urobilinogen, Ur: 0.2 mg/dL (ref 0.2–1.0)

## 2017-11-06 NOTE — Telephone Encounter (Signed)
Error

## 2017-11-08 NOTE — Progress Notes (Signed)
BP 129/80   Pulse 67   Temp 98.1 F (36.7 C) (Oral)   Resp 18   Ht 6' 3"  (1.905 m)   Wt 220 lb 3.2 oz (99.9 kg)   SpO2 100%   BMI 27.52 kg/m    Subjective:    Patient ID: Richard Moore, male    DOB: 1961-04-16, 57 y.o.   MRN: [id number]  HPI: Richard Moore is a 57 y.o. male presenting on 11/05/2017 for Back Pain; Chest Pain; and Numbness in fingertips  Patient comes in for several days of low back pain.  However today it went up into his upper back and came around his chest.  He also complained of numbness in his fingertips.  He has had years of left-sided sciatica type pain.  It also will move over to the right.  It has maintained staying in the low back throughout the day.  No medication he has taken has helped.  Past Medical History:  Diagnosis Date  . GERD (gastroesophageal reflux disease)   . Hyperlipidemia   . Hypertension   . Pneumonia   . Sleep apnea    doesn't wear CPAP   Relevant past medical, surgical, family and social history reviewed and updated as indicated. Interim medical history since our last visit reviewed. Allergies and medications reviewed and updated. DATA REVIEWED: CHART IN EPIC  Family History reviewed for pertinent findings.  Review of Systems  Constitutional: Positive for fatigue. Negative for appetite change and fever.  HENT: Negative.   Eyes: Negative.  Negative for pain and visual disturbance.  Respiratory: Positive for chest tightness. Negative for cough, shortness of breath and wheezing.   Cardiovascular: Positive for chest pain. Negative for palpitations and leg swelling.  Gastrointestinal: Negative.  Negative for abdominal pain, diarrhea, nausea and vomiting.  Endocrine: Negative.   Genitourinary: Negative.   Musculoskeletal: Positive for arthralgias, back pain and myalgias.  Skin: Negative.  Negative for color change and rash.  Neurological: Negative.  Negative for weakness, numbness and headaches.    Psychiatric/Behavioral: Negative.     Allergies as of 11/05/2017   No Known Allergies     Medication List        Accurate as of 11/05/17 11:59 PM. Always use your most recent med list.          atorvastatin 40 MG tablet Commonly known as:  LIPITOR Take 1 tablet (40 mg total) by mouth daily.   azithromycin 250 MG tablet Commonly known as:  ZITHROMAX Z-PAK Take as directed   donepezil 10 MG tablet Commonly known as:  ARICEPT Take 1 tablet (10 mg total) by mouth at bedtime.   ezetimibe 10 MG tablet Commonly known as:  ZETIA Take 1 tablet (10 mg total) by mouth daily.   ibuprofen 200 MG tablet Commonly known as:  ADVIL,MOTRIN Take 800 mg by mouth every 2 (two) hours as needed. Reported on 11/01/2015   lisinopril-hydrochlorothiazide 10-12.5 MG tablet Commonly known as:  PRINZIDE,ZESTORETIC Take 1 tablet by mouth daily.   predniSONE 10 MG (48) Tbpk tablet Commonly known as:  STERAPRED UNI-PAK 48 TAB Take 12 day pack as directed          Objective:    BP 129/80   Pulse 67   Temp 98.1 F (36.7 C) (Oral)   Resp 18   Ht 6' 3"  (1.905 m)   Wt 220 lb 3.2 oz (99.9 kg)   SpO2 100%   BMI 27.52 kg/m   No Known Allergies  Wt  Readings from Last 3 Encounters:  11/05/17 220 lb 3.2 oz (99.9 kg)  05/18/17 232 lb (105.2 kg)  10/02/16 236 lb (107 kg)    Physical Exam  Constitutional: He appears well-developed and well-nourished.  HENT:  Head: Normocephalic and atraumatic.  Eyes: Conjunctivae and EOM are normal. Pupils are equal, round, and reactive to light.  Neck: Normal range of motion. Neck supple.  Cardiovascular: Normal rate, regular rhythm and normal heart sounds.  Pulmonary/Chest: Effort normal and breath sounds normal.  Abdominal: Soft. Bowel sounds are normal.  Musculoskeletal: Normal range of motion.  Skin: Skin is warm and dry.  Nursing note and vitals reviewed.   Results for orders placed or performed in visit on 11/05/17  Urine Culture  Result  Value Ref Range   Urine Culture, Routine Final report    Organism ID, Bacteria Comment   Microscopic Examination  Result Value Ref Range   WBC, UA 0-5 0 - 5 /hpf   RBC, UA 0-2 0 - 2 /hpf   Epithelial Cells (non renal) None seen 0 - 10 /hpf   Casts None seen None seen /lpf   Mucus, UA Present Not Estab.   Bacteria, UA None seen None seen/Few  CBC with Differential/Platelet  Result Value Ref Range   WBC 6.6 3.4 - 10.8 x10E3/uL   RBC 5.25 4.14 - 5.80 x10E6/uL   Hemoglobin 16.1 13.0 - 17.7 g/dL   Hematocrit 47.0 37.5 - 51.0 %   MCV 90 79 - 97 fL   MCH 30.7 26.6 - 33.0 pg   MCHC 34.3 31.5 - 35.7 g/dL   RDW 13.7 12.3 - 15.4 %   Platelets 159 150 - 379 x10E3/uL   Neutrophils 52 Not Estab. %   Lymphs 37 Not Estab. %   Monocytes 10 Not Estab. %   Eos 1 Not Estab. %   Basos 0 Not Estab. %   Neutrophils Absolute 3.4 1.4 - 7.0 x10E3/uL   Lymphocytes Absolute 2.5 0.7 - 3.1 x10E3/uL   Monocytes Absolute 0.7 0.1 - 0.9 x10E3/uL   EOS (ABSOLUTE) 0.0 0.0 - 0.4 x10E3/uL   Basophils Absolute 0.0 0.0 - 0.2 x10E3/uL   Immature Granulocytes 0 Not Estab. %   Immature Grans (Abs) 0.0 0.0 - 0.1 x10E3/uL  CMP14+EGFR  Result Value Ref Range   Glucose 67 65 - 99 mg/dL   BUN 12 6 - 24 mg/dL   Creatinine, Ser 0.94 0.76 - 1.27 mg/dL   GFR calc non Af Amer 91 >59 mL/min/1.73   GFR calc Af Amer 105 >59 mL/min/1.73   BUN/Creatinine Ratio 13 9 - 20   Sodium 144 134 - 144 mmol/L   Potassium 4.0 3.5 - 5.2 mmol/L   Chloride 103 96 - 106 mmol/L   CO2 20 20 - 29 mmol/L   Calcium 9.0 8.7 - 10.2 mg/dL   Total Protein 6.9 6.0 - 8.5 g/dL   Albumin 4.5 3.5 - 5.5 g/dL   Globulin, Total 2.4 1.5 - 4.5 g/dL   Albumin/Globulin Ratio 1.9 1.2 - 2.2   Bilirubin Total 0.8 0.0 - 1.2 mg/dL   Alkaline Phosphatase 36 (L) 39 - 117 IU/L   AST 26 0 - 40 IU/L   ALT 43 0 - 44 IU/L  Urinalysis, Complete  Result Value Ref Range   Specific Gravity, UA 1.015 1.005 - 1.030   pH, UA 5.5 5.0 - 7.5   Color, UA Yellow Yellow    Appearance Ur Clear Clear   Leukocytes, UA Negative Negative  Protein, UA Negative Negative/Trace   Glucose, UA Negative Negative   Ketones, UA Negative Negative   RBC, UA Negative Negative   Bilirubin, UA Negative Negative   Urobilinogen, Ur 0.2 0.2 - 1.0 mg/dL   Nitrite, UA Negative Negative   Microscopic Examination Comment    Microscopic Examination See below:       Assessment & Plan:   1. Chest pain, unspecified type - EKG 12-Lead - DG Chest 2 View; Future - azithromycin (ZITHROMAX Z-PAK) 250 MG tablet; Take as directed  Dispense: 6 each; Refill: 0 - CBC with Differential/Platelet - CMP14+EGFR - Urine Culture - Urinalysis, Complete  2. Acute bilateral thoracic back pain - predniSONE (STERAPRED UNI-PAK 48 TAB) 10 MG (48) TBPK tablet; Take 12 day pack as directed  Dispense: 48 tablet; Refill: 0 - azithromycin (ZITHROMAX Z-PAK) 250 MG tablet; Take as directed  Dispense: 6 each; Refill: 0 - CBC with Differential/Platelet - CMP14+EGFR - Urine Culture - Urinalysis, Complete  3. Lumbar pain - Microscopic Examination   Continue all other maintenance medications as listed above.  Follow up plan: Return in about 2 weeks (around 11/19/2017) for patient's PCP for recheck.  Educational handout given for [place] PA-C [place] [address]  [place], [place] [address] [phone]   11/08/2017, 10:21 PM

## 2017-12-16 ENCOUNTER — Ambulatory Visit (INDEPENDENT_AMBULATORY_CARE_PROVIDER_SITE_OTHER): Payer: BLUE CROSS/BLUE SHIELD | Admitting: Internal Medicine

## 2017-12-16 ENCOUNTER — Encounter: Payer: Self-pay | Admitting: Internal Medicine

## 2017-12-16 VITALS — BP 130/88 | HR 60 | Ht 75.0 in | Wt 233.8 lb

## 2017-12-16 DIAGNOSIS — Y93H3 Activity, building and construction: Secondary | ICD-10-CM | POA: Diagnosis not present

## 2017-12-16 DIAGNOSIS — Z87891 Personal history of nicotine dependence: Secondary | ICD-10-CM | POA: Diagnosis not present

## 2017-12-16 DIAGNOSIS — R053 Chronic cough: Secondary | ICD-10-CM

## 2017-12-16 DIAGNOSIS — R059 Cough, unspecified: Secondary | ICD-10-CM

## 2017-12-16 DIAGNOSIS — R9389 Abnormal findings on diagnostic imaging of other specified body structures: Secondary | ICD-10-CM

## 2017-12-16 DIAGNOSIS — R05 Cough: Secondary | ICD-10-CM | POA: Diagnosis not present

## 2017-12-16 DIAGNOSIS — R0609 Other forms of dyspnea: Secondary | ICD-10-CM | POA: Diagnosis not present

## 2017-12-16 NOTE — Progress Notes (Signed)
Subjective:     Patient ID: Richard Moore, male   DOB: Feb 06, 1961, 57 y.o.   MRN: [id number]  PCP Chevis Pretty, FNP  HPI  IOV 12/16/2017  Chief Complaint  Patient presents with  . Consult    COPD per [facility]. Pt last saw MR 06/22/12.  Pt does have c/o cough sometimes with brown to yellow mucus.    57 year old male who says that in 2013 I took care of him for pneumonia.  Review of the chart and visualization shows that he had CT scan evidence of left lower lobe pneumonia in 2013.  After that he had complete recovery but he says clinically he did not follow-up with me.  Recently went and saw her primary care physician according to his history for left upper back pain along with lower back mid spinal area pain that is chronic.  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  As part of this evaluation for pain he had a chest x-ray that then they reported to him he has COPD [I visualized the chest x-ray from February 2019 and confirmed the presence of hyperinflation and right lower lobe atelectasis] therefore he has been referred here.  He says he that he does not have any symptoms but when I started questioning him again he started admitting to chronic cough for many years.  It happens early in the morning when he brushes the teeth.  His wife notices it.  It does not bother him.  Therefore it is mild to moderate in intensity.  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  Several years ago this was not the case.  There are no other symptoms.  Med review history shows associated lisinopril intake for many years.  CAT COPD Symptom & Quality of Life Score (GSK trademark) 0 is no burden. 5 is highest burden 12/16/2017   Never Cough -> Cough all the time 3  No phlegm in chest -> Chest is full of phlegm 2  No chest tightness -> Chest  feels very tight 0  No dyspnea for 1 flight stairs/hill -> Very dyspneic for 1 flight of stairs 1  No limitations for ADL at home -> Very limited with ADL at home 0  Confident leaving home -> Not at all confident leaving home 0  Sleep soundly -> Do not sleep soundly because of lung condition 5  Lots of Energy -> No energy at all 1  TOTAL Score (max 40)  12    Results for Richard Moore, Richard Moore (MRN [id number]) as of 12/16/2017 09:30  Ref. Range 11/05/2017 17:19  Creatinine Latest Ref Range: 0.76 - 1.27 mg/dL 0.94   Results for Richard Moore, Richard Moore (MRN [id number]) as of 12/16/2017 09:30  Ref. Range 11/05/2017 17:19  Hemoglobin Latest Ref Range: 13.0 - 17.7 g/dL 16.1      has a past medical history of GERD (gastroesophageal reflux disease), Hyperlipidemia, Hypertension, Pneumonia, and Sleep apnea.   reports that he quit smoking about 10 years ago. His smoking use included cigarettes. He has a 30.00 pack-year smoking history. He has never used smokeless tobacco.  Past Surgical History:  Procedure Laterality Date  . COLONOSCOPY    . INGUINAL HERNIA REPAIR     right  . VASECTOMY      No Known Allergies   There is no immunization history  on file for this patient.  Family History  Problem Relation Age of Onset  . Lung cancer Father        23 years ago  . Colon cancer Neg Hx   . Esophageal cancer Neg Hx   . Rectal cancer Neg Hx   . Stomach cancer Neg Hx      Current Outpatient Medications:  .  atorvastatin (LIPITOR) 40 MG tablet, Take 1 tablet (40 mg total) by mouth daily., Disp: 30 tablet, Rfl: 5 .  ezetimibe (ZETIA) 10 MG tablet, Take 1 tablet (10 mg total) by mouth daily., Disp: 90 tablet, Rfl: 1 .  lisinopril-hydrochlorothiazide (PRINZIDE,ZESTORETIC) 10-12.5 MG tablet, Take 1 tablet by mouth daily., Disp: 90 tablet, Rfl: 1 .  donepezil (ARICEPT) 10 MG tablet, Take 1 tablet (10 mg total) by mouth at bedtime. (Patient not taking: Reported on 12/16/2017), Disp: 30 tablet, Rfl:  2  Current Facility-Administered Medications:  .  0.9 %  sodium chloride infusion, 500 mL, Intravenous, Continuous, Milus Banister, MD    Review of Systems     Objective:   Physical Exam  Constitutional: He is oriented to person, place, and time. He appears well-developed and well-nourished. No distress.  HENT:  Head: Normocephalic and atraumatic.  Right Ear: External ear normal.  Left Ear: External ear normal.  Mouth/Throat: Oropharynx is clear and moist. No oropharyngeal exudate.  Eyes: Pupils are equal, round, and reactive to light. Conjunctivae and EOM are normal. Right eye exhibits no discharge. Left eye exhibits no discharge. No scleral icterus.  Neck: Normal range of motion. Neck supple. No JVD present. No tracheal deviation present. No thyromegaly present.  Cardiovascular: Normal rate, regular rhythm and intact distal pulses. Exam reveals no gallop and no friction rub.  No murmur heard. Pulmonary/Chest: Effort normal and breath sounds normal. No respiratory distress. He has no wheezes. He has no rales. He exhibits no tenderness.  Mild RLL crackles ? LLL cracjles - not sure  Abdominal: Soft. Bowel sounds are normal. He exhibits no distension and no mass. There is no tenderness. There is no rebound and no guarding.  Musculoskeletal: Normal range of motion. He exhibits no edema or tenderness.  Lymphadenopathy:    He has no cervical adenopathy.  Neurological: He is alert and oriented to person, place, and time. He has normal reflexes. No cranial nerve deficit. Coordination normal.  Skin: Skin is warm and dry. No rash noted. He is not diaphoretic. No erythema. No pallor.  Psychiatric: He has a normal mood and affect. His behavior is normal. Judgment and thought content normal.  Nursing note and vitals reviewed.  Vitals:   12/16/17 0906  BP: 130/88  Pulse: 60  SpO2: 98%  Weight: 233 lb 12.8 oz (106.1 kg)  Height: 6\' 3"  (1.905 m)    Estimated body mass index is 29.22  kg/m as calculated from the following:   Height as of this encounter: 6\' 3"  (1.905 m).   Weight as of this encounter: 233 lb 12.8 oz (106.1 kg).      Assessment:       ICD-10-CM   1. Chronic cough R05   2. Dyspnea on exertion R06.09   3. Activities involving building and Optometrist.H3   4. Stopped smoking with greater than 30 pack year history Z87.891   5. Abnormal CXR R93.89        Plan:       Talk to PCP Hassell Done, Mary-Margaret, FNP and stop lisinopril  - instead recommend amlodpine Do HRCT  supine and prone  FOllowup - next 2-3 weeks but after completion of the above; can see APP    Dr. Brand Males, M.D., [facility].C.P Pulmonary and Critical Care Medicine Staff Physician, [place] Director - Interstitial Lung Disease  Program  Pulmonary [place] [facility] [place], [place], [address]  Pager: [phone], If no answer or between  15:00h - 7:00h: call [phone] Telephone: [phone]

## 2017-12-16 NOTE — Patient Instructions (Addendum)
ICD-10-CM   1. Chronic cough R05   2. Dyspnea on exertion R06.09   3. Activities involving building and Optometrist.H3   4. Stopped smoking with greater than 30 pack year history Z87.891   5. Abnormal CXR R93.89     Talk to PCP Hassell Done, Mary-Margaret, FNP and stop lisinopril  - instead recommend amlodpine Do HRCT supine and prone  FOllowup - next 2-3 weeks but after completion of the above; can see APP

## 2017-12-31 ENCOUNTER — Ambulatory Visit (INDEPENDENT_AMBULATORY_CARE_PROVIDER_SITE_OTHER)
Admission: RE | Admit: 2017-12-31 | Discharge: 2017-12-31 | Disposition: A | Discharge status: Home / Self Care | Payer: BLUE CROSS/BLUE SHIELD | Source: Ambulatory Visit | Attending: Internal Medicine | Admitting: Internal Medicine

## 2017-12-31 DIAGNOSIS — R059 Cough, unspecified: Secondary | ICD-10-CM

## 2017-12-31 DIAGNOSIS — R05 Cough: Secondary | ICD-10-CM | POA: Diagnosis not present

## 2017-12-31 DIAGNOSIS — J449 Chronic obstructive pulmonary disease, unspecified: Secondary | ICD-10-CM | POA: Diagnosis not present

## 2018-01-28 ENCOUNTER — Ambulatory Visit: Payer: Self-pay | Admitting: Internal Medicine

## 2018-02-01 ENCOUNTER — Telehealth: Payer: Self-pay | Admitting: Internal Medicine

## 2018-02-01 DIAGNOSIS — J438 Other emphysema: Secondary | ICD-10-CM

## 2018-02-01 NOTE — Telephone Encounter (Signed)
   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 No lung volume or bd response. No post-bd spiro - can be done day of test

## 2018-02-09 NOTE — Telephone Encounter (Signed)
lmtcb for pt. Orders have been placed for labs and PFT.

## 2018-02-15 NOTE — Telephone Encounter (Signed)
Attempted to call pt but no answer. Left message for pt to return call. 

## 2018-02-15 NOTE — Telephone Encounter (Signed)
Pt is calling back [phone]

## 2018-02-16 NOTE — Telephone Encounter (Signed)
Called and spoke with pt letting him know the results of the ct scan and that MR wanted him to have labwork done 5 days prior to pt's OV and for pt to also have a PFT done as well.  Pt expressed understanding. Stated to pt to come to lab dept to have the labwork done so that we would have results back at his OV 6/28.  Rescheduled pt's OV at 4:15 so that way he could have PFT done at 3pm before the OV.  Nothing further needed.

## 2018-02-25 ENCOUNTER — Ambulatory Visit: Payer: Self-pay | Admitting: Internal Medicine

## 2018-03-11 ENCOUNTER — Ambulatory Visit: Payer: Self-pay | Admitting: Internal Medicine

## 2018-09-11 DIAGNOSIS — J209 Acute bronchitis, unspecified: Secondary | ICD-10-CM | POA: Diagnosis not present

## 2018-09-11 DIAGNOSIS — I10 Essential (primary) hypertension: Secondary | ICD-10-CM | POA: Diagnosis not present

## 2018-09-19 ENCOUNTER — Telehealth: Payer: Self-pay | Admitting: Nurse Practitioner

## 2018-09-19 DIAGNOSIS — E785 Hyperlipidemia, unspecified: Secondary | ICD-10-CM

## 2018-09-19 DIAGNOSIS — I10 Essential (primary) hypertension: Secondary | ICD-10-CM

## 2018-09-19 MED ORDER — LISINOPRIL-HYDROCHLOROTHIAZIDE 10-12.5 MG PO TABS: 1.0000 | ORAL_TABLET | Freq: Every day | ORAL | 1 refills | Status: DC

## 2018-09-19 MED ORDER — EZETIMIBE 10 MG PO TABS: 10.0000 mg | ORAL_TABLET | Freq: Every day | ORAL | 1 refills | Status: DC

## 2018-09-19 NOTE — Telephone Encounter (Signed)
Pt aware.

## 2018-09-19 NOTE — Telephone Encounter (Signed)
Ins will not pay for him to get early- will hav eto pay cash- rx sent in

## 2018-11-09 ENCOUNTER — Telehealth: Payer: Self-pay | Admitting: Nurse Practitioner

## 2018-11-09 DIAGNOSIS — I10 Essential (primary) hypertension: Secondary | ICD-10-CM

## 2018-11-09 MED ORDER — LISINOPRIL-HYDROCHLOROTHIAZIDE 10-12.5 MG PO TABS: 1.0000 | ORAL_TABLET | Freq: Every day | ORAL | 1 refills | Status: DC

## 2018-11-09 NOTE — Telephone Encounter (Signed)
What is the name of the medication? lisinopril-hydrochlorothiazide (PRINZIDE,ZESTORETIC) 10-12.5 MG tablet and zetia  Have you contacted your pharmacy to request a refill? No he should have 90 day supply but has not received and it getting [facility]  [facility]   Patient notified that their request is being sent to the clinical staff for review and that they should receive a call once it is complete. If they do not receive a call within 24 hours they can check with their pharmacy or our office.

## 2018-11-09 NOTE — Telephone Encounter (Signed)
Prescription for Lisinopril/HCTZ sent to [facility]

## 2018-11-24 ENCOUNTER — Encounter: Payer: Self-pay | Admitting: Nurse Practitioner

## 2018-11-24 ENCOUNTER — Other Ambulatory Visit: Payer: Self-pay

## 2018-11-24 ENCOUNTER — Ambulatory Visit (INDEPENDENT_AMBULATORY_CARE_PROVIDER_SITE_OTHER): Payer: BLUE CROSS/BLUE SHIELD | Admitting: Nurse Practitioner

## 2018-11-24 VITALS — BP 135/81 | HR 76 | Temp 97.5°F | Ht 75.0 in | Wt 252.0 lb

## 2018-11-24 DIAGNOSIS — Z6830 Body mass index (BMI) 30.0-30.9, adult: Secondary | ICD-10-CM

## 2018-11-24 DIAGNOSIS — Z0000 Encounter for general adult medical examination without abnormal findings: Secondary | ICD-10-CM

## 2018-11-24 DIAGNOSIS — I10 Essential (primary) hypertension: Secondary | ICD-10-CM | POA: Diagnosis not present

## 2018-11-24 DIAGNOSIS — C61 Malignant neoplasm of prostate: Secondary | ICD-10-CM | POA: Diagnosis not present

## 2018-11-24 DIAGNOSIS — Z0001 Encounter for general adult medical examination with abnormal findings: Secondary | ICD-10-CM | POA: Diagnosis not present

## 2018-11-24 DIAGNOSIS — R413 Other amnesia: Secondary | ICD-10-CM | POA: Insufficient documentation

## 2018-11-24 DIAGNOSIS — E785 Hyperlipidemia, unspecified: Secondary | ICD-10-CM | POA: Diagnosis not present

## 2018-11-24 MED ORDER — DONEPEZIL HCL 10 MG PO TABS: 10.0000 mg | ORAL_TABLET | Freq: Every day | ORAL | 2 refills | Status: DC

## 2018-11-24 MED ORDER — EZETIMIBE 10 MG PO TABS: 10.0000 mg | ORAL_TABLET | Freq: Every day | ORAL | 1 refills | Status: DC

## 2018-11-24 MED ORDER — LISINOPRIL-HYDROCHLOROTHIAZIDE 10-12.5 MG PO TABS: 1.0000 | ORAL_TABLET | Freq: Every day | ORAL | 1 refills | Status: DC

## 2018-11-24 MED ORDER — ATORVASTATIN CALCIUM 40 MG PO TABS: 40.0000 mg | ORAL_TABLET | Freq: Every day | ORAL | 5 refills | Status: DC

## 2018-11-24 NOTE — Progress Notes (Signed)
Subjective:    Patient ID: Richard Moore, male    DOB: 1961-04-11, 58 y.o.   MRN: [id number]   Chief Complaint: Annual Exam   HPI:  1. Annual physical exam   2. Essential hypertension  No c/o chest pain, sob or headache. Does not check blood pressure at home.. BP Readings from Last 3 Encounters:  11/24/18 135/81  12/16/17 130/88  11/05/17 129/80     3. Hyperlipidemia with target LDL less than 100  Does not watch diet and does no exercise. He does have a very physical job.  4. BMI 30.0-30.9,adult  No recent weight changes  5. Memory disturbance  He has had issues with his memory for over a year. He was given an aricept rx but he never started taking it. His wife says he is forgetting a lot of things. Loses things frequently. Thinks that his main issue is with short term memory.    Outpatient Encounter Medications as of 11/24/2018  Medication Sig  . atorvastatin (LIPITOR) 40 MG tablet Take 1 tablet (40 mg total) by mouth daily.  Marland Kitchen ezetimibe (ZETIA) 10 MG tablet Take 1 tablet (10 mg total) by mouth daily.  Marland Kitchen lisinopril-hydrochlorothiazide (PRINZIDE,ZESTORETIC) 10-12.5 MG tablet Take 1 tablet by mouth daily.  Marland Kitchen donepezil (ARICEPT) 10 MG tablet Take 1 tablet (10 mg total) by mouth at bedtime. 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   Review of Systems  Constitutional: Negative for activity change and appetite change.  HENT: Negative.   Eyes: Negative for pain.  Respiratory: Negative for shortness of breath.   Cardiovascular: Negative for chest pain, palpitations and leg swelling.  Gastrointestinal: Negative for abdominal pain.  Endocrine: Negative for polydipsia.  Genitourinary: Negative.   Skin: Negative for rash.  Neurological: Negative for dizziness, weakness and headaches.  Hematological: Does not bruise/bleed easily.  Psychiatric/Behavioral: Negative.    All other systems reviewed and are negative.      Objective:   Physical Exam Vitals signs and nursing note reviewed.  Constitutional:      Appearance: Normal appearance. He is well-developed.  HENT:     Head: Normocephalic.     Nose: Nose normal.  Eyes:     Pupils: Pupils are equal, round, and reactive to light.  Neck:     Musculoskeletal: Normal range of motion and neck supple.     Thyroid: No thyroid mass or thyromegaly.     Vascular: No carotid bruit or JVD.     Trachea: Phonation normal.  Cardiovascular:     Rate and Rhythm: Normal rate and regular rhythm.  Pulmonary:     Effort: Pulmonary effort is normal. No respiratory distress.     Breath sounds: Normal breath sounds.  Abdominal:     General: Bowel sounds are normal.     Palpations: Abdomen is soft.     Tenderness: There is no abdominal tenderness.  Musculoskeletal: Normal range of motion.  Lymphadenopathy:     Cervical: No cervical adenopathy.  Skin:    General: Skin is warm and dry.  Neurological:     Mental Status: He is alert and oriented to person, place, and time.  Psychiatric:        Behavior: Behavior normal.        Thought Content: Thought content normal.        Judgment: Judgment normal.    BP 135/81  Pulse 76   Temp (!) 97.5 F (36.4 C) (Oral)   Ht 6' 3"  (1.905 m)   Wt 252 lb (114.3 kg)   BMI 31.50 kg/m       Assessment & Plan:  Muad Noga comes in today with chief complaint of Annual Exam   Diagnosis and orders addressed:  1. Annual physical exam  2. Essential hypertension Low sodium diet - lisinopril-hydrochlorothiazide (PRINZIDE,ZESTORETIC) 10-12.5 MG tablet; Take 1 tablet by mouth daily.  Dispense: 90 tablet; Refill: 1 - CMP14+EGFR  3. Hyperlipidemia with target LDL less than 100 Low fat diet - ezetimibe (ZETIA) 10 MG tablet; Take 1 tablet (10 mg total) by mouth daily.  Dispense: 90 tablet; Refill: 1 - atorvastatin (LIPITOR) 40 MG tablet; Take 1 tablet (40 mg  total) by mouth daily.  Dispense: 30 tablet; Refill: 5 - Lipid panel  4. BMI 30.0-30.9,adult Discussed diet and exercise for person with BMI >25 Will recheck weight in 3-6 months  5. Memory disturbance Will start on aricept - donepezil (ARICEPT) 10 MG tablet; Take 1 tablet (10 mg total) by mouth at bedtime.  Dispense: 30 tablet; Refill: 2  6. Prostate cancer ([place]) - PSA, total and free   Labs pending Health Maintenance reviewed Diet and exercise encouraged  Follow up plan: 6 months   [place], FNP

## 2018-11-24 NOTE — Patient Instructions (Signed)

## 2018-11-25 LAB — CMP14+EGFR
A/G RATIO: 2.4 — AB (ref 1.2–2.2)
ALT: 72 IU/L — AB (ref 0–44)
AST: 39 IU/L (ref 0–40)
Albumin: 4.7 g/dL (ref 3.8–4.9)
Alkaline Phosphatase: 37 IU/L — ABNORMAL LOW (ref 39–117)
BILIRUBIN TOTAL: 0.6 mg/dL (ref 0.0–1.2)
BUN/Creatinine Ratio: 13 (ref 9–20)
BUN: 13 mg/dL (ref 6–24)
CHLORIDE: 100 mmol/L (ref 96–106)
CO2: 24 mmol/L (ref 20–29)
Calcium: 9.6 mg/dL (ref 8.7–10.2)
Creatinine, Ser: 0.97 mg/dL (ref 0.76–1.27)
GFR calc Af Amer: 100 mL/min/{1.73_m2} (ref >59)
GFR calc non Af Amer: 87 mL/min/{1.73_m2} (ref >59)
Globulin, Total: 2 g/dL (ref 1.5–4.5)
Glucose: 93 mg/dL (ref 65–99)
POTASSIUM: 4.1 mmol/L (ref 3.5–5.2)
SODIUM: 141 mmol/L (ref 134–144)
Total Protein: 6.7 g/dL (ref 6.0–8.5)

## 2018-11-25 LAB — LIPID PANEL
Chol/HDL Ratio: 4.5 ratio (ref 0.0–5.0)
Cholesterol, Total: 190 mg/dL (ref 100–199)
HDL: 42 mg/dL (ref >39)
LDL Calculated: 102 mg/dL — ABNORMAL HIGH (ref 0–99)
TRIGLYCERIDES: 229 mg/dL — AB (ref 0–149)
VLDL Cholesterol Cal: 46 mg/dL — ABNORMAL HIGH (ref 5–40)

## 2018-11-25 LAB — PSA, TOTAL AND FREE
PROSTATE SPECIFIC AG, SERUM: 0.3 ng/mL (ref 0.0–4.0)
PSA FREE PCT: 36.7 %
PSA, Free: 0.11 ng/mL

## 2018-11-28 DIAGNOSIS — J189 Pneumonia, unspecified organism: Secondary | ICD-10-CM | POA: Diagnosis not present

## 2019-01-18 IMAGING — DX DG CHEST 2V
2 series · 2 of 2 positions shown · non-contrast
Comparison: Chest radiograph June 30, 2013

CLINICAL DATA: Chest pain and cough.

EXAM:
CHEST  2 VIEW

[chest pa]
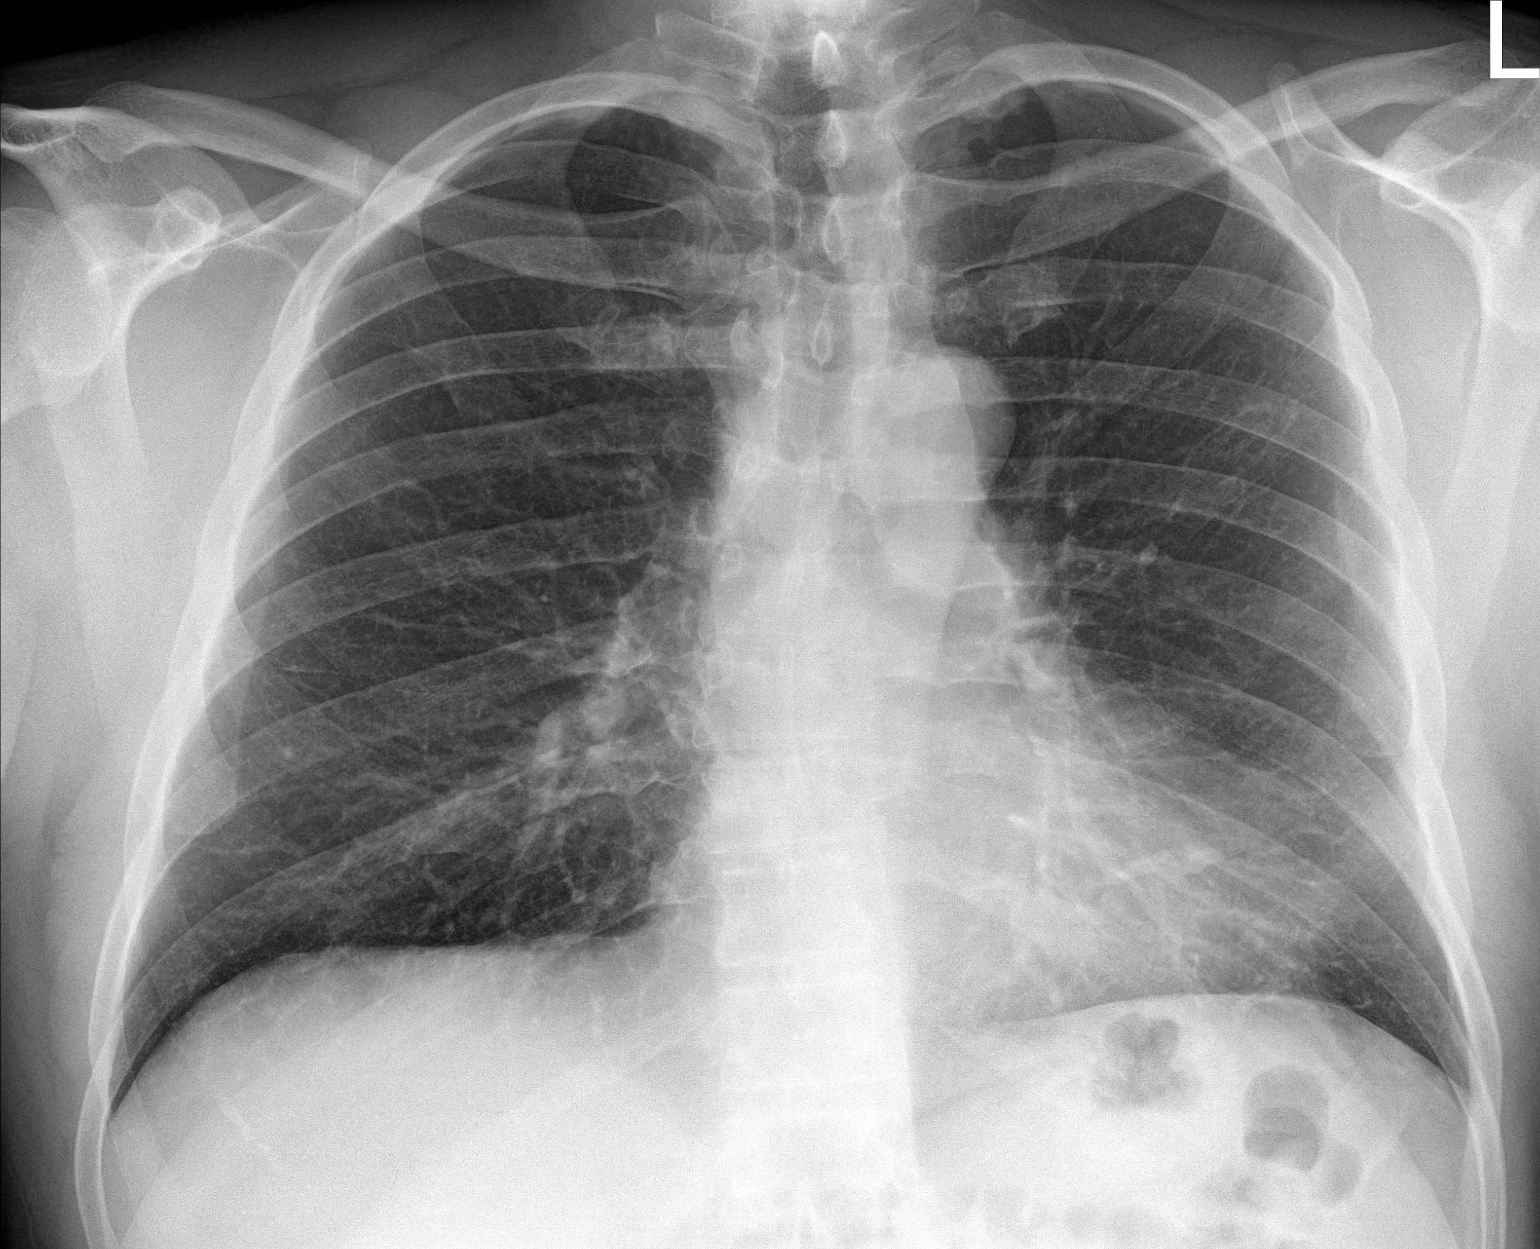

[chest lat]
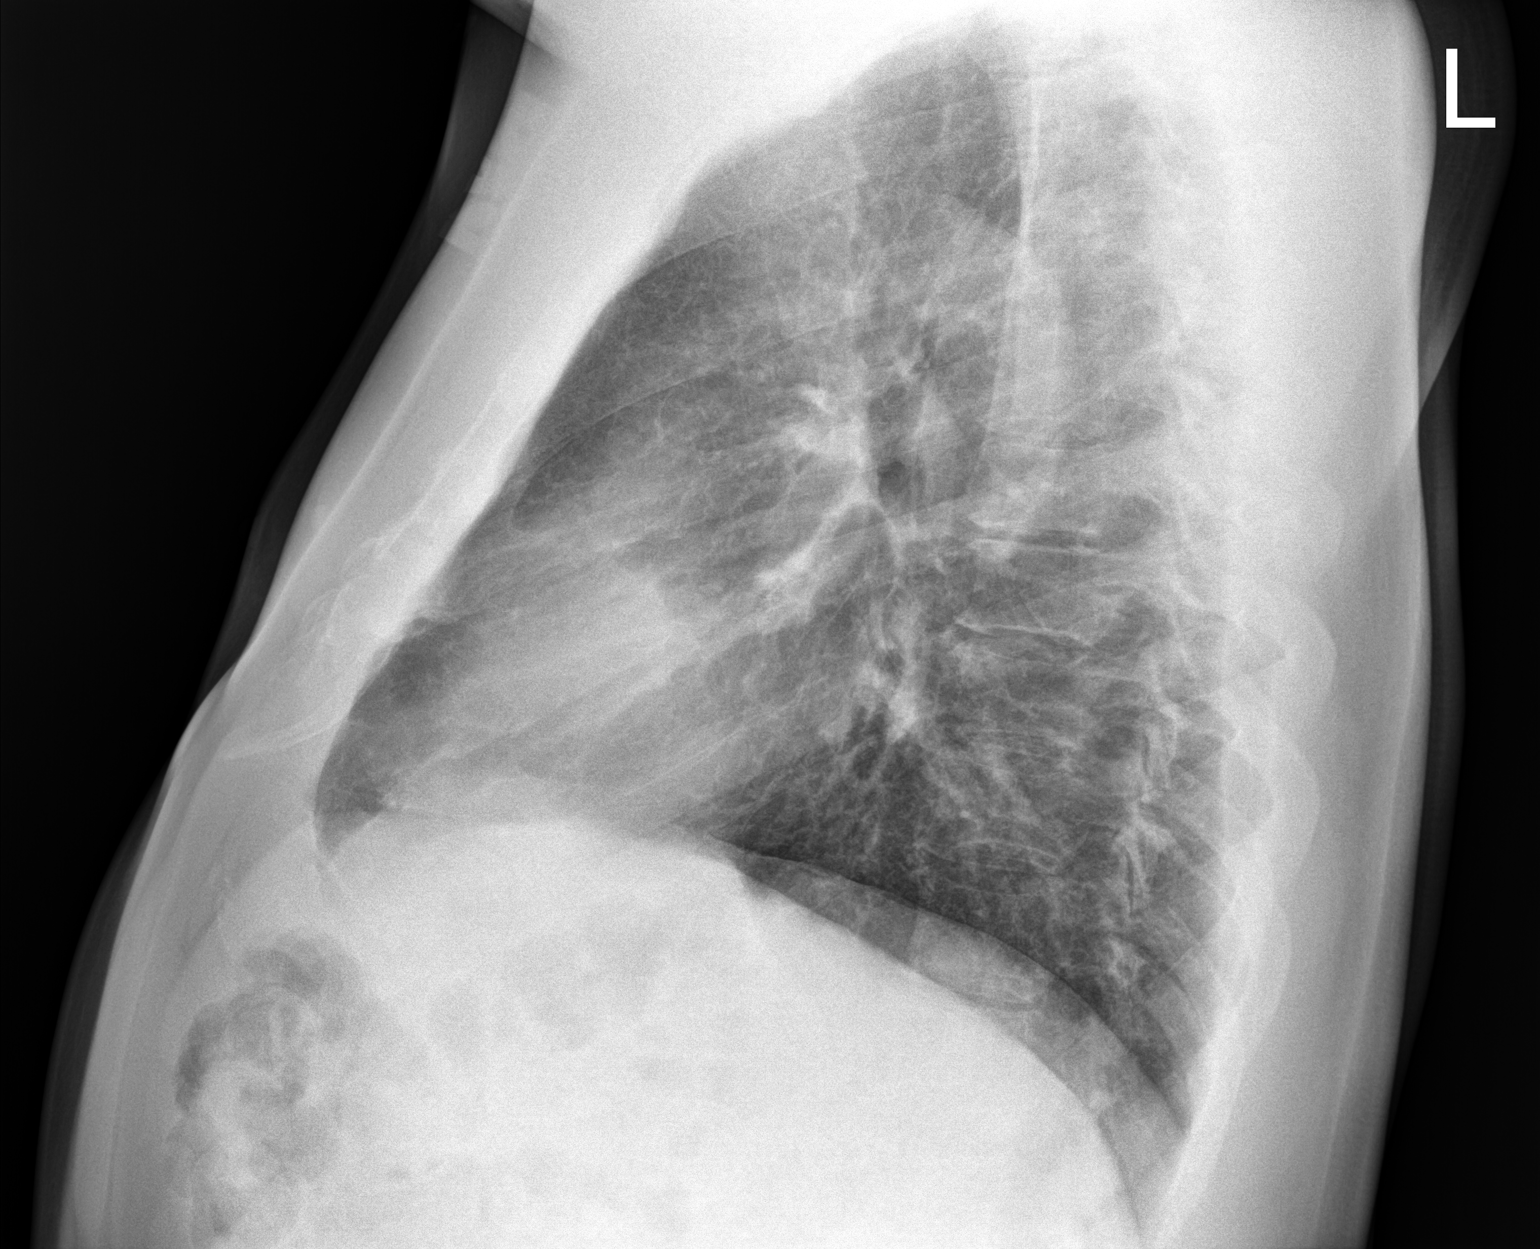

[2 of 2 positions shown; findings below may reference images not displayed]

FINDINGS: Cardiomediastinal silhouette is normal. No pleural effusions or
focal consolidations. Similar mild interstitial prominence. Strandy
densities RIGHT lung base. Trachea projects midline and there is no
pneumothorax. Soft tissue planes and included osseous structures are
non-suspicious.
IMPRESSION: Mild suspected COPD with RIGHT lung base atelectasis.

## 2019-01-26 ENCOUNTER — Telehealth: Payer: Self-pay | Admitting: Nurse Practitioner

## 2019-08-29 ENCOUNTER — Encounter: Payer: Self-pay | Admitting: Gastroenterology

## 2019-09-16 ENCOUNTER — Other Ambulatory Visit: Payer: Self-pay | Admitting: Nurse Practitioner

## 2019-09-16 DIAGNOSIS — E785 Hyperlipidemia, unspecified: Secondary | ICD-10-CM

## 2019-09-16 DIAGNOSIS — I10 Essential (primary) hypertension: Secondary | ICD-10-CM

## 2019-09-22 ENCOUNTER — Encounter: Payer: Self-pay | Admitting: Gastroenterology

## 2019-10-06 ENCOUNTER — Encounter: Payer: BLUE CROSS/BLUE SHIELD | Admitting: Gastroenterology

## 2019-10-27 ENCOUNTER — Other Ambulatory Visit: Payer: Self-pay

## 2019-10-27 ENCOUNTER — Ambulatory Visit (AMBULATORY_SURGERY_CENTER): Payer: Self-pay | Admitting: *Deleted

## 2019-10-27 VITALS — Temp 98.0°F | Ht 75.0 in | Wt 258.8 lb

## 2019-10-27 DIAGNOSIS — Z01818 Encounter for other preprocedural examination: Secondary | ICD-10-CM

## 2019-10-27 DIAGNOSIS — Z86010 Personal history of colonic polyps: Secondary | ICD-10-CM

## 2019-10-27 MED ORDER — SUPREP BOWEL PREP KIT 17.5-3.13-1.6 GM/177ML PO SOLN: 1.0000 | Freq: Once | ORAL | 0 refills | Status: AC

## 2019-10-27 NOTE — Progress Notes (Signed)
Pt is aware that care partner will wait in the car during procedure; if they feel like they will be too hot or cold to wait in the car; they may wait in the 4 th floor lobby. Patient is aware to bring only one care partner. We want them to wear a mask (we do not have any that we can provide them), practice social distancing, and we will check their temperatures when they get here.  I did remind the patient that their care partner needs to stay in the parking lot the entire time and have a cell phone available, we will call them when the pt is ready for discharge. Patient will wear mask into building.  covid test 11-07-19 at 11:30 am  [employer] coupon given  No egg or soy allergy  No home oxygen use or problems with anesthesia  No medications for weight loss taken  emmi information given

## 2019-11-07 ENCOUNTER — Ambulatory Visit (INDEPENDENT_AMBULATORY_CARE_PROVIDER_SITE_OTHER): Payer: BC Managed Care – PPO

## 2019-11-07 ENCOUNTER — Other Ambulatory Visit: Payer: Self-pay | Admitting: Gastroenterology

## 2019-11-07 DIAGNOSIS — Z1159 Encounter for screening for other viral diseases: Secondary | ICD-10-CM | POA: Diagnosis not present

## 2019-11-07 LAB — SARS CORONAVIRUS 2 (TAT 6-24 HRS): SARS Coronavirus 2: NEGATIVE

## 2019-11-08 ENCOUNTER — Encounter: Payer: Self-pay | Admitting: Gastroenterology

## 2019-11-10 ENCOUNTER — Encounter: Payer: Self-pay | Admitting: Gastroenterology

## 2019-11-10 ENCOUNTER — Other Ambulatory Visit: Payer: Self-pay

## 2019-11-10 ENCOUNTER — Ambulatory Visit (AMBULATORY_SURGERY_CENTER): Payer: BC Managed Care – PPO | Admitting: Gastroenterology

## 2019-11-10 VITALS — BP 125/72 | HR 55 | Temp 97.5°F | Resp 15 | Ht 75.0 in | Wt 258.0 lb

## 2019-11-10 DIAGNOSIS — Z86010 Personal history of colonic polyps: Secondary | ICD-10-CM | POA: Diagnosis not present

## 2019-11-10 DIAGNOSIS — D124 Benign neoplasm of descending colon: Secondary | ICD-10-CM

## 2019-11-10 DIAGNOSIS — Z1211 Encounter for screening for malignant neoplasm of colon: Secondary | ICD-10-CM | POA: Diagnosis not present

## 2019-11-10 DIAGNOSIS — D122 Benign neoplasm of ascending colon: Secondary | ICD-10-CM | POA: Diagnosis not present

## 2019-11-10 DIAGNOSIS — K635 Polyp of colon: Secondary | ICD-10-CM | POA: Diagnosis not present

## 2019-11-10 MED ORDER — SODIUM CHLORIDE 0.9 % IV SOLN: 500.0000 mL | INTRAVENOUS | Status: DC

## 2019-11-10 NOTE — Progress Notes (Signed)
Report to PACU, RN, vss, BBS= Clear.  

## 2019-11-10 NOTE — Op Note (Signed)
[place] Patient Name: Richard Moore Procedure Date: 11/10/2019 7:24 AM MRN: [id number] Endoscopist: Milus Banister , MD Age: 59 Referring MD:  Date of Birth: 09/17/1960 Gender: Male Account #: [id number] Procedure:                Colonoscopy Indications:              High risk colon cancer surveillance: Personal                            history of colonic polyps; 4 subCM adenomas 2009, 1                            subCM adenoma 2012; colonoscopy 09/2016 4 subCM                            adenomas Medicines:                Monitored Anesthesia Care Procedure:                Pre-Anesthesia Assessment:                           - Prior to the procedure, a History and Physical                            was performed, and patient medications and                            allergies were reviewed. The patient's tolerance of                            previous anesthesia was also reviewed. The risks                            and benefits of the procedure and the sedation                            options and risks were discussed with the patient.                            All questions were answered, and informed consent                            was obtained. Prior Anticoagulants: The patient has                            taken no previous anticoagulant or antiplatelet                            agents. ASA Grade Assessment: II - A patient with                            mild systemic disease. After reviewing the risks  and benefits, the patient was deemed in                            satisfactory condition to undergo the procedure.                           After obtaining informed consent, the colonoscope                            was passed under direct vision. Throughout the                            procedure, the patient's blood pressure, pulse, and                            oxygen saturations were monitored continuously. The                         Colonoscope was introduced through the anus and                            advanced to the the cecum, identified by                            appendiceal orifice and ileocecal valve. The                            colonoscopy was performed without difficulty. The                            patient tolerated the procedure well. The quality                            of the bowel preparation was good. The ileocecal                            valve, appendiceal orifice, and rectum were                            photographed. Scope In: 8:00:42 AM Scope Out: 8:15:28 AM Scope Withdrawal Time: 0 hours 11 minutes 22 seconds  Total Procedure Duration: 0 hours 14 minutes 46 seconds  Findings:                 Two sessile polyps were found in the descending                            colon and ascending colon. The polyps were 3 to 5                            mm in size. These polyps were removed with a cold                            snare. Resection and retrieval were complete.  Multiple small and large-mouthed diverticula were                            found in the left colon.                           External and internal hemorrhoids were found. The                            hemorrhoids were small.                           The exam was otherwise without abnormality on                            direct and retroflexion views. Complications:            No immediate complications. Estimated blood loss:                            None. Estimated Blood Loss:     Estimated blood loss: none. Impression:               - Two 3 to 5 mm polyps in the descending colon and                            in the ascending colon, removed with a cold snare.                            Resected and retrieved.                           - Diverticulosis in the left colon.                           - External and internal hemorrhoids.                           - The  examination was otherwise normal on direct                            and retroflexion views. Recommendation:           - Patient has a contact number available for                            emergencies. The signs and symptoms of potential                            delayed complications were discussed with the                            patient. Return to normal activities tomorrow.                            Written discharge instructions were provided to the  patient.                           - Resume previous diet.                           - Continue present medications.                           - Await pathology results. Milus Banister, MD 11/10/2019 8:18:50 AM This report has been signed electronically.

## 2019-11-10 NOTE — Progress Notes (Signed)
Temp JB V/s CW Pt's states no medical or surgical changes since previsit or office visit.

## 2019-11-10 NOTE — Patient Instructions (Signed)
Handouts provided on polyps, diverticulosis, and hemorrhoids.   YOU HAD AN ENDOSCOPIC PROCEDURE TODAY AT [facility]:   Refer to the procedure report that was given to you for any specific questions about what was found during the examination.  If the procedure report does not answer your questions, please call your gastroenterologist to clarify.  If you requested that your care partner not be given the details of your procedure findings, then the procedure report has been included in a sealed envelope for you to review at your convenience later.  YOU SHOULD EXPECT: Some feelings of bloating in the abdomen. Passage of more gas than usual.  Walking can help get rid of the air that was put into your GI tract during the procedure and reduce the bloating. If you had a lower endoscopy (such as a colonoscopy or flexible sigmoidoscopy) you may notice spotting of blood in your stool or on the toilet paper. If you underwent a bowel prep for your procedure, you may not have a normal bowel movement for a few days.  Please Note:  You might notice some irritation and congestion in your nose or some drainage.  This is from the oxygen used during your procedure.  There is no need for concern and it should clear up in a day or so.  SYMPTOMS TO REPORT IMMEDIATELY:   Following lower endoscopy (colonoscopy or flexible sigmoidoscopy):  Excessive amounts of blood in the stool  Significant tenderness or worsening of abdominal pains  Swelling of the abdomen that is new, acute  Fever of 100F or higher   For urgent or emergent issues, a gastroenterologist can be reached at any hour by calling [phone].   DIET:  We do recommend a small meal at first, but then you may proceed to your regular diet.  Drink plenty of fluids but you should avoid alcoholic beverages for 24 hours.  ACTIVITY:  You should plan to take it easy for the rest of today and you should NOT DRIVE or use heavy machinery until  tomorrow (because of the sedation medicines used during the test).    FOLLOW UP: Our staff will call the number listed on your records 48-72 hours following your procedure to check on you and address any questions or concerns that you may have regarding the information given to you following your procedure. If we do not reach you, we will leave a message.  We will attempt to reach you two times.  During this call, we will ask if you have developed any symptoms of COVID 19. If you develop any symptoms (ie: fever, flu-like symptoms, shortness of breath, cough etc.) before then, please call [phone].  If you test positive for Covid 19 in the 2 weeks post procedure, please call and report this information to [place].    If any biopsies were taken you will be contacted by phone or by letter within the next 1-3 weeks.  Please call us at [phone] if you have not heard about the biopsies in 3 weeks.    SIGNATURES/CONFIDENTIALITY: You and/or your care partner have signed paperwork which will be entered into your electronic medical record.  These signatures attest to the fact that that the information above on your After Visit Summary has been reviewed and is understood.  Full responsibility of the confidentiality of this discharge information lies with you and/or your care-partner.

## 2019-11-10 NOTE — Progress Notes (Signed)
Called to room to assist during endoscopic procedure.  Patient ID and intended procedure confirmed with present staff. Received instructions for my participation in the procedure from the performing physician.  

## 2019-11-14 ENCOUNTER — Telehealth: Payer: Self-pay

## 2019-11-14 NOTE — Telephone Encounter (Signed)
  Follow up Call-  Call back number 11/10/2019  Post procedure Call Back phone  # [phone]  Permission to leave phone message Yes  Some recent data might be hidden     Patient questions:  Do you have a fever, pain , or abdominal swelling? No. Pain Score  0 *  Have you tolerated food without any problems? Yes.    Have you been able to return to your normal activities? Yes.    Do you have any questions about your discharge instructions: Diet   No. Medications  No. Follow up visit  No.  Do you have questions or concerns about your Care? No.  Actions: * If pain score is 4 or above: 1. No action needed, pain <4.Have you developed a fever since your procedure? no  2.   Have you had an respiratory symptoms (SOB or cough) since your procedure? no  3.   Have you tested positive for COVID 19 since your procedure no  4.   Have you had any family members/close contacts diagnosed with the COVID 19 since your procedure?  no   If yes to any of these questions please route to Joylene John, RN and Alphonsa Gin, Therapist, sports.

## 2019-11-20 ENCOUNTER — Encounter: Payer: Self-pay | Admitting: Gastroenterology

## 2019-12-27 ENCOUNTER — Other Ambulatory Visit: Payer: Self-pay | Admitting: *Deleted

## 2019-12-27 DIAGNOSIS — R413 Other amnesia: Secondary | ICD-10-CM

## 2019-12-27 DIAGNOSIS — E785 Hyperlipidemia, unspecified: Secondary | ICD-10-CM

## 2019-12-27 NOTE — Telephone Encounter (Signed)
MMM NTBS LOV 11/24/18

## 2019-12-29 ENCOUNTER — Telehealth: Payer: Self-pay | Admitting: Nurse Practitioner

## 2019-12-29 NOTE — Telephone Encounter (Signed)
The patient refused to make an appt with the front office staff. Are you alright with sending in refills and then get an appt scheduled? He was last seen 11/2018

## 2020-01-01 NOTE — Telephone Encounter (Signed)
Patient needs appointment.

## 2020-01-02 NOTE — Telephone Encounter (Signed)
Called patient back advised what MMM said patient got angry said it was ridiculous. Advised patient of protocol offered Mychart video or phone visit. Patient got even more upset. He stated if she wanted him to run out of his medications that was fine with him. Asked if he wanted to speak with Manager and her said yes. Transferred to LAT. At 8:45 am.

## 2020-01-02 NOTE — Telephone Encounter (Signed)
Spoke with patient and he agreed to have televisit. Scheduled for 01/04/20 at 11:30

## 2020-01-04 ENCOUNTER — Ambulatory Visit (INDEPENDENT_AMBULATORY_CARE_PROVIDER_SITE_OTHER): Payer: BC Managed Care – PPO | Admitting: Nurse Practitioner

## 2020-01-04 ENCOUNTER — Encounter: Payer: Self-pay | Admitting: Nurse Practitioner

## 2020-01-04 DIAGNOSIS — E785 Hyperlipidemia, unspecified: Secondary | ICD-10-CM | POA: Diagnosis not present

## 2020-01-04 DIAGNOSIS — R413 Other amnesia: Secondary | ICD-10-CM

## 2020-01-04 DIAGNOSIS — Z6830 Body mass index (BMI) 30.0-30.9, adult: Secondary | ICD-10-CM | POA: Diagnosis not present

## 2020-01-04 DIAGNOSIS — I10 Essential (primary) hypertension: Secondary | ICD-10-CM | POA: Diagnosis not present

## 2020-01-04 MED ORDER — LISINOPRIL-HYDROCHLOROTHIAZIDE 10-12.5 MG PO TABS: 1.0000 | ORAL_TABLET | Freq: Every day | ORAL | 1 refills | Status: DC

## 2020-01-04 MED ORDER — ATORVASTATIN CALCIUM 40 MG PO TABS: 40.0000 mg | ORAL_TABLET | Freq: Every day | ORAL | 1 refills | Status: DC

## 2020-01-04 MED ORDER — DONEPEZIL HCL 10 MG PO TABS: 10.0000 mg | ORAL_TABLET | Freq: Every day | ORAL | 1 refills | Status: DC

## 2020-01-04 MED ORDER — EZETIMIBE 10 MG PO TABS: 10.0000 mg | ORAL_TABLET | Freq: Every day | ORAL | 1 refills | Status: DC

## 2020-01-04 NOTE — Progress Notes (Signed)
Virtual Visit via telephone Note Due to COVID-19 pandemic this visit was conducted virtually. This visit type was conducted due to national recommendations for restrictions regarding the COVID-19 Pandemic (e.g. social distancing, sheltering in place) in an effort to limit this patient's exposure and mitigate transmission in our community. All issues noted in this document were discussed and addressed.  A physical exam was not performed with this format.  I connected with Richard Moore on 01/04/20 at 11:20 by telephone and verified that I am speaking with the correct person using two identifiers. Richard Moore is currently located at at work and no one is currently with him during visit. The provider, Mary-Margaret Hassell Done, FNP is located in their office at time of visit.  I discussed the limitations, risks, security and privacy concerns of performing an evaluation and management service by telephone and the availability of in person appointments. I also discussed with the patient that there may be a patient responsible charge related to this service. The patient expressed understanding and agreed to proceed.   History and Present Illness:   Chief Complaint: medical management of chronic issues   HPI:  1. Essential hypertension No c/o chest pain, sob or headache. Does not check blood pressure at home. BP Readings from Last 3 Encounters:  11/10/19 125/72  11/24/18 135/81  12/16/17 130/88     2. Hyperlipidemia with target LDL less than 100 Does not watch diet and does very little exercise. Lab Results  Component Value Date   CHOL 190 11/24/2018   HDL 42 11/24/2018   LDLCALC 102 (H) 11/24/2018   TRIG 229 (H) 11/24/2018   CHOLHDL 4.5 11/24/2018     3. 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 His wife thinks he needs to be on antidepressant, but he does not think he needs it.  4. BMI 30.0-30.9,adult No recent weight changes Wt  Readings from Last 3 Encounters:  11/10/19 258 lb (117 kg)  10/27/19 258 lb 12.8 oz (117.4 kg)  11/24/18 252 lb (114.3 kg)   BMI Readings from Last 3 Encounters:  11/10/19 32.25 kg/m  10/27/19 32.35 kg/m  11/24/18 31.50 kg/m       Outpatient Encounter Medications as of 01/04/2020  Medication Sig  . donepezil (ARICEPT) 10 MG tablet Take 1 tablet (10 mg total) by mouth at bedtime.  Marland Kitchen ezetimibe (ZETIA) 10 MG tablet TAKE 1 TABLET DAILY  . lisinopril-hydrochlorothiazide (ZESTORETIC) 10-12.5 MG tablet TAKE 1 TABLET DAILY  . Naproxen Sodium (ALEVE PO) Take by mouth as needed.     Past Surgical History:  Procedure Laterality Date  . COLONOSCOPY    . INGUINAL HERNIA REPAIR     right  . VASECTOMY      Family History  Problem Relation Age of Onset  . Lung cancer Father        23 years ago  . Colon cancer Neg Hx   . Esophageal cancer Neg Hx   . Rectal cancer Neg Hx   . Stomach cancer Neg Hx     New complaints: None today  Social history: Works out of town. He has not been home in almost 2 months.   Controlled substance contract: n/a    Review of Systems  Constitutional: Negative for diaphoresis and weight loss.  Eyes: Negative for blurred vision, double vision and pain.  Respiratory: Negative for shortness of breath.   Cardiovascular: Negative for chest pain, palpitations, orthopnea and leg swelling.  Gastrointestinal: Negative  for abdominal pain.  Skin: Negative for rash.  Neurological: Negative for dizziness, sensory change, loss of consciousness, weakness and headaches.  Endo/Heme/Allergies: Negative for polydipsia. Does not bruise/bleed easily.  Psychiatric/Behavioral: Negative for memory loss. The patient does not have insomnia.   All other systems reviewed and are negative.    Observations/Objective: Alert and oriented- answers all questions appropriately No distress    Assessment and Plan: Richard Moore comes in today with chief complaint of  No chief complaint on file.   Diagnosis and orders addressed:  1. Essential hypertension Low sodium diet - lisinopril-hydrochlorothiazide (ZESTORETIC) 10-12.5 MG tablet; Take 1 tablet by mouth daily.  Dispense: 90 tablet; Refill: 1  2. Hyperlipidemia with target LDL less than 100 Low fat diet - ezetimibe (ZETIA) 10 MG tablet; Take 1 tablet (10 mg total) by mouth daily.  Dispense: 90 tablet; Refill: 1 - atorvastatin (LIPITOR) 40 MG tablet; Take 1 tablet (40 mg total) by mouth daily.  Dispense: 90 tablet; Refill: 1  3. Memory disturbance Read daily - donepezil (ARICEPT) 10 MG tablet; Take 1 tablet (10 mg total) by mouth at bedtime.  Dispense: 90 tablet; Refill: 1  4. BMI 30.0-30.9,adult Discussed diet and exercise for person with BMI >25 Will recheck weight in 3-6 months   Labs pending- patient [place] the [place] time he is in town for blood work Health Maintenance reviewed Diet and exercise encouraged   Follow Up Instructions: 3  months    I discussed the assessment and treatment plan with the patient. The patient was provided an opportunity to ask questions and all were answered. The patient agreed with the plan and demonstrated an understanding of the instructions.   The patient was advised to call back or seek an in-person evaluation if the symptoms worsen or if the condition fails to improve as anticipated.  The above assessment and management plan was discussed with the patient. The patient verbalized understanding of and has agreed to the management plan. Patient is aware to call the clinic if symptoms persist or worsen. Patient is aware when to return to the clinic for a follow-up visit. Patient educated on when it is appropriate to go to the emergency department.   Time call ended:  11:40  I provided 20 minutes of non-face-to-face time during this encounter.    Mary-Margaret Hassell Done, FNP

## 2020-03-15 ENCOUNTER — Encounter: Payer: Self-pay | Admitting: Nurse Practitioner

## 2020-03-15 ENCOUNTER — Other Ambulatory Visit: Payer: Self-pay

## 2020-03-15 ENCOUNTER — Ambulatory Visit (INDEPENDENT_AMBULATORY_CARE_PROVIDER_SITE_OTHER): Payer: BC Managed Care – PPO | Admitting: Nurse Practitioner

## 2020-03-15 VITALS — BP 137/90 | HR 50 | Temp 98.1°F | Resp 20 | Ht 75.0 in | Wt 246.0 lb

## 2020-03-15 DIAGNOSIS — Z0001 Encounter for general adult medical examination with abnormal findings: Secondary | ICD-10-CM | POA: Diagnosis not present

## 2020-03-15 DIAGNOSIS — Z0000 Encounter for general adult medical examination without abnormal findings: Secondary | ICD-10-CM

## 2020-03-15 DIAGNOSIS — W57XXXA Bitten or stung by nonvenomous insect and other nonvenomous arthropods, initial encounter: Secondary | ICD-10-CM | POA: Diagnosis not present

## 2020-03-15 DIAGNOSIS — Z125 Encounter for screening for malignant neoplasm of prostate: Secondary | ICD-10-CM | POA: Diagnosis not present

## 2020-03-15 DIAGNOSIS — E785 Hyperlipidemia, unspecified: Secondary | ICD-10-CM

## 2020-03-15 DIAGNOSIS — R413 Other amnesia: Secondary | ICD-10-CM

## 2020-03-15 DIAGNOSIS — I10 Essential (primary) hypertension: Secondary | ICD-10-CM | POA: Diagnosis not present

## 2020-03-15 DIAGNOSIS — Z6830 Body mass index (BMI) 30.0-30.9, adult: Secondary | ICD-10-CM | POA: Diagnosis not present

## 2020-03-15 NOTE — Progress Notes (Deleted)
Established Patient Office Visit  Subjective:  Patient ID: Richard Moore, male    DOB: [birth year]  Age: 59 y.o. MRN: [id number]  CC:  Chief Complaint  Patient presents with  . Annual Exam    HPI [place] presents for ***  Past Medical History:  Diagnosis Date  . COPD (chronic obstructive pulmonary disease) ([place])   . GERD (gastroesophageal reflux disease)   . Hyperlipidemia   . Hypertension   . Pneumonia   . Sleep apnea    doesn't wear CPAP- uses mouth piece now    Past Surgical History:  Procedure Laterality Date  . COLONOSCOPY    . INGUINAL HERNIA REPAIR     right  . VASECTOMY      Family History  Problem Relation Age of Onset  . Lung cancer Father        23 years ago  . Colon cancer Neg Hx   . Esophageal cancer Neg Hx   . Rectal cancer Neg Hx   . Stomach cancer Neg Hx     Social History   Socioeconomic History  . Marital status: Married    Spouse name: Not on file  . Number of children: Not on file  . Years of education: Not on file  . Highest education level: Not on file  Occupational History  . Occupation: put in Event organiser: [employer]  Tobacco Use  . Smoking status: Former Smoker    Packs/day: 2.00    Years: 15.00    Pack years: 30.00    Types: Cigarettes    Quit date: 09/15/2007    Years since quitting: 12.5  . Smokeless tobacco: Never Used  Vaping Use  . Vaping Use: Never used  Substance and Sexual Activity  . Alcohol use: Yes    Alcohol/week: 6.0 standard drinks    Types: 6 Standard drinks or equivalent per week    Comment: occasionally  . Drug use: No  . Sexual activity: Not on file  Other Topics Concern  . Not on file  Social History Narrative  . Not on file   Social Determinants of Health   Financial Resource Strain:   . Difficulty of Paying Living Expenses:   Food Insecurity:   . Worried About Charity fundraiser in the Last Year:   . Arboriculturist in the Last Year:   Transportation  Needs:   . Film/video editor (Medical):   Marland Kitchen Lack of Transportation (Non-Medical):   Physical Activity:   . Days of Exercise per Week:   . Minutes of Exercise per Session:   Stress:   . Feeling of Stress :   Social Connections:   . Frequency of Communication with Friends and Family:   . Frequency of Social Gatherings with Friends and Family:   . Attends Religious Services:   . Active Member of Clubs or Organizations:   . Attends Archivist Meetings:   Marland Kitchen Marital Status:   Intimate Partner Violence:   . Fear of Current or Ex-Partner:   . Emotionally Abused:   Marland Kitchen Physically Abused:   . Sexually Abused:     Outpatient Medications Prior to Visit  Medication Sig Dispense Refill  . atorvastatin (LIPITOR) 40 MG tablet Take 1 tablet (40 mg total) by mouth daily. 90 tablet 1  . donepezil (ARICEPT) 10 MG tablet Take 1 tablet (10 mg total) by mouth at bedtime. 90 tablet 1  . ezetimibe (ZETIA) 10 MG tablet  Take 1 tablet (10 mg total) by mouth daily. 90 tablet 1  . lisinopril-hydrochlorothiazide (ZESTORETIC) 10-12.5 MG tablet Take 1 tablet by mouth daily. 90 tablet 1  . Naproxen Sodium (ALEVE PO) Take by mouth as needed.     Facility-Administered Medications Prior to Visit  Medication Dose Route Frequency Provider Last Rate Last Admin  . 0.9 %  sodium chloride infusion  500 mL Intravenous Continuous Milus Banister, MD        No Known Allergies  ROS Review of Systems    Objective:    Physical Exam  There were no vitals taken for this visit. Wt Readings from Last 3 Encounters:  11/10/19 258 lb (117 kg)  10/27/19 258 lb 12.8 oz (117.4 kg)  11/24/18 252 lb (114.3 kg)     Health Maintenance Due  Topic Date Due  . COVID-19 Vaccine (1) Never done  . HIV Screening  Never done  . TETANUS/TDAP  Never done    There are no preventive care reminders to display for this patient.  No results found for: TSH Lab Results  Component Value Date   WBC 6.6 11/05/2017    HGB 16.1 11/05/2017   HCT 47.0 11/05/2017   MCV 90 11/05/2017   PLT 159 11/05/2017   Lab Results  Component Value Date   NA 141 11/24/2018   K 4.1 11/24/2018   CO2 24 11/24/2018   GLUCOSE 93 11/24/2018   BUN 13 11/24/2018   CREATININE 0.97 11/24/2018   BILITOT 0.6 11/24/2018   ALKPHOS 37 (L) 11/24/2018   AST 39 11/24/2018   ALT 72 (H) 11/24/2018   PROT 6.7 11/24/2018   ALBUMIN 4.7 11/24/2018   CALCIUM 9.6 11/24/2018   Lab Results  Component Value Date   CHOL 190 11/24/2018   Lab Results  Component Value Date   HDL 42 11/24/2018   Lab Results  Component Value Date   LDLCALC 102 (H) 11/24/2018   Lab Results  Component Value Date   TRIG 229 (H) 11/24/2018   Lab Results  Component Value Date   CHOLHDL 4.5 11/24/2018   No results found for: HGBA1C    Assessment & Plan:   Problem List Items Addressed This Visit      Cardiovascular and Mediastinum   Essential hypertension - Primary     Other   BMI 30.0-30.9,adult   Hyperlipidemia with target LDL less than 100   Memory disturbance      No orders of the defined types were placed in this encounter.   Follow-up: No follow-ups on file.    Tresa Garter, RN

## 2020-03-15 NOTE — Progress Notes (Signed)
Subjective:    Patient ID: Richard Moore, male    DOB: 02/26/1961, 59 y.o.   MRN: [id number]   Chief Complaint: Annual Exam    HPI:  1. Essential hypertension Does not measure blood pressure at home, does not watch salt intake.   BP Readings from Last 3 Encounters:  11/10/19 125/72  11/24/18 135/81  12/16/17 130/88     2. BMI 30.0-30.9,adult Patient does not weigh hisself regularly, but does sometimes. Does watch fat intake and does not exercise often. Reports being exhausted from work.  BMI Readings from Last 3 Encounters:  11/10/19 32.25 kg/m  10/27/19 32.35 kg/m  11/24/18 31.50 kg/m   Wt Readings from Last 3 Encounters:  11/10/19 258 lb (117 kg)  10/27/19 258 lb 12.8 oz (117.4 kg)  11/24/18 252 lb (114.3 kg)      3. Hyperlipidemia with target LDL less than 100 Patient does not exercise because of exhaustion from work and does watch fat intake.  Lab Results  Component Value Date   CHOL 190 11/24/2018   HDL 42 11/24/2018   LDLCALC 102 (H) 11/24/2018   TRIG 229 (H) 11/24/2018   CHOLHDL 4.5 11/24/2018     4. Memory disturbance Reports memory disturbances are improving. He says he feels he is stable on aricept    Outpatient Encounter Medications as of 03/15/2020  Medication Sig  . atorvastatin (LIPITOR) 40 MG tablet Take 1 tablet (40 mg total) by mouth daily.  Marland Kitchen donepezil (ARICEPT) 10 MG tablet Take 1 tablet (10 mg total) by mouth at bedtime.  Marland Kitchen ezetimibe (ZETIA) 10 MG tablet Take 1 tablet (10 mg total) by mouth daily.  Marland Kitchen lisinopril-hydrochlorothiazide (ZESTORETIC) 10-12.5 MG tablet Take 1 tablet by mouth daily.  . Naproxen Sodium (ALEVE PO) Take by mouth as needed.     Past Surgical History:  Procedure Laterality Date  . COLONOSCOPY    . INGUINAL HERNIA REPAIR     right  . VASECTOMY      Family History  Problem Relation Age of Onset  . Lung cancer Father        23 years ago  . Colon cancer Neg Hx   . Esophageal cancer Neg Hx   .  Rectal cancer Neg Hx   . Stomach cancer Neg Hx     New complaints: Reports 2 tick bites that have not gotten better within 3 months. Reports excess fatigue.   Social history: Patient lives at home with his wife and 2 kids. Reports working 4-days a week, 10-hours a day.   Controlled substance contract: n/a     Review of Systems  Constitutional: Negative.   HENT: Negative.   Eyes: Negative.   Respiratory: Negative.   Cardiovascular: Negative.   Gastrointestinal: Negative.   Endocrine: Negative.   Genitourinary: Negative.   Musculoskeletal: Negative.   Skin: Negative.   Allergic/Immunologic: Negative.   Neurological: Negative.   Hematological: Negative.   Psychiatric/Behavioral: Negative.        Objective:   Physical Exam Vitals and nursing note reviewed.  Constitutional:      Appearance: Normal appearance. He is normal weight.  HENT:     Head: Normocephalic and atraumatic.     Right Ear: Tympanic membrane, ear canal and external ear normal.     Left Ear: Tympanic membrane, ear canal and external ear normal.     Nose: Nose normal.     Mouth/Throat:     Mouth: Mucous membranes are moist.     Pharynx:  Oropharynx is clear.  Eyes:     Extraocular Movements: Extraocular movements intact.     Conjunctiva/sclera: Conjunctivae normal.     Pupils: Pupils are equal, round, and reactive to light.  Cardiovascular:     Rate and Rhythm: Normal rate and regular rhythm.     Pulses: Normal pulses.     Heart sounds: Normal heart sounds.  Pulmonary:     Effort: Pulmonary effort is normal.     Breath sounds: Normal breath sounds.  Abdominal:     General: Abdomen is flat. Bowel sounds are normal.     Palpations: Abdomen is soft.  Genitourinary:    Penis: Normal.      Testes: Normal.     Prostate: Normal.     Rectum: Normal.  Musculoskeletal:        General: Normal range of motion.     Cervical back: Normal range of motion and neck supple.  Skin:    General: Skin is warm  and dry.     Capillary Refill: Capillary refill takes less than 2 seconds.  Neurological:     General: No focal deficit present.     Mental Status: He is alert and oriented to person, place, and time. Mental status is at baseline.  Psychiatric:        Mood and Affect: Mood normal.        Behavior: Behavior normal.        Thought Content: Thought content normal.        Judgment: Judgment normal.      Today's Vitals   03/15/20 0920  BP: 137/90  Pulse: (!) 50  Resp: 20  Temp: 98.1 F (36.7 C)  TempSrc: Temporal  SpO2: 99%  Weight: 246 lb (111.6 kg)  Height: _0  (1.905 m)   Body mass index is 30.75 kg/m.      Assessment & Plan:  [name] comes in today with chief complaint of Annual Exam   Diagnosis and orders addressed:  1. Essential hypertension Patient instructed to check BP regularly at home and comply with a sodium-restricted diet. Instructed to report signs of headache or blurry vision.   2. BMI 30.0-30.9,adult Patient instructed to weigh regularly and watch fat intake. Exercise as tolerated.   3. Hyperlipidemia with target LDL less than 100 Patient encouraged to exercise as tolerated and comply with a fat-restricted diet.   4. Memory disturbance Patient instructed to maintain a regulated routine and make notes of important tasks. Encouraged to involve family with care plan for support and observation.   Orders Placed This Encounter  Procedures  . CBC with Differential/Platelet  . CMP14+EGFR  . Lipid panel  . PSA, total and free     Labs pending Health Maintenance reviewed Diet and exercise encouraged  Follow up plan: Follow-up in 6 months  Alean Rinne, BSN, RN  [place], FNP

## 2020-03-19 LAB — CMP14+EGFR
ALT: 54 IU/L — ABNORMAL HIGH (ref 0–44)
AST: 30 IU/L (ref 0–40)
Albumin/Globulin Ratio: 2.3 — ABNORMAL HIGH (ref 1.2–2.2)
Albumin: 4.6 g/dL (ref 3.8–4.9)
Alkaline Phosphatase: 37 IU/L — ABNORMAL LOW (ref 48–121)
BUN/Creatinine Ratio: 13 (ref 9–20)
BUN: 12 mg/dL (ref 6–24)
Bilirubin Total: 0.9 mg/dL (ref 0.0–1.2)
CO2: 22 mmol/L (ref 20–29)
Calcium: 9.3 mg/dL (ref 8.7–10.2)
Chloride: 102 mmol/L (ref 96–106)
Creatinine, Ser: 0.92 mg/dL (ref 0.76–1.27)
GFR calc Af Amer: 106 mL/min/{1.73_m2} (ref >59)
GFR calc non Af Amer: 92 mL/min/{1.73_m2} (ref >59)
Globulin, Total: 2 g/dL (ref 1.5–4.5)
Glucose: 98 mg/dL (ref 65–99)
Potassium: 4.2 mmol/L (ref 3.5–5.2)
Sodium: 139 mmol/L (ref 134–144)
Total Protein: 6.6 g/dL (ref 6.0–8.5)

## 2020-03-19 LAB — CBC WITH DIFFERENTIAL/PLATELET
Basophils Absolute: 0 10*3/uL (ref 0.0–0.2)
Basos: 1 %
EOS (ABSOLUTE): 0.1 10*3/uL (ref 0.0–0.4)
Eos: 2 %
Hematocrit: 45.5 % (ref 37.5–51.0)
Hemoglobin: 15.8 g/dL (ref 13.0–17.7)
Immature Grans (Abs): 0 10*3/uL (ref 0.0–0.1)
Immature Granulocytes: 0 %
Lymphocytes Absolute: 1.8 10*3/uL (ref 0.7–3.1)
Lymphs: 39 %
MCH: 31.3 pg (ref 26.6–33.0)
MCHC: 34.7 g/dL (ref 31.5–35.7)
MCV: 90 fL (ref 79–97)
Monocytes Absolute: 0.6 10*3/uL (ref 0.1–0.9)
Monocytes: 12 %
Neutrophils Absolute: 2.2 10*3/uL (ref 1.4–7.0)
Neutrophils: 46 %
Platelets: 151 10*3/uL (ref 150–450)
RBC: 5.04 x10E6/uL (ref 4.14–5.80)
RDW: 13.2 % (ref 11.6–15.4)
WBC: 4.7 10*3/uL (ref 3.4–10.8)

## 2020-03-19 LAB — PSA, TOTAL AND FREE
PSA, Free Pct: 32 %
PSA, Free: 0.16 ng/mL
Prostate Specific Ag, Serum: 0.5 ng/mL (ref 0.0–4.0)

## 2020-03-19 LAB — ROCKY MTN SPOTTED FVR ABS PNL(IGG+IGM)
RMSF IgG: UNDETERMINED
RMSF IgM: 0.15 index (ref 0.00–0.89)

## 2020-03-19 LAB — LYME AB/WESTERN BLOT REFLEX
LYME DISEASE AB, QUANT, IGM: <0.8 index (ref 0.00–0.79)
Lyme IgG/IgM Ab: <0.91 {ISR} (ref 0.00–0.90)

## 2020-03-19 LAB — LIPID PANEL
Chol/HDL Ratio: 2.8 ratio (ref 0.0–5.0)
Cholesterol, Total: 113 mg/dL (ref 100–199)
HDL: 40 mg/dL (ref >39)
LDL Chol Calc (NIH): 56 mg/dL (ref 0–99)
Triglycerides: 87 mg/dL (ref 0–149)
VLDL Cholesterol Cal: 17 mg/dL (ref 5–40)

## 2020-03-19 LAB — RMSF, IGG, IFA: RMSF, IGG, IFA: <1:64 {titer}

## 2020-07-31 ENCOUNTER — Other Ambulatory Visit: Payer: Self-pay | Admitting: Nurse Practitioner

## 2020-07-31 DIAGNOSIS — R413 Other amnesia: Secondary | ICD-10-CM

## 2020-07-31 DIAGNOSIS — I10 Essential (primary) hypertension: Secondary | ICD-10-CM

## 2020-07-31 DIAGNOSIS — E785 Hyperlipidemia, unspecified: Secondary | ICD-10-CM

## 2020-09-14 DIAGNOSIS — J209 Acute bronchitis, unspecified: Secondary | ICD-10-CM | POA: Diagnosis not present

## 2020-09-14 DIAGNOSIS — R051 Acute cough: Secondary | ICD-10-CM | POA: Diagnosis not present

## 2020-11-07 ENCOUNTER — Encounter: Payer: Self-pay | Admitting: Internal Medicine

## 2020-11-07 ENCOUNTER — Ambulatory Visit (INDEPENDENT_AMBULATORY_CARE_PROVIDER_SITE_OTHER): Payer: BC Managed Care – PPO | Admitting: Internal Medicine

## 2020-11-07 ENCOUNTER — Other Ambulatory Visit: Payer: Self-pay

## 2020-11-07 VITALS — BP 124/84 | HR 73 | Temp 97.3°F | Ht 75.0 in | Wt 258.2 lb

## 2020-11-07 DIAGNOSIS — R9389 Abnormal findings on diagnostic imaging of other specified body structures: Secondary | ICD-10-CM | POA: Diagnosis not present

## 2020-11-07 DIAGNOSIS — R911 Solitary pulmonary nodule: Secondary | ICD-10-CM | POA: Diagnosis not present

## 2020-11-07 DIAGNOSIS — Z87891 Personal history of nicotine dependence: Secondary | ICD-10-CM

## 2020-11-07 DIAGNOSIS — R0609 Other forms of dyspnea: Secondary | ICD-10-CM

## 2020-11-07 DIAGNOSIS — R053 Chronic cough: Secondary | ICD-10-CM

## 2020-11-07 DIAGNOSIS — R0600 Dyspnea, unspecified: Secondary | ICD-10-CM | POA: Diagnosis not present

## 2020-11-07 LAB — HEPATIC FUNCTION PANEL
ALT: 54 U/L — ABNORMAL HIGH (ref 0–53)
AST: 31 U/L (ref 0–37)
Albumin: 4.3 g/dL (ref 3.5–5.2)
Alkaline Phosphatase: 33 U/L — ABNORMAL LOW (ref 39–117)
Bilirubin, Direct: 0.2 mg/dL (ref 0.0–0.3)
Total Bilirubin: 1 mg/dL (ref 0.2–1.2)
Total Protein: 6.7 g/dL (ref 6.0–8.3)

## 2020-11-07 LAB — CBC WITH DIFFERENTIAL/PLATELET
Basophils Absolute: 0 10*3/uL (ref 0.0–0.1)
Basophils Relative: 0.7 % (ref 0.0–3.0)
Eosinophils Absolute: 0.1 10*3/uL (ref 0.0–0.7)
Eosinophils Relative: 1.6 % (ref 0.0–5.0)
HCT: 45.8 % (ref 39.0–52.0)
Hemoglobin: 16.1 g/dL (ref 13.0–17.0)
Lymphocytes Relative: 36 % (ref 12.0–46.0)
Lymphs Abs: 1.5 10*3/uL (ref 0.7–4.0)
MCHC: 35.1 g/dL (ref 30.0–36.0)
MCV: 88.8 fl (ref 78.0–100.0)
Monocytes Absolute: 0.5 10*3/uL (ref 0.1–1.0)
Monocytes Relative: 11.5 % (ref 3.0–12.0)
Neutro Abs: 2.1 10*3/uL (ref 1.4–7.7)
Neutrophils Relative %: 50.2 % (ref 43.0–77.0)
Platelets: 128 10*3/uL — ABNORMAL LOW (ref 150.0–400.0)
RBC: 5.16 Mil/uL (ref 4.22–5.81)
RDW: 13.4 % (ref 11.5–15.5)
WBC: 4.3 10*3/uL (ref 4.0–10.5)

## 2020-11-07 LAB — COMPREHENSIVE METABOLIC PANEL
ALT: 54 U/L — ABNORMAL HIGH (ref 0–53)
AST: 31 U/L (ref 0–37)
Albumin: 4.3 g/dL (ref 3.5–5.2)
Alkaline Phosphatase: 33 U/L — ABNORMAL LOW (ref 39–117)
BUN: 9 mg/dL (ref 6–23)
CO2: 27 mEq/L (ref 19–32)
Calcium: 9.2 mg/dL (ref 8.4–10.5)
Chloride: 104 mEq/L (ref 96–112)
Creatinine, Ser: 0.8 mg/dL (ref 0.40–1.50)
GFR: 97.6 mL/min (ref >60.00)
Glucose, Bld: 110 mg/dL — ABNORMAL HIGH (ref 70–99)
Potassium: 4 mEq/L (ref 3.5–5.1)
Sodium: 137 mEq/L (ref 135–145)
Total Bilirubin: 1 mg/dL (ref 0.2–1.2)
Total Protein: 6.7 g/dL (ref 6.0–8.3)

## 2020-11-07 LAB — BRAIN NATRIURETIC PEPTIDE: Pro B Natriuretic peptide (BNP): 16 pg/mL (ref 0.0–100.0)

## 2020-11-07 LAB — TROPONIN I (HIGH SENSITIVITY): High Sens Troponin I: 4 ng/L (ref 2–17)

## 2020-11-07 LAB — D-DIMER, QUANTITATIVE: D-Dimer, Quant: 0.32 mcg/mL FEU (ref <0.50)

## 2020-11-07 MED ORDER — SPIRIVA RESPIMAT 1.25 MCG/ACT IN AERS: 2.0000 | INHALATION_SPRAY | Freq: Every day | RESPIRATORY_TRACT | 0 refills | Status: DC

## 2020-11-07 NOTE — Addendum Note (Signed)
Addended by: Suzzanne Cloud E on: 11/07/2020 10:31 AM   Modules accepted: Orders

## 2020-11-07 NOTE — Addendum Note (Signed)
Addended byCoralie Keens on: 11/07/2020 10:35 AM   Modules accepted: Orders

## 2020-11-07 NOTE — Progress Notes (Signed)
Subjective:     Patient ID: Richard Moore, male   DOB: [birth year], 60 y.o.   MRN: [id number]  PCP Chevis Pretty, FNP  HPI  IOV 12/16/2017  Chief Complaint  Patient presents with  . Consult    COPD per [facility]. Pt last saw MR 06/22/12.  Pt does have c/o cough sometimes with brown to yellow mucus.    60 year old male who says that in 2013 I took care of him for pneumonia.  Review of the chart and visualization shows that he had CT scan evidence of left lower lobe pneumonia in 2013.  After that he had complete recovery but he says clinically he did not follow-up with me.  Recently went and saw her primary care physician according to his history for left upper back pain along with lower back mid spinal area pain that is chronic.  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  As part of this evaluation for pain he had a chest x-ray that then they reported to him he has COPD [I visualized the chest x-ray from February 2019 and confirmed the presence of hyperinflation and right lower lobe atelectasis] therefore he has been referred here.  He says he that he does not have any symptoms but when I started questioning him again he started admitting to chronic cough for many years.  It happens early in the morning when he brushes the teeth.  His wife notices it.  It does not bother him.  Therefore it is mild to moderate in intensity.  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  Several years ago this was not the case.  There are no other symptoms.  Med review history shows associated lisinopril intake for many years.  CAT COPD Symptom & Quality of Life Score (GSK trademark) 0 is no burden. 5 is highest burden 12/16/2017   Never Cough -> Cough all the time 3  No phlegm in chest -> Chest is full of phlegm 2  No chest tightness ->  Chest feels very tight 0  No dyspnea for 1 flight stairs/hill -> Very dyspneic for 1 flight of stairs 1  No limitations for ADL at home -> Very limited with ADL at home 0  Confident leaving home -> Not at all confident leaving home 0  Sleep soundly -> Do not sleep soundly because of lung condition 5  Lots of Energy -> No energy at all 1  TOTAL Score (max 40)  12    Results for Richard, Moore (MRN [id number]) as of 12/16/2017 09:30  Ref. Range 11/05/2017 17:19  Creatinine Latest Ref Range: 0.76 - 1.27 mg/dL 0.94   Results for Richard Moore (MRN [id number]) as of 12/16/2017 09:30  Ref. Range 11/05/2017 17:19  Hemoglobin Latest Ref Range: 13.0 - 17.7 g/dL 16.1    CT Chest data April 2019  IMPRESSION: 1. Scattered areas of peribronchovascular and subpleural ground-glass, with somewhat of a mid and lower lung zone predominance. Findings are nonspecific and may be post infectious/postinflammatory in etiology when compared with 02/29/2012. Nonspecific interstitial pneumonitis is not excluded. 2. 4 mm subpleural right lower lobe nodule, likely benign. No follow-up needed if patient is low-risk. Non-contrast chest CT can be considered in 12 months if patient is high-risk. This recommendation follows the consensus statement: Guidelines for Management of Incidental  Pulmonary Nodules Detected on CT Images: From the Fleischner Society 2017; Radiology 2017; 284:228-243. 3.  Emphysema (ICD10-J43.9). 4. Aortic atherosclerosis (ICD10-170.0). Coronary artery calcification. 5. Tiny right renal stone.   Electronically Signed   By: Lorin Picket M.D.   On: 01/01/2018 08:47     OV 11/07/2020  Subjective:  Patient ID: Richard Moore, male , DOB: [birth year] , age 60 y.o. , MRN: [id number] , ADDRESS: [address] [place] [address] PCP Chevis Pretty, FNP Patient Care Team: Chevis Pretty, FNP as PCP - General (Nurse Practitioner)  This Provider for this visit:  Treatment Team:  Attending Provider: Brand Males, MD    11/07/2020 -   Chief Complaint  Patient presents with  . Follow-up    Recently had PNA in Jan, still winded easily     HPI Richard Moore 60 y.o. - returns for follow-up.  It is just shy of 3 years since I last saw him.  Therefore technically is an established patient.  He says after he last saw me for shortness of breath and cough [he is on lisinopril] he had CT scan of the chest that showed a 4 mm nodule but also emphysema and plus minus early ILD.  He says he was sent by [employer] to work in [place].  He was only coming to [place] on the weekends.  Therefore has been unable to keep up with follow-up.  He says he had his Covid vaccine [not documented in our records].  He has had 2 shots of this.  He says that over the last 3 years he has had insidious worsening of shortness of breath with exertion relieved by rest.  He also has cough.  There is no associated chest pain orthopnea proximal nocturnal dyspnea.  He could be snoring.  Symptoms suggest progressive.  He is desperate about the severity of symptoms.  Mid January 2022 he had bronchitis symptoms which he described.  He went to an urgent care and was told he had double pneumonia.  But he says he was never Covid tested.  He never had a chest x-ray but was given antibiotics for "double pneumonia".  He says this is set him back.   CXR Feb 2019  Narrative & Impression  CLINICAL DATA:  Chest pain and cough.  EXAM: CHEST  2 VIEW  COMPARISON:  Chest radiograph June 30, 2013  FINDINGS: Cardiomediastinal silhouette is normal. No pleural effusions or focal consolidations. Similar mild interstitial prominence. Strandy densities RIGHT lung base. Trachea projects midline and there is no pneumothorax. Soft tissue planes and included osseous structures are non-suspicious.  IMPRESSION: Mild suspected COPD with RIGHT lung base  atelectasis.   Electronically Signed   By: Elon Alas M.D.   On: 11/06/2017 01:43     No flowsheet data found.     has a past medical history of COPD (chronic obstructive pulmonary disease) ([place]), GERD (gastroesophageal reflux disease), Hyperlipidemia, Hypertension, Pneumonia, and Sleep apnea.   reports that he quit smoking about 13 years ago. His smoking use included cigarettes. He has a 30.00 pack-year smoking history. He has never used smokeless tobacco.  Past Surgical History:  Procedure Laterality Date  . COLONOSCOPY    . INGUINAL HERNIA REPAIR     right  . VASECTOMY      No Known Allergies   There is no immunization history on file for this patient.  Family History  Problem Relation Age of Onset  . Lung cancer Father  23 years ago  . Colon cancer Neg Hx   . Esophageal cancer Neg Hx   . Rectal cancer Neg Hx   . Stomach cancer Neg Hx      Current Outpatient Medications:  .  atorvastatin (LIPITOR) 40 MG tablet, TAKE 1 TABLET DAILY, Disp: 90 tablet, Rfl: 0 .  donepezil (ARICEPT) 10 MG tablet, TAKE 1 TABLET AT BEDTIME, Disp: 90 tablet, Rfl: 0 .  ezetimibe (ZETIA) 10 MG tablet, TAKE 1 TABLET DAILY, Disp: 90 tablet, Rfl: 0 .  lisinopril-hydrochlorothiazide (ZESTORETIC) 10-12.5 MG tablet, TAKE 1 TABLET DAILY, Disp: 90 tablet, Rfl: 0  Current Facility-Administered Medications:  .  0.9 %  sodium chloride infusion, 500 mL, Intravenous, Continuous, Milus Banister, MD      Objective:   Vitals:   11/07/20 0937  BP: 124/84  Pulse: 73  Temp: (!) 97.3 F (36.3 C)  TempSrc: Oral  SpO2: 97%  Weight: 258 lb 3.2 oz (117.1 kg)  Height: 6\' 3"  (1.905 m)    Estimated body mass index is 32.27 kg/m as calculated from the following:   Height as of this encounter: 6\' 3"  (1.905 m).   Weight as of this encounter: 258 lb 3.2 oz (117.1 kg).  @WEIGHTCHANGE @  [employer]   11/07/20 0937  Weight: 258 lb 3.2 oz (117.1 kg)     Physical Exam  General  Appearance:    Alert, cooperative, no distress, appears stated age - yes , Deconditioned looking - no , OBESE  - yes, Sitting on Wheelchair -  no  Head:    Normocephalic, without obvious abnormality, atraumatic  Eyes:    PERRL, conjunctiva/corneas clear,  Ears:    Normal TM's and external ear canals, both ears  Nose:   Nares normal, septum midline, mucosa normal, no drainage    or sinus tenderness. OXYGEN ON  - no . Patient is @ ra   Throat:   Lips, mucosa, and tongue normal; teeth and gums normal. Cyanosis on lips - no  Neck:   Supple, symmetrical, trachea midline, no adenopathy;    thyroid:  no enlargement/tenderness/nodules; no carotid   bruit or JVD  Back:     Symmetric, no curvature, ROM normal, no CVA tenderness  Lungs:     Distress - no , Wheeze no, Barrell Chest - no, Purse lip breathing - no, Crackles - no   Chest Wall:    No tenderness or deformity.    Heart:    Regular rate and rhythm, S1 and S2 normal, no rub   or gallop, Murmur - no  Breast Exam:    NOT DONE  Abdomen:     Soft, non-tender, bowel sounds active all four quadrants,    no masses, no organomegaly. Visceral obesity - yes  Genitalia:   NOT DONE  Rectal:   NOT DONE  Extremities:   Extremities - normal, Has Cane - no, Clubbing - no, Edema - no  Pulses:   2+ and symmetric all extremities  Skin:   Stigmata of Connective Tissue Disease - no  Lymph nodes:   Cervical, supraclavicular, and axillary nodes normal  Psychiatric:  Neurologic:   Pleasant - yes, Anxious - no, Flat affect - no  CAm-ICU - neg, Alert and Oriented x 3 - yes, Moves all 4s - yes, Speech - normal, Cognition - intact          Assessment:       ICD-10-CM   1. Dyspnea on exertion  R06.00   2. Chronic  cough  R05.3   3. Stopped smoking with greater than 30 pack year history  Z87.891   4. Abnormal CT of the chest  R93.89   5. Nodule of lower lobe of right lung  R91.1        Plan:     Patient Instructions  Dyspnea on exertion Chronic  cough Stopped smoking with greater than 30 pack year history Abnormal CT of the chest Nodule of lower lobe of right lung Hx of recent pneumonia   Plan  -Do CBC with differential, chemistry, liver function test, BNP, troponin, D-dimer -Do blood Covid IgG -Do high-resolution CT chest supine and prone -Do full pulmonary function test -Do echocardiogram - do ONO  Follow-up -Return in the next few to several weeks but after completing the test results: To see nurse practitioner  - do walk test at followup  -If there is anything surprising with the results and we will call ahead and change plan accordingly     SIGNATURE    Dr. Brand Males, M.D., F.C.C.P,  Pulmonary and Critical Care Medicine Staff Physician, [place] Director - Interstitial Lung Disease  Program  Pulmonary [place] [facility] [place], [place], [address]  Pager: [phone], If no answer or between  15:00h - 7:00h: call [phone] Telephone: [phone]  10:08 AM 11/07/2020

## 2020-11-07 NOTE — Addendum Note (Signed)
Addended by: Coralie Keens on: 11/07/2020 05:35 PM   Modules accepted: Orders

## 2020-11-07 NOTE — Addendum Note (Signed)
Addended byCoralie Keens on: 11/07/2020 10:13 AM   Modules accepted: Orders

## 2020-11-07 NOTE — Addendum Note (Signed)
Addended by: Suzzanne Cloud E on: 11/07/2020 10:19 AM   Modules accepted: Orders

## 2020-11-07 NOTE — Patient Instructions (Addendum)
Dyspnea on exertion Chronic cough Stopped smoking with greater than 30 pack year history Abnormal CT of the chest Nodule of lower lobe of right lung Hx of recent pneumonia   Plan  -Do CBC with differential, chemistry, liver function test, BNP, troponin, D-dimer -Do blood Covid IgG -Do high-resolution CT chest supine and prone -Do full pulmonary function test -Do echocardiogram - do ONO  Follow-up -Return in the next few to several weeks but after completing the test results: To see nurse practitioner  - do walk test at followup  -If there is anything surprising with the results and we will call ahead and change plan accordingly

## 2020-11-08 LAB — SARS-COV-2 ANTIBODY(IGG)SPIKE,SEMI-QUANTITATIVE: SARS COV1 AB(IGG)SPIKE,SEMI QN: >150 index — ABNORMAL HIGH (ref <1.00)

## 2020-11-09 ENCOUNTER — Telehealth: Payer: Self-pay | Admitting: Internal Medicine

## 2020-11-09 NOTE — Telephone Encounter (Signed)
Richard Moore  Let Vivia Birmingham know that blood woirk is all normal except covid IgG antibody is strongly positive. This suggests that what he had in Jan 2022 was Omicron covid. Glad he had prior vaccine because that probably and likely protected him from hospitalization  Thanks  MR

## 2020-11-11 NOTE — Telephone Encounter (Signed)
Called and spoke with pt letting him know the results of labwork and info stated by MR and he verbalized understanding. Nothing further needed.

## 2020-11-13 ENCOUNTER — Other Ambulatory Visit: Payer: Self-pay | Admitting: Nurse Practitioner

## 2020-11-13 DIAGNOSIS — I10 Essential (primary) hypertension: Secondary | ICD-10-CM

## 2020-11-13 DIAGNOSIS — E785 Hyperlipidemia, unspecified: Secondary | ICD-10-CM

## 2020-11-13 DIAGNOSIS — R413 Other amnesia: Secondary | ICD-10-CM

## 2020-11-25 DIAGNOSIS — Z1152 Encounter for screening for COVID-19: Secondary | ICD-10-CM | POA: Diagnosis not present

## 2020-11-25 DIAGNOSIS — Z03818 Encounter for observation for suspected exposure to other biological agents ruled out: Secondary | ICD-10-CM | POA: Diagnosis not present

## 2020-11-29 ENCOUNTER — Other Ambulatory Visit: Payer: Self-pay

## 2020-11-29 ENCOUNTER — Encounter: Payer: Self-pay | Admitting: Adult Health

## 2020-11-29 ENCOUNTER — Ambulatory Visit (INDEPENDENT_AMBULATORY_CARE_PROVIDER_SITE_OTHER): Payer: BC Managed Care – PPO | Admitting: Adult Health

## 2020-11-29 ENCOUNTER — Ambulatory Visit
Admission: RE | Admit: 2020-11-29 | Discharge: 2020-11-29 | Disposition: A | Discharge status: Home / Self Care | Payer: BC Managed Care – PPO | Source: Ambulatory Visit | Attending: Internal Medicine | Admitting: Internal Medicine

## 2020-11-29 ENCOUNTER — Ambulatory Visit (INDEPENDENT_AMBULATORY_CARE_PROVIDER_SITE_OTHER): Payer: BC Managed Care – PPO | Admitting: Internal Medicine

## 2020-11-29 DIAGNOSIS — R0600 Dyspnea, unspecified: Secondary | ICD-10-CM

## 2020-11-29 DIAGNOSIS — R0609 Other forms of dyspnea: Secondary | ICD-10-CM

## 2020-11-29 DIAGNOSIS — Z87891 Personal history of nicotine dependence: Secondary | ICD-10-CM

## 2020-11-29 DIAGNOSIS — R9389 Abnormal findings on diagnostic imaging of other specified body structures: Secondary | ICD-10-CM

## 2020-11-29 DIAGNOSIS — R053 Chronic cough: Secondary | ICD-10-CM

## 2020-11-29 DIAGNOSIS — R911 Solitary pulmonary nodule: Secondary | ICD-10-CM

## 2020-11-29 DIAGNOSIS — J439 Emphysema, unspecified: Secondary | ICD-10-CM | POA: Insufficient documentation

## 2020-11-29 LAB — PULMONARY FUNCTION TEST
DL/VA % pred: 105 %
DL/VA: 4.4 ml/min/mmHg/L
DLCO cor % pred: 81 %
DLCO cor: 27.35 ml/min/mmHg
DLCO unc % pred: 85 %
DLCO unc: 28.44 ml/min/mmHg
FEF 25-75 Post: 4.51 L/sec
FEF 25-75 Pre: 3.95 L/sec
FEF2575-%Change-Post: 14 %
FEF2575-%Pred-Post: 123 %
FEF2575-%Pred-Pre: 107 %
FEV1-%Change-Post: 4 %
FEV1-%Pred-Post: 83 %
FEV1-%Pred-Pre: 80 %
FEV1-Post: 3.75 L
FEV1-Pre: 3.6 L
FEV1FVC-%Change-Post: 1 %
FEV1FVC-%Pred-Pre: 107 %
FEV6-%Change-Post: 2 %
FEV6-%Pred-Post: 79 %
FEV6-%Pred-Pre: 77 %
FEV6-Post: 4.5 L
FEV6-Pre: 4.38 L
FEV6FVC-%Pred-Post: 104 %
FEV6FVC-%Pred-Pre: 104 %
FVC-%Change-Post: 2 %
FVC-%Pred-Post: 76 %
FVC-%Pred-Pre: 74 %
FVC-Post: 4.5 L
FVC-Pre: 4.38 L
Post FEV1/FVC ratio: 83 %
Post FEV6/FVC ratio: 100 %
Pre FEV1/FVC ratio: 82 %
Pre FEV6/FVC Ratio: 100 %
RV % pred: 70 %
RV: 1.79 L
TLC % pred: 75 %
TLC: 6.17 L

## 2020-11-29 MED ORDER — SPIRIVA RESPIMAT 2.5 MCG/ACT IN AERS: 2.0000 | INHALATION_SPRAY | Freq: Every day | RESPIRATORY_TRACT | 5 refills | Status: DC

## 2020-11-29 NOTE — Assessment & Plan Note (Signed)
PFTs show no significant airflow obstruction.  There is mild restriction noted.  Patient has emphysema noted on previous CT scan. I recommend patient begin Spiriva as recommended last visit. High-resolution CT chest and echo are pending. Also would recommend possible change of ACE inhibitor as he has a chronic cough   Plan  Patient Instructions  Begin Spiriva 2 puffs daily .  Activity as tolerated Once CT and Echo are resulted,results will be called.  Discussed with primary care provider that Zestoretic (lisinopril) could be making your cough worse, may need an alternative if able Follow-up with Dr. Chase Caller in 6 weeks and as needed Please contact office for sooner follow up if symptoms do not improve or worsen or seek emergency care

## 2020-11-29 NOTE — Progress Notes (Signed)
Full PFT performed today. °

## 2020-11-29 NOTE — Patient Instructions (Addendum)
Begin Spiriva 2 puffs daily .  Activity as tolerated Once CT and Echo are resulted,results will be called.  Discussed with primary care provider that Zestoretic (lisinopril) could be making your cough worse, may need an alternative if able Follow-up with Dr. Chase Caller in 6 weeks and as needed Please contact office for sooner follow up if symptoms do not improve or worsen or seek emergency care

## 2020-11-29 NOTE — Progress Notes (Signed)
@Patient  ID: Richard Moore, male    DOB: 07/13/1961, 60 y.o.   MRN: [id number]  Chief Complaint  Patient presents with   Follow-up    Referring provider: Chevis Pretty, *  HPI: 60 year old male former smoker followed for COPD, chronic cough, abnormal CT chest and right lower lung nodule  TEST/EVENTS :  High-resolution CT chest April 2019 showed scattered areas of subpleural groundglass with a mid to lower lung zone predominance, findings are considered nonspecific and may be representative of a postinfectious/postinflammatory etiology.  4 mm right lower lobe nodule.  And emphysema.   11/29/2020 Follow up ; COPD, chronic cough, abnormal CT chest and right lung nodule Patient returns for a 1 month follow-up.  Patient was seen last visit for a follow-up with complaints of shortness of breath and cough lingering for 1 month. Sick in January 2022, dx with Pneumonia . Treated with antibiotics and steroids . Got better but cough and dyspnea persisted. Patient was set up for multiple tests and lab work.  Last visit labs showed a negative D-dimer, BNP.  Mildly elevated LFT with ALT at 54.  Normal CBC with eosinophils at absolute count 100.  Platelets were slightly low at 128,000. High-resolution CT chest and 2D echo are pending. Covid IGG was very high indicative of recent infection.  He was started on Spiriva last visit but did not get this at the pharmacy.  We called the pharmacy but prescription was not sent .  Since last visit patient says he is feeling about the same.  Gets short of breath with activities.  Has a daily chronic cough.  Patient is fully vaccinated for COVID-19.  Of note patient is on a ACE inhibitor. He denies any fever or discolored mucus chest pain orthopnea PND or increased leg swelling. PFTs today show mild restriction with an FEV1 at 83%, ratio 83, FVC 76%.  No significant bronchodilator response, DLCO 85%.  No Known Allergies   There is no immunization  history on file for this patient.  Past Medical History:  Diagnosis Date   COPD (chronic obstructive pulmonary disease) (HCC)    GERD (gastroesophageal reflux disease)    Hyperlipidemia    Hypertension    Pneumonia    Sleep apnea    doesn't wear CPAP- uses mouth piece now    Tobacco History: Social History   Tobacco Use  Smoking Status Former Smoker   Packs/day: 2.00   Years: 15.00   Pack years: 30.00   Types: Cigarettes   Quit date: 09/15/2007   Years since quitting: 13.2  Smokeless Tobacco Never Used   Counseling given: Not Answered   Outpatient Medications Prior to Visit  Medication Sig Dispense Refill   atorvastatin (LIPITOR) 40 MG tablet TAKE 1 TABLET DAILY 90 tablet 0   donepezil (ARICEPT) 10 MG tablet TAKE 1 TABLET AT BEDTIME 90 tablet 0   ezetimibe (ZETIA) 10 MG tablet TAKE 1 TABLET DAILY 90 tablet 0   lisinopril-hydrochlorothiazide (ZESTORETIC) 10-12.5 MG tablet TAKE 1 TABLET DAILY 90 tablet 0   Tiotropium Bromide Monohydrate (SPIRIVA RESPIMAT) 1.25 MCG/ACT AERS Inhale 2 puffs into the lungs daily. 4 each 0   Facility-Administered Medications Prior to Visit  Medication Dose Route Frequency Provider Last Rate Last Admin   0.9 %  sodium chloride infusion  500 mL Intravenous Continuous Milus Banister, MD         Review of Systems:   Constitutional:   No  weight loss, night sweats,  Fevers, chills,  +fatigue,  or  lassitude.  HEENT:   No headaches,  Difficulty swallowing,  Tooth/dental problems, or  Sore throat,                No sneezing, itching, ear ache, nasal congestion, post nasal drip,   CV:  No chest pain,  Orthopnea, PND, swelling in lower extremities, anasarca, dizziness, palpitations, syncope.   GI  No heartburn, indigestion, abdominal pain, nausea, vomiting, diarrhea, change in bowel habits, loss of appetite, bloody stools.   Resp:   No chest wall deformity  Skin: no rash or lesions.  GU: no dysuria, change in color of  urine, no urgency or frequency.  No flank pain, no hematuria   MS:  No joint pain or swelling.  No decreased range of motion.  No back pain.    Physical Exam  BP 132/86 (BP Location: Left Arm, Cuff Size: Normal)    Pulse 70    Temp (!) 97 F (36.1 C)    Ht 6\' 3"  (1.905 m)    Wt 257 lb (116.6 kg)    SpO2 97%    BMI 32.12 kg/m   GEN: A/Ox3; pleasant , NAD, well nourished    HEENT:  [place]/AT,   NOSE-clear, THROAT-clear, no lesions, no postnasal drip or exudate noted.   NECK:  Supple w/ fair ROM; no JVD; normal carotid impulses w/o bruits; no thyromegaly or nodules palpated; no lymphadenopathy.    RESP  Clear  P & A; w/o, wheezes/ rales/ or rhonchi. no accessory muscle use, no dullness to percussion  CARD:  RRR, no m/r/g, no peripheral edema, pulses intact, no cyanosis or clubbing.  GI:   Soft & nt; nml bowel sounds; no organomegaly or masses detected.   Musco: Warm bil, no deformities or joint swelling noted.   Neuro: alert, no focal deficits noted.    Skin: Warm, no lesions or rashes    Lab Results:  CBC    Component Value Date/Time   WBC 4.3 11/07/2020 1020   RBC 5.16 11/07/2020 1020   HGB 16.1 11/07/2020 1020   HGB 15.8 03/15/2020 0946   HCT 45.8 11/07/2020 1020   HCT 45.5 03/15/2020 0946   PLT 128.0 (L) 11/07/2020 1020   PLT 151 03/15/2020 0946   MCV 88.8 11/07/2020 1020   MCV 90 03/15/2020 0946   MCH 31.3 03/15/2020 0946   MCH 30.4 01/25/2012 2333   MCHC 35.1 11/07/2020 1020   RDW 13.4 11/07/2020 1020   RDW 13.2 03/15/2020 0946   LYMPHSABS 1.5 11/07/2020 1020   LYMPHSABS 1.8 03/15/2020 0946   MONOABS 0.5 11/07/2020 1020   EOSABS 0.1 11/07/2020 1020   EOSABS 0.1 03/15/2020 0946   BASOSABS 0.0 11/07/2020 1020   BASOSABS 0.0 03/15/2020 0946    BMET    Component Value Date/Time   NA 137 11/07/2020 1020   NA 139 03/15/2020 0946   K 4.0 11/07/2020 1020   CL 104 11/07/2020 1020   CO2 27 11/07/2020 1020   GLUCOSE 110 (H) 11/07/2020 1020   BUN 9 11/07/2020  1020   BUN 12 03/15/2020 0946   CREATININE 0.80 11/07/2020 1020   CALCIUM 9.2 11/07/2020 1020   GFRNONAA 92 03/15/2020 0946   GFRAA 106 03/15/2020 0946    BNP No results found for: BNP  ProBNP    Component Value Date/Time   PROBNP 16.0 11/07/2020 1020    Imaging: CT Chest High Resolution  Result Date: 11/29/2020 CLINICAL DATA:  60 year old male with history of shortness of breath. Chronic  cough. EXAM: CT CHEST WITHOUT CONTRAST TECHNIQUE: Multidetector CT imaging of the chest was performed following the standard protocol without intravenous contrast. High resolution imaging of the lungs, as well as inspiratory and expiratory imaging, was performed. COMPARISON:  High-resolution chest CT 12/31/2017. FINDINGS: Cardiovascular: Heart size is normal. There is no significant pericardial fluid, thickening or pericardial calcification. There is aortic atherosclerosis, as well as atherosclerosis of the great vessels of the mediastinum and the coronary arteries, including calcified atherosclerotic plaque in the left anterior descending and left circumflex coronary arteries. Mediastinum/Nodes: No pathologically enlarged mediastinal or hilar lymph nodes. Please note that accurate exclusion of hilar adenopathy is limited on noncontrast CT scans. Esophagus is unremarkable in appearance. No axillary lymphadenopathy. Lungs/Pleura: High-resolution images again demonstrate some patchy areas of peripheral predominant ground-glass attenuation, septal thickening, mild cylindrical bronchiectasis and peripheral bronchiolectasis. These findings have no definitive craniocaudal gradient. No definitive honeycombing confidently identified. Inspiratory and expiratory imaging demonstrates some mild air trapping indicative of mild small airways disease. Mild centrilobular and paraseptal emphysema again noted. No acute consolidative airspace disease. No pleural effusions. No suspicious appearing pulmonary nodules or masses are  noted. Small calcified granuloma in the periphery of the right upper lobe. Upper Abdomen: Diffuse low attenuation throughout the visualized hepatic parenchyma, indicative of hepatic steatosis. Musculoskeletal: There are no aggressive appearing lytic or blastic lesions noted in the visualized portions of the skeleton. IMPRESSION: 1. The appearance of the lungs is compatible with interstitial lung disease, with a spectrum of findings considered most compatible with an alternative diagnosis (not usual interstitial pneumonia) per current ATS guidelines. Overall, findings have been stable compared to prior study from 2019 and are favored to reflect mild nonspecific interstitial pneumonia (NSIP) in conjunction with a background of mild centrilobular and paraseptal emphysema. 2. Aortic atherosclerosis, in addition to 2 vessel coronary artery disease. Please note that although the presence of coronary artery calcium documents the presence of coronary artery disease, the severity of this disease and any potential stenosis cannot be assessed on this non-gated CT examination. Assessment for potential risk factor modification, dietary therapy or pharmacologic therapy may be warranted, if clinically indicated. 3. Hepatic steatosis. Aortic Atherosclerosis (ICD10-I70.0) and Emphysema (ICD10-J43.9). Electronically Signed   By: Vinnie Langton M.D.   On: 11/29/2020 12:05      PFT Results Latest Ref Rng & Units 11/29/2020  FVC-Pre L 4.38  FVC-Predicted Pre % 74  FVC-Post L 4.50  FVC-Predicted Post % 76  Pre FEV1/FVC % % 82  Post FEV1/FCV % % 83  FEV1-Pre L 3.60  FEV1-Predicted Pre % 80  FEV1-Post L 3.75  DLCO uncorrected ml/min/mmHg 28.44  DLCO UNC% % 85  DLCO corrected ml/min/mmHg 27.35  DLCO COR %Predicted % 81  DLVA Predicted % 105  TLC L 6.17  TLC % Predicted % 75  RV % Predicted % 70    No results found for: NITRICOXIDE      Assessment & Plan:   Emphysema/COPD (HCC) PFTs show no significant  airflow obstruction.  There is mild restriction noted.  Patient has emphysema noted on previous CT scan. I recommend patient begin Spiriva as recommended last visit. High-resolution CT chest and echo are pending. Also would recommend possible change of ACE inhibitor as he has a chronic cough   Plan  Patient Instructions  Begin Spiriva 2 puffs daily .  Activity as tolerated Once CT and Echo are resulted,results will be called.  Discussed with primary care provider that Zestoretic (lisinopril) could be making your cough worse,  may need an alternative if able Follow-up with Dr. Chase Caller in 6 weeks and as needed Please contact office for sooner follow up if symptoms do not improve or worsen or seek emergency care           Rexene Edison, NP 11/29/2020

## 2020-11-29 NOTE — Patient Instructions (Signed)
Pulmonary function test performed today. 

## 2020-12-06 ENCOUNTER — Other Ambulatory Visit: Payer: Self-pay

## 2020-12-06 ENCOUNTER — Ambulatory Visit (HOSPITAL_COMMUNITY): Payer: BC Managed Care – PPO | Attending: Cardiology

## 2020-12-06 DIAGNOSIS — R0600 Dyspnea, unspecified: Secondary | ICD-10-CM | POA: Insufficient documentation

## 2020-12-06 DIAGNOSIS — Z87891 Personal history of nicotine dependence: Secondary | ICD-10-CM | POA: Diagnosis not present

## 2020-12-06 DIAGNOSIS — R053 Chronic cough: Secondary | ICD-10-CM

## 2020-12-06 DIAGNOSIS — R911 Solitary pulmonary nodule: Secondary | ICD-10-CM

## 2020-12-06 DIAGNOSIS — R9389 Abnormal findings on diagnostic imaging of other specified body structures: Secondary | ICD-10-CM | POA: Insufficient documentation

## 2020-12-06 DIAGNOSIS — R0609 Other forms of dyspnea: Secondary | ICD-10-CM

## 2020-12-06 LAB — ECHOCARDIOGRAM COMPLETE
Area-P 1/2: 2.79 cm2
S' Lateral: 2.8 cm

## 2020-12-08 ENCOUNTER — Telehealth: Payer: Self-pay | Admitting: Internal Medicine

## 2020-12-08 DIAGNOSIS — J849 Interstitial pulmonary disease, unspecified: Secondary | ICD-10-CM

## 2020-12-08 NOTE — Progress Notes (Signed)
Normal echo. Not calling with normal results

## 2020-12-08 NOTE — Telephone Encounter (Signed)
Echo normal  CT shows he has ILD and unchanged from 2019  - baed on PFT is is mild. SO overalll non progressive mild ILD   Plan  -stilll needs workup for ILD - he should do ANA, RF, CCP  -he should do  ILD questionnaire  - ensure visit in May 2022 ( will be opening up schedule soon)  THanks  MR

## 2020-12-11 NOTE — Telephone Encounter (Signed)
Attempted to call pt but unable to reach. Left message for him to return call. °

## 2020-12-12 NOTE — Telephone Encounter (Signed)
Called and spoke with patient. He has been scheduled for 02/07/21 at 9am. He will complete his labwork this Friday at his PCP's office.   Nothing further needed at time of call.

## 2020-12-13 ENCOUNTER — Encounter: Payer: Self-pay | Admitting: Nurse Practitioner

## 2020-12-13 ENCOUNTER — Ambulatory Visit (INDEPENDENT_AMBULATORY_CARE_PROVIDER_SITE_OTHER): Payer: BC Managed Care – PPO | Admitting: Nurse Practitioner

## 2020-12-13 ENCOUNTER — Other Ambulatory Visit: Payer: Self-pay

## 2020-12-13 VITALS — BP 126/79 | HR 60 | Temp 97.4°F | Resp 20 | Ht 75.0 in | Wt 255.0 lb

## 2020-12-13 DIAGNOSIS — J439 Emphysema, unspecified: Secondary | ICD-10-CM

## 2020-12-13 DIAGNOSIS — Z125 Encounter for screening for malignant neoplasm of prostate: Secondary | ICD-10-CM | POA: Diagnosis not present

## 2020-12-13 DIAGNOSIS — R413 Other amnesia: Secondary | ICD-10-CM | POA: Diagnosis not present

## 2020-12-13 DIAGNOSIS — F419 Anxiety disorder, unspecified: Secondary | ICD-10-CM

## 2020-12-13 DIAGNOSIS — Z6830 Body mass index (BMI) 30.0-30.9, adult: Secondary | ICD-10-CM

## 2020-12-13 DIAGNOSIS — I10 Essential (primary) hypertension: Secondary | ICD-10-CM

## 2020-12-13 DIAGNOSIS — E785 Hyperlipidemia, unspecified: Secondary | ICD-10-CM | POA: Diagnosis not present

## 2020-12-13 MED ORDER — ESCITALOPRAM OXALATE 10 MG PO TABS: 10.0000 mg | ORAL_TABLET | Freq: Every day | ORAL | 1 refills | Status: DC

## 2020-12-13 MED ORDER — EZETIMIBE 10 MG PO TABS: 10.0000 mg | ORAL_TABLET | Freq: Every day | ORAL | 1 refills | Status: DC

## 2020-12-13 MED ORDER — LOSARTAN POTASSIUM-HCTZ 100-25 MG PO TABS: 1.0000 | ORAL_TABLET | Freq: Every day | ORAL | 3 refills | Status: DC

## 2020-12-13 MED ORDER — DONEPEZIL HCL 10 MG PO TABS: 10.0000 mg | ORAL_TABLET | Freq: Every day | ORAL | 1 refills | Status: DC

## 2020-12-13 MED ORDER — ROSUVASTATIN CALCIUM 10 MG PO TABS: 10.0000 mg | ORAL_TABLET | Freq: Every day | ORAL | 1 refills | Status: DC

## 2020-12-13 MED ORDER — MEMANTINE HCL 5 MG PO TABS: 5.0000 mg | ORAL_TABLET | Freq: Two times a day (BID) | ORAL | 1 refills | Status: DC

## 2020-12-13 NOTE — Progress Notes (Signed)
Subjective:    Patient ID: Richard Moore, male    DOB: 1960-12-17, 60 y.o.   MRN: [id number]   Chief Complaint: medical management of chronic issues     HPI:  1. Essential hypertension No c/o chest pain,  or headache. Does not check blood pressure at home. Is on zestoretic and has been coughing. BP Readings from Last 3 Encounters:  12/13/20 126/79  11/29/20 132/86  11/07/20 124/84      2. Hyperlipidemia with target LDL less than 100 Does not really watch diet and does no dedicated exercise. lipitor is causing muscles aches. Lab Results  Component Value Date   CHOL 113 03/15/2020   HDL 40 03/15/2020   LDLCALC 56 03/15/2020   TRIG 87 03/15/2020   CHOLHDL 2.8 03/15/2020     3. Pulmonary emphysema, unspecified emphysema type ([place]) Having lots of SOB- not heart related. Has seen pulmonology 5x in the last year. Has emphysema and is on inhaler. He is on spirivia and albuterol. Had echo which was normal. He has follow up in a few weeks.  4. Memory disturbance He thinks he is doing well. His wife is a patient here and she says that she thinks it is worsening. He is on aricept. MMSE - Mini Mental State Exam 12/13/2020 11/24/2018  Orientation to time 5 5  Orientation to Place 5 5  Registration 3 3  Attention/ Calculation 4 5  Recall 2 2  Language- name 2 objects 2 2  Language- repeat 1 1  Language- follow 3 step command 3 3  Language- read & follow direction 1 1  Write a sentence 1 1  Copy design 1 1  Total score 28 29      5. BMI 30.0-30.9,adult No recent weight changes Wt Readings from Last 3 Encounters:  12/13/20 255 lb (115.7 kg)  11/29/20 257 lb (116.6 kg)  11/07/20 258 lb 3.2 oz (117.1 kg)   BMI Readings from Last 3 Encounters:  12/13/20 31.87 kg/m  11/29/20 32.12 kg/m  11/07/20 32.27 kg/m      Outpatient Encounter Medications as of 12/13/2020  Medication Sig  . atorvastatin (LIPITOR) 40 MG tablet TAKE 1 TABLET DAILY  . donepezil (ARICEPT) 10  MG tablet TAKE 1 TABLET AT BEDTIME  . ezetimibe (ZETIA) 10 MG tablet TAKE 1 TABLET DAILY  . lisinopril-hydrochlorothiazide (ZESTORETIC) 10-12.5 MG tablet TAKE 1 TABLET DAILY  . Tiotropium Bromide Monohydrate (SPIRIVA RESPIMAT) 2.5 MCG/ACT AERS Inhale 2 puffs into the lungs daily.   Facility-Administered Encounter Medications as of 12/13/2020  Medication  . 0.9 %  sodium chloride infusion    Past Surgical History:  Procedure Laterality Date  . COLONOSCOPY    . INGUINAL HERNIA REPAIR     right  . VASECTOMY      Family History  Problem Relation Age of Onset  . Lung cancer Father        23 years ago  . Colon cancer Neg Hx   . Esophageal cancer Neg Hx   . Rectal cancer Neg Hx   . Stomach cancer Neg Hx     New complaints: Patient is complaining  of depression. He says has ben bothering him for awhile. Says it is time he take something. His wife sent message that he is quick to anger and cusses a lot. Depression screen Richard Moore 2/9 12/13/2020 03/15/2020 01/04/2020  Decreased Interest 0 0 0  Down, Depressed, Hopeless 0 0 0  PHQ - 2 Score 0 0 0  Altered sleeping  0 - -  Tired, decreased energy 3 - -  Change in appetite 0 - -  Feeling bad or failure about yourself  0 - -  Trouble concentrating 0 - -  Moving slowly or fidgety/restless 0 - -  Suicidal thoughts 0 - -  PHQ-9 Score 3 - -  Difficult doing work/chores Not difficult at all - -   GAD 7 : Generalized Anxiety Score 12/13/2020  Nervous, Anxious, on Edge 2  Control/stop worrying 1  Worry too much - different things 1  Trouble relaxing 1  Restless 0  Easily annoyed or irritable 2  Afraid - awful might happen 3  Total GAD 7 Score 10  Anxiety Difficulty Not difficult at all       Social history: Lives with wife and kids  Controlled substance contract: n/a    Review of Systems  Constitutional: Negative for diaphoresis.  Eyes: Negative for pain.  Respiratory: Negative for shortness of breath.   Cardiovascular: Negative for  chest pain, palpitations and leg swelling.  Gastrointestinal: Negative for abdominal pain.  Endocrine: Negative for polydipsia.  Skin: Negative for rash.  Neurological: Negative for dizziness, weakness and headaches.  Hematological: Does not bruise/bleed easily.  All other systems reviewed and are negative.      Objective:   Physical Exam Vitals and nursing note reviewed.  Constitutional:      Appearance: Normal appearance. He is well-developed.  HENT:     Head: Normocephalic.     Nose: Nose normal.  Eyes:     Pupils: Pupils are equal, round, and reactive to light.  Neck:     Thyroid: No thyroid mass or thyromegaly.     Vascular: No carotid bruit or JVD.     Trachea: Phonation normal.  Cardiovascular:     Rate and Rhythm: Normal rate and regular rhythm.  Pulmonary:     Effort: Pulmonary effort is normal. No respiratory distress.     Breath sounds: Normal breath sounds.  Abdominal:     General: Bowel sounds are normal.     Palpations: Abdomen is soft.     Tenderness: There is no abdominal tenderness.  Musculoskeletal:        General: Normal range of motion.     Cervical back: Normal range of motion and neck supple.  Lymphadenopathy:     Cervical: No cervical adenopathy.  Skin:    General: Skin is warm and dry.  Neurological:     Mental Status: He is alert and oriented to person, place, and time.  Psychiatric:        Behavior: Behavior normal.        Thought Content: Thought content normal.        Judgment: Judgment normal.    BP 126/79   Pulse 60   Temp (!) 97.4 F (36.3 C) (Temporal)   Resp 20   Ht _0  (1.905 m)   Wt 255 lb (115.7 kg)   SpO2 96%   BMI 31.87 kg/m         Assessment & Plan:  Richard Moore comes in today with chief complaint of Medical Management of Chronic Issues   Diagnosis and orders addressed:  1. Primary hypertension Low sodium diet Changed lisinopril to losartan /HCTZ - losartan-hydrochlorothiazide (HYZAAR) 100-25 MG  tablet; Take 1 tablet by mouth daily.  Dispense: 90 tablet; Refill: 3 - CBC with Differential/Platelet - CMP14+EGFR  2. Hyperlipidemia with target LDL less than 100 Low fat diet stopp lipitor- changed to crestor -  rosuvastatin (CRESTOR) 10 MG tablet; Take 1 tablet (10 mg total) by mouth daily.  Dispense: 90 tablet; Refill: 1 - ezetimibe (ZETIA) 10 MG tablet; Take 1 tablet (10 mg total) by mouth daily.  Dispense: 90 tablet; Refill: 1 - Lipid panel  3. Pulmonary emphysema, unspecified emphysema type ([place]) kepp follow up with pulmonologist  4. Memory disturbance Added namenda - memantine (NAMENDA) 5 MG tablet; Take 1 tablet (5 mg total) by mouth 2 (two) times daily.  Dispense: 180 tablet; Refill: 1 - donepezil (ARICEPT) 10 MG tablet; Take 1 tablet (10 mg total) by mouth at bedtime.  Dispense: 90 tablet; Refill: 1  5. BMI 30.0-30.9,adult Discussed diet and exercise for person with BMI >25 Will recheck weight in 3-6 months  6. Anxiety Stress management - escitalopram (LEXAPRO) 10 MG tablet; Take 1 tablet (10 mg total) by mouth daily.  Dispense: 90 tablet; Refill: 1  7. Prostate cancer screening  - PSA, total and free   Labs pending Health Maintenance reviewed Diet and exercise encouraged  Follow up plan: 6 months   [place], FNP

## 2020-12-13 NOTE — Patient Instructions (Signed)
Textbook of family medicine (9th ed., pp. 1062-1073). [place], [place]: [employer].">  Stress, Adult Stress is a normal reaction to life events. Stress is what you feel when life demands more than you are used to, or more than you think you can handle. Some stress can be useful, such as studying for a test or meeting a deadline at work. Stress that occurs too often or for too long can cause problems. It can affect your emotional health and interfere with relationships and normal daily activities. Too much stress can weaken your body's defense system (immune system) and increase your risk for physical illness. If you already have a medical problem, stress can make it worse. What are the causes? All sorts of life events can cause stress. An event that causes stress for one person may not be stressful for another person. Major life events, whether positive or negative, commonly cause stress. Examples include:  Losing a job or starting a new job.  Losing a loved one.  Moving to a new town or home.  Getting married or divorced.  Having a baby.  Getting injured or sick. Less obvious life events can also cause stress, especially if they occur day after day or in combination with each other. Examples include:  Working long hours.  Driving in traffic.  Caring for children.  Being in debt.  Being in a difficult relationship. What are the signs or symptoms? Stress can cause emotional symptoms, including:  Anxiety. This is feeling worried, afraid, on edge, overwhelmed, or out of control.  Anger, including irritation or impatience.  Depression. This is feeling sad, down, helpless, or guilty.  Trouble focusing, remembering, or making decisions. Stress can cause physical symptoms, including:  Aches and pains. These may affect your head, neck, back, stomach, or other areas of your body.  Tight muscles or a clenched jaw.  Low energy.  Trouble sleeping. Stress can cause unhealthy  behaviors, including:  Eating to feel better (overeating) or skipping meals.  Working too much or putting off tasks.  Smoking, drinking alcohol, or using drugs to feel better. How is this diagnosed? Stress is diagnosed through an assessment by your health care provider. He or she may diagnose this condition based on:  Your symptoms and any stressful life events.  Your medical history.  Tests to rule out other causes of your symptoms. Depending on your condition, your health care provider may refer you to a specialist for further evaluation. How is this treated? Stress management techniques are the recommended treatment for stress. Medicine is not typically recommended for the treatment of stress. Techniques to reduce your reaction to stressful life events include:  Stress identification. Monitor yourself for symptoms of stress and identify what causes stress for you. These skills may help you to avoid or prepare for stressful events.  Time management. Set your priorities, keep a calendar of events, and learn to say no. Taking these actions can help you avoid making too many commitments. Techniques for coping with stress include:  Rethinking the problem. Try to think realistically about stressful events rather than ignoring them or overreacting. Try to find the positives in a stressful situation rather than focusing on the negatives.  Exercise. Physical exercise can release both physical and emotional tension. The key is to find a form of exercise that you enjoy and do it regularly.  Relaxation techniques. These relax the body and mind. The key is to find one or more that you enjoy and use the techniques regularly. Examples include: ?   Meditation, deep breathing, or progressive relaxation techniques. ? Yoga or tai chi. ? Biofeedback, mindfulness techniques, or journaling. ? Listening to music, being out in nature, or participating in other hobbies.  Practicing a healthy lifestyle.  Eat a balanced diet, drink plenty of water, limit or avoid caffeine, and get plenty of sleep.  Having a strong support network. Spend time with family, friends, or other people you enjoy being around. Express your feelings and talk things over with someone you trust. Counseling or talk therapy with a mental health professional may be helpful if you are having trouble managing stress on your own.   Follow these instructions at home: Lifestyle  Avoid drugs.  Do not use any products that contain nicotine or tobacco, such as cigarettes, e-cigarettes, and chewing tobacco. If you need help quitting, ask your health care provider.  Limit alcohol intake to no more than 1 drink a day for nonpregnant women and 2 drinks a day for men. One drink equals 12 oz of beer, 5 oz of wine, or 1 oz of hard liquor  Do not use alcohol or drugs to relax.  Eat a balanced diet that includes fresh fruits and vegetables, whole grains, lean meats, fish, eggs, and beans, and low-fat dairy. Avoid processed foods and foods high in added fat, sugar, and salt.  Exercise at least 30 minutes on 5 or more days each week.  Get 7-8 hours of sleep each night.   General instructions  Practice stress management techniques as discussed with your health care provider.  Drink enough fluid to keep your urine clear or pale yellow.  Take over-the-counter and prescription medicines only as told by your health care provider.  Keep all follow-up visits as told by your health care provider. This is important.   Contact a health care provider if:  Your symptoms get worse.  You have new symptoms.  You feel overwhelmed by your problems and can no longer manage them on your own. Get help right away if:  You have thoughts of hurting yourself or others. If you ever feel like you may hurt yourself or others, or have thoughts about taking your own life, get help right away. You can go to your nearest emergency department or  call:  Your local emergency services (911 in the [place]  A suicide crisis helpline, such as the National Suicide Prevention Lifeline at 1-800-273-8255. This is open 24 hours a day. Summary  Stress is a normal reaction to life events. It can cause problems if it happens too often or for too long.  Practicing stress management techniques is the best way to treat stress.  Counseling or talk therapy with a mental health professional may be helpful if you are having trouble managing stress on your own. This information is not intended to replace advice given to you by your health care provider. Make sure you discuss any questions you have with your health care provider. Document Revised: 05/17/2020 Document Reviewed: 05/17/2020 [employer] Patient Education  2021 [employer]  

## 2020-12-14 LAB — PSA, TOTAL AND FREE
PSA, Free Pct: 43.3 %
PSA, Free: 0.13 ng/mL
Prostate Specific Ag, Serum: 0.3 ng/mL (ref 0.0–4.0)

## 2020-12-14 LAB — CBC WITH DIFFERENTIAL/PLATELET
Basophils Absolute: 0 10*3/uL (ref 0.0–0.2)
Basos: 1 %
EOS (ABSOLUTE): 0.1 10*3/uL (ref 0.0–0.4)
Eos: 2 %
Hematocrit: 49.1 % (ref 37.5–51.0)
Hemoglobin: 16.6 g/dL (ref 13.0–17.7)
Immature Grans (Abs): 0 10*3/uL (ref 0.0–0.1)
Immature Granulocytes: 0 %
Lymphocytes Absolute: 2.2 10*3/uL (ref 0.7–3.1)
Lymphs: 38 %
MCH: 30.4 pg (ref 26.6–33.0)
MCHC: 33.8 g/dL (ref 31.5–35.7)
MCV: 90 fL (ref 79–97)
Monocytes Absolute: 0.7 10*3/uL (ref 0.1–0.9)
Monocytes: 13 %
Neutrophils Absolute: 2.7 10*3/uL (ref 1.4–7.0)
Neutrophils: 46 %
Platelets: 150 10*3/uL (ref 150–450)
RBC: 5.46 x10E6/uL (ref 4.14–5.80)
RDW: 13.2 % (ref 11.6–15.4)
WBC: 5.7 10*3/uL (ref 3.4–10.8)

## 2020-12-14 LAB — CMP14+EGFR
ALT: 62 IU/L — ABNORMAL HIGH (ref 0–44)
AST: 36 IU/L (ref 0–40)
Albumin/Globulin Ratio: 2 (ref 1.2–2.2)
Albumin: 4.7 g/dL (ref 3.8–4.9)
Alkaline Phosphatase: 42 IU/L — ABNORMAL LOW (ref 44–121)
BUN/Creatinine Ratio: 13 (ref 9–20)
BUN: 12 mg/dL (ref 6–24)
Bilirubin Total: 0.6 mg/dL (ref 0.0–1.2)
CO2: 23 mmol/L (ref 20–29)
Calcium: 9.6 mg/dL (ref 8.7–10.2)
Chloride: 98 mmol/L (ref 96–106)
Creatinine, Ser: 0.91 mg/dL (ref 0.76–1.27)
Globulin, Total: 2.3 g/dL (ref 1.5–4.5)
Glucose: 112 mg/dL — ABNORMAL HIGH (ref 65–99)
Potassium: 4.5 mmol/L (ref 3.5–5.2)
Sodium: 136 mmol/L (ref 134–144)
Total Protein: 7 g/dL (ref 6.0–8.5)
eGFR: 98 mL/min/{1.73_m2} (ref >59)

## 2020-12-14 LAB — LIPID PANEL
Chol/HDL Ratio: 3.1 ratio (ref 0.0–5.0)
Cholesterol, Total: 135 mg/dL (ref 100–199)
HDL: 44 mg/dL (ref >39)
LDL Chol Calc (NIH): 67 mg/dL (ref 0–99)
Triglycerides: 138 mg/dL (ref 0–149)
VLDL Cholesterol Cal: 24 mg/dL (ref 5–40)

## 2020-12-20 DIAGNOSIS — J189 Pneumonia, unspecified organism: Secondary | ICD-10-CM | POA: Diagnosis not present

## 2020-12-20 DIAGNOSIS — Z03818 Encounter for observation for suspected exposure to other biological agents ruled out: Secondary | ICD-10-CM | POA: Diagnosis not present

## 2020-12-28 DIAGNOSIS — J189 Pneumonia, unspecified organism: Secondary | ICD-10-CM | POA: Diagnosis not present

## 2021-02-07 ENCOUNTER — Encounter: Payer: Self-pay | Admitting: Internal Medicine

## 2021-02-07 ENCOUNTER — Ambulatory Visit (INDEPENDENT_AMBULATORY_CARE_PROVIDER_SITE_OTHER): Payer: BC Managed Care – PPO | Admitting: Internal Medicine

## 2021-02-07 ENCOUNTER — Other Ambulatory Visit: Payer: Self-pay

## 2021-02-07 ENCOUNTER — Ambulatory Visit (INDEPENDENT_AMBULATORY_CARE_PROVIDER_SITE_OTHER): Payer: BC Managed Care – PPO

## 2021-02-07 VITALS — BP 126/80 | HR 65 | Temp 97.5°F | Ht 75.0 in | Wt 249.4 lb

## 2021-02-07 DIAGNOSIS — R079 Chest pain, unspecified: Secondary | ICD-10-CM

## 2021-02-07 DIAGNOSIS — J439 Emphysema, unspecified: Secondary | ICD-10-CM

## 2021-02-07 DIAGNOSIS — I2510 Atherosclerotic heart disease of native coronary artery without angina pectoris: Secondary | ICD-10-CM | POA: Diagnosis not present

## 2021-02-07 DIAGNOSIS — J849 Interstitial pulmonary disease, unspecified: Secondary | ICD-10-CM | POA: Diagnosis not present

## 2021-02-07 DIAGNOSIS — R0609 Other forms of dyspnea: Secondary | ICD-10-CM

## 2021-02-07 DIAGNOSIS — R0600 Dyspnea, unspecified: Secondary | ICD-10-CM

## 2021-02-07 NOTE — Patient Instructions (Addendum)
Dyspnea on exertion  - all below could be contributing but also could be deconditoned  Plan  - refer pulmonary rehab but start only after cleared by cardiology  - follow advice below  Chest pain, exertional Coronary artery calcification seen on CAT scan  - refer cardiology for rstress test  Pulmonary emphysema, unspecified emphysema type ([name])  - stable after recent flare up v pneumonia April 2022  Plan  -cxr 2 view today   - continue spiriva daily with alb as needed - check alpha 1 AT phenotyple  ILD (interstitial lung disease) ([name])  - this is mild and post inflamatory from 2013 pneumonia. Stable between CT 2019 a=-> 2022 - doubt wil progress but got to keep an eye  Plan  - PFT in 1 year - check ANA, RF, CCP  Followup - 6 months or sooner if needed  - ILD symp score and walk test at [facility]

## 2021-02-07 NOTE — Addendum Note (Signed)
Addended by: Suzzanne Cloud E on: 02/07/2021 10:08 AM   Modules accepted: Orders

## 2021-02-07 NOTE — Progress Notes (Addendum)
Subjective:     Patient ID: Richard Moore, male   DOB: 08-07-1961, 60 y.o.   MRN: [id number]  PCP Chevis Pretty, FNP  HPI  IOV 12/16/2017  Chief Complaint  Patient presents with  . Consult    COPD per [facility]. Pt last saw MR 06/22/12.  Pt does have c/o cough sometimes with brown to yellow mucus.    60 year old male who says that in 2013 I took care of him for pneumonia.  Review of the chart and visualization shows that he had CT scan evidence of left lower lobe pneumonia in 2013.  After that he had complete recovery but he says clinically he did not follow-up with me.  Recently went and saw her primary care physician according to his history for left upper back pain along with lower back mid spinal area pain that is chronic.  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  As part of this evaluation for pain he had a chest x-ray that then they reported to him he has COPD [I visualized the chest x-ray from February 2019 and confirmed the presence of hyperinflation and right lower lobe atelectasis] therefore he has been referred here.  He says he that he does not have any symptoms but when I started questioning him again he started admitting to chronic cough for many years.  It happens early in the morning when he brushes the teeth.  His wife notices it.  It does not bother him.  Therefore it is mild to moderate in intensity.  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  Several years ago this was not the case.  There are no other symptoms.  Med review history shows associated lisinopril intake for many years.  Results for IAIN, SAWCHUK (MRN [id number]) as of 12/16/2017 09:30  Ref. Range 11/05/2017 17:19  Creatinine Latest Ref Range: 0.76 - 1.27 mg/dL 0.94   Results for GERHARD, RAPPAPORT (MRN [id number]) as of  12/16/2017 09:30  Ref. Range 11/05/2017 17:19  Hemoglobin Latest Ref Range: 13.0 - 17.7 g/dL 16.1    CT Chest data April 2019  IMPRESSION: 1. Scattered areas of peribronchovascular and subpleural ground-glass, with somewhat of a mid and lower lung zone predominance. Findings are nonspecific and may be post infectious/postinflammatory in etiology when compared with 02/29/2012. Nonspecific interstitial pneumonitis is not excluded. 2. 4 mm subpleural right lower lobe nodule, likely benign. No follow-up needed if patient is low-risk. Non-contrast chest CT can be considered in 12 months if patient is high-risk. This recommendation follows the consensus statement: Guidelines for Management of Incidental Pulmonary Nodules Detected on CT Images: From the Fleischner Society 2017; Radiology 2017; 284:228-243. 3.  Emphysema (ICD10-J43.9). 4. Aortic atherosclerosis (ICD10-170.0). Coronary artery calcification. 5. Tiny right renal stone.   Electronically Signed   By: Lorin Picket M.D.   On: 01/01/2018 08:47     OV 11/07/2020  Subjective:  Patient ID: Richard Moore, male , DOB: Sep 09, 1961 , age 71 y.o. , MRN: [id number] , ADDRESS: [place] [address] PCP Chevis Pretty, FNP Patient Care Team: Chevis Pretty, FNP as PCP - General (Nurse Practitioner)  This Provider for this visit: Treatment Team:  Attending Provider: Brand Males, MD    11/07/2020 -   Chief Complaint  Patient presents with  . Follow-up  Recently had PNA in Jan, still winded easily     HPI Richard Moore 60 y.o. - returns for follow-up.  It is just shy of 3 years since I last saw him.  Therefore technically is an established patient.  He says after he last saw me for shortness of breath and cough [he is on lisinopril] he had CT scan of the chest that showed a 4 mm nodule but also emphysema and plus minus early ILD.  He says he was sent by [place] to  work in [place].  He was only coming to [place] on the weekends.  Therefore has been unable to keep up with follow-up.  He says he had his Covid vaccine [not documented in our records].  He has had 2 shots of this.  He says that over the last 3 years he has had insidious worsening of shortness of breath with exertion relieved by rest.  He also has cough.  There is no associated chest pain orthopnea proximal nocturnal dyspnea.  He could be snoring.  Symptoms suggest progressive.  He is desperate about the severity of symptoms.  Mid January 2022 he had bronchitis symptoms which he described.  He went to an urgent care and was told he had double pneumonia.  But he says he was never Covid tested.  He never had a chest x-ray but was given antibiotics for "double pneumonia".  He says this is set him back.   CXR Feb 2019  Narrative & Impression  CLINICAL DATA:  Chest pain and cough.  EXAM: CHEST  2 VIEW  COMPARISON:  Chest radiograph June 30, 2013  FINDINGS: Cardiomediastinal silhouette is normal. No pleural effusions or focal consolidations. Similar mild interstitial prominence. Strandy densities RIGHT lung base. Trachea projects midline and there is no pneumothorax. Soft tissue planes and included osseous structures are non-suspicious.  IMPRESSION: Mild suspected COPD with RIGHT lung base atelectasis.   Electronically Signed   By: Elon Alas M.D.   On: 11/06/2017 01:43      .   11/29/2020 Follow up ; COPD, chronic cough, abnormal CT chest and right lung nodule Patient returns for a 1 month follow-up.  Patient was seen last visit for a follow-up with complaints of shortness of breath and cough lingering for 1 month. Sick in January 2022, dx with Pneumonia . Treated with antibiotics and steroids . Got better but cough and dyspnea persisted. Patient was set up for multiple tests and lab work.  Last visit labs showed a negative D-dimer, BNP.  Mildly elevated LFT with ALT  at 54.  Normal CBC with eosinophils at absolute count 100.  Platelets were slightly low at 128,000. High-resolution CT chest and 2D echo are pending. Covid IGG was very high indicative of recent infection.  He was started on Spiriva last visit but did not get this at the pharmacy.  We called the pharmacy but prescription was not sent .  Since last visit patient says he is feeling about the same.  Gets short of breath with activities.  Has a daily chronic cough.  Patient is fully vaccinated for COVID-19.  Of note patient is on a ACE inhibitor. He denies any fever or discolored mucus chest pain orthopnea PND or increased leg swelling. PFTs today show mild restriction with an FEV1 at 83%, ratio 83, FVC 76%.  No significant bronchodilator response, DLCO 85%.  Plan Begin Spiriva 2 puffs daily .  Activity as tolerated Once CT and Echo are resulted,results will  be called.  Discussed with primary care provider that Zestoretic (lisinopril) could be making your cough worse, may need an alternative if able Follow-up with Dr. Chase Caller in 6 weeks and as needed Please contact office for sooner follow up if symptoms do not improve or worsen or seek emergency care    OV 02/07/2021  Subjective:  Patient ID: Richard Moore, male , DOB: March 19, 1961 , age 46 y.o. , MRN: [id number] , ADDRESS: [place] [address] PCP Chevis Pretty, FNP Patient Care Team: Chevis Pretty, FNP as PCP - General (Nurse Practitioner)  This Provider for this visit: Treatment Team:  Attending Provider: Brand Males, MD    02/07/2021 -   Chief Complaint  Patient presents with  . Follow-up    2 mo f/u. States he developed PNA again back in April 2022.    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 -presents for follow-up.  Last seen by myself in February 2022.  He then  followed up with nurse practitioner in March 2022 and was started on Spiriva.  After that he had high-resolution CT chest.  I personally visualized this.  He has some basilar postinflammatory ILD changes.  This is mild.  It is unchanged compared to 2019.  I suspect this is postinflammatory ILD.  His pulmonary function test shows restriction with lower diffusion reflecting combination of emphysema and ILD.  On walking desaturation test today he did not desaturate.  He was started on Spiriva and this is helping.  In April 2022 he had pneumonia according to him but he describes to me as COPD exacerbation symptoms.  Resolved after 2 rounds of antibiotics and 1 round of prednisone.  He says his x-ray showed pneumonia I do not have that x-ray with [place].  The x-ray was done after the CT scan.  So the CT scan does not show any pneumonia.  His main complaint is that he is very short of breath particularly walking upstairs and at work.  Sometimes there is associated exertional chest pain.  He does have coronary artery calcification on the CT.  His echo was normal but he is never had a cardiac stress test seen a cardiologist.  His symptoms definitely seem out of proportion to his walking desaturation test.   SYMPTOM SCALE - ILD 02/07/2021   O2 use ra  Shortness of Breath 0 -> 5 scale with 5 being worst (score 6 If unable to do)  At rest 2  Simple tasks - showers, clothes change, eating, shaving 3  Household (dishes, doing bed, laundry) 3  Shopping 3  Walking level at own pace 3  Walking up Stairs 5  Total (30-36) Dyspnea Score 19  How bad is your cough? 2  How bad is your fatigue 4  How bad is nausea 0  How bad is vomiting?  0  How bad is diarrhea? 4  How bad is anxiety? 3  How bad is depression 1      Simple office walk 185 feet x  3 laps goal with forehead probe 02/07/2021   O2 used ra  Number laps completed 3  Comments about pace x  Resting Pulse Ox/HR 98% and 65/min  Final Pulse Ox/HR 99% and  86/min  Desaturated </= 88% no  Desaturated <= 3% points no  Got Tachycardic >/= 90/min no  Symptoms at end of test Dyspnea t end of 2nd lap  Miscellaneous comments x     HRCT 11/29/20  Narrative & Impression  CLINICAL DATA:  60 year old male with history of shortness of breath. Chronic cough.  EXAM: CT CHEST WITHOUT CONTRAST  TECHNIQUE: Multidetector CT imaging of the chest was performed following the standard protocol without intravenous contrast. High resolution imaging of the lungs, as well as inspiratory and expiratory imaging, was performed.  COMPARISON:  High-resolution chest CT 12/31/2017.  FINDINGS: Cardiovascular: Heart size is normal. There is no significant pericardial fluid, thickening or pericardial calcification. There is aortic atherosclerosis, as well as atherosclerosis of the great vessels of the mediastinum and the coronary arteries, including calcified atherosclerotic plaque in the left anterior descending and left circumflex coronary arteries.  Mediastinum/Nodes: No pathologically enlarged mediastinal or hilar lymph nodes. Please note that accurate exclusion of hilar adenopathy is limited on noncontrast CT scans. Esophagus is unremarkable in appearance. No axillary lymphadenopathy.  Lungs/Pleura: High-resolution images again demonstrate some patchy areas of peripheral predominant ground-glass attenuation, septal thickening, mild cylindrical bronchiectasis and peripheral bronchiolectasis. These findings have no definitive craniocaudal gradient. No definitive honeycombing confidently identified. Inspiratory and expiratory imaging demonstrates some mild air trapping indicative of mild small airways disease. Mild centrilobular and paraseptal emphysema again noted. No acute consolidative airspace disease. No pleural effusions. No suspicious appearing pulmonary nodules or masses are noted. Small calcified granuloma in the periphery of the right  upper lobe.  Upper Abdomen: Diffuse low attenuation throughout the visualized hepatic parenchyma, indicative of hepatic steatosis.  Musculoskeletal: There are no aggressive appearing lytic or blastic lesions noted in the visualized portions of the skeleton.  IMPRESSION: 1. The appearance of the lungs is compatible with interstitial lung disease, with a spectrum of findings considered most compatible with an alternative diagnosis (not usual interstitial pneumonia) per current ATS guidelines. Overall, findings have been stable compared to prior study from 2019 and are favored to reflect mild nonspecific interstitial pneumonia (NSIP) in conjunction with a background of mild centrilobular and paraseptal emphysema. 2. Aortic atherosclerosis, in addition to 2 vessel coronary artery disease. Please note that although the presence of coronary artery calcium documents the presence of coronary artery disease, the severity of this disease and any potential stenosis cannot be assessed on this non-gated CT examination. Assessment for potential risk factor modification, dietary therapy or pharmacologic therapy may be warranted, if clinically indicated. 3. Hepatic steatosis.  Aortic Atherosclerosis (ICD10-I70.0) and Emphysema (ICD10-J43.9).   Electronically Signed   By: Vinnie Langton M.D.   On: 11/29/2020 12:05      ECHO 11/29/20   IMPRESSIONS    1. Left ventricular ejection fraction, by estimation, is 60 to 65%. The  left ventricle has normal function. The left ventricle has no regional  wall motion abnormalities. Left ventricular diastolic parameters were  normal. The average left ventricular  global longitudinal strain is 17.6 %. The global longitudinal strain is  normal.  2. Right ventricular systolic function is normal. The right ventricular  size is normal. There is normal pulmonary artery systolic pressure.  3. The mitral valve is normal in structure. Trivial  mitral valve  regurgitation. No evidence of mitral stenosis.  4. The aortic valve is tricuspid. Aortic valve regurgitation is not  visualized. No aortic stenosis is present.  5. Aortic dilatation noted. There is borderline dilatation of the  ascending aorta, measuring 38 mm.  6. The inferior vena cava is normal in size with greater than 50%  respiratory variability, suggesting right atrial pressure of 3 mmHg.  PFT  PFT Results  Latest Ref Rng & Units 11/29/2020  FVC-Pre L 4.38  FVC-Predicted Pre % 74  FVC-Post L 4.50  FVC-Predicted Post % 76  Pre FEV1/FVC % % 82  Post FEV1/FCV % % 83  FEV1-Pre L 3.60  FEV1-Predicted Pre % 80  FEV1-Post L 3.75  DLCO uncorrected ml/min/mmHg 28.44  DLCO UNC% % 85  DLCO corrected ml/min/mmHg 27.35  DLCO COR %Predicted % 81  DLVA Predicted % 105  TLC L 6.17  TLC % Predicted % 75  RV % Predicted % 70       has a past medical history of COPD (chronic obstructive pulmonary disease) (HCC), GERD (gastroesophageal reflux disease), Hyperlipidemia, Hypertension, Pneumonia, and Sleep apnea.   reports that he quit smoking about 13 years ago. His smoking use included cigarettes. He has a 30.00 pack-year smoking history. He has never used smokeless tobacco.  Past Surgical History:  Procedure Laterality Date  . COLONOSCOPY    . INGUINAL HERNIA REPAIR     right  . VASECTOMY      No Known Allergies   There is no immunization history on file for this patient.  Family History  Problem Relation Age of Onset  . Lung cancer Father        23 years ago  . Colon cancer Neg Hx   . Esophageal cancer Neg Hx   . Rectal cancer Neg Hx   . Stomach cancer Neg Hx      Current Outpatient Medications:  .  albuterol (VENTOLIN HFA) 108 (90 Base) MCG/ACT inhaler, Inhale into the lungs every 6 (six) hours as needed for wheezing or shortness of breath., Disp: , Rfl:  .  donepezil (ARICEPT) 10 MG tablet, Take 1 tablet (10 mg total) by mouth at bedtime., Disp: 90  tablet, Rfl: 1 .  escitalopram (LEXAPRO) 10 MG tablet, Take 1 tablet (10 mg total) by mouth daily., Disp: 90 tablet, Rfl: 1 .  ezetimibe (ZETIA) 10 MG tablet, Take 1 tablet (10 mg total) by mouth daily., Disp: 90 tablet, Rfl: 1 .  losartan-hydrochlorothiazide (HYZAAR) 100-25 MG tablet, Take 1 tablet by mouth daily., Disp: 90 tablet, Rfl: 3 .  memantine (NAMENDA) 5 MG tablet, Take 1 tablet (5 mg total) by mouth 2 (two) times daily., Disp: 180 tablet, Rfl: 1 .  rosuvastatin (CRESTOR) 10 MG tablet, Take 1 tablet (10 mg total) by mouth daily., Disp: 90 tablet, Rfl: 1 .  Tiotropium Bromide Monohydrate (SPIRIVA RESPIMAT) 2.5 MCG/ACT AERS, Inhale 2 puffs into the lungs daily., Disp: 1 g, Rfl: 5  Current Facility-Administered Medications:  .  0.9 %  sodium chloride infusion, 500 mL, Intravenous, Continuous, Milus Banister, MD      Objective:   Vitals:   02/07/21 0905  BP: 126/80  Pulse: 65  Temp: (!) 97.5 F (36.4 C)  TempSrc: Temporal  SpO2: 98%  Weight: 249 lb 6.4 oz (113.1 kg)  Height: 6\' 3"  (1.905 m)    Estimated body mass index is 31.17 kg/m as calculated from the following:   Height as of this encounter: 6\' 3"  (1.905 m).   Weight as of this encounter: 249 lb 6.4 oz (113.1 kg).  @WEIGHTCHANGE @  [employer]   02/07/21 0905  Weight: 249 lb 6.4 oz (113.1 kg)     Physical Exam General: No distress. Looks well Neuro: Alert and Oriented x 3. GCS 15. Speech normal Psych: Pleasant Resp:  Barrel Chest - no.  Wheeze - no, Crackles - no, No overt respiratory distress CVS: Normal heart  sounds. Murmurs - no Ext: Stigmata of Connective Tissue Disease - no HEENT: Normal upper airway. PEERL +. No post nasal drip        Assessment:       ICD-10-CM   1. Dyspnea on exertion  R06.00 Alpha-1 antitrypsin phenotype    Alpha-1 antitrypsin phenotype  2. Chest pain, exertional  R07.9 Ambulatory referral to Cardiology  3. Coronary artery calcification seen on CAT scan  I25.10  Ambulatory referral to Cardiology  4. Pulmonary emphysema, unspecified emphysema type ([place])  J43.9 DG Chest 2 View    Pulmonary function test  5. ILD (interstitial lung disease) (HCC)  J84.9 Alpha-1 antitrypsin phenotype    Alpha-1 antitrypsin phenotype    CYCLIC CITRUL PEPTIDE ANTIBODY, IGG/IGA    Rheumatoid Factor    ANA       Plan:     Patient Instructions  Dyspnea on exertion  - all below could be contributing but also could be deconditoned  Plan  - refer pulmonary rehab but start only after cleared by cardiology  - follow advice below  Chest pain, exertional Coronary artery calcification seen on CAT scan  - refer cardiology for rstress test  Pulmonary emphysema, unspecified emphysema type ([place])  - stable after recent flare up v pneumonia April 2022  Plan  -cxr 2 view today   - continue spiriva daily with alb as needed - check alpha 1 AT phenotyple  ILD (interstitial lung disease) ([place])  - this is mild and post inflamatory from 2013 pneumonia. Stable between CT 2019 a=-> 2022 - doubt wil progress but got to keep an eye  Plan  - PFT in 1 year - check ANA, RF, CCP  Followup - 6 months or sooner if needed  - ILD symp score and walk test at fllowup      SIGNATURE    Dr. Brand Males, M.D., F.C.C.P,  Pulmonary and Critical Care Medicine Staff Physician, [place] Director - Interstitial Lung Disease  Program  Pulmonary [facility] [place], [place], [address]  Pager: [phone], If no answer or between  15:00h - 7:00h: call [phone] Telephone: [phone]  10:08 AM 02/07/2021

## 2021-02-11 LAB — RHEUMATOID FACTOR: Rheumatoid fact SerPl-aCnc: <10 IU/mL (ref <14.0)

## 2021-02-11 LAB — ANA: Anti Nuclear Antibody (ANA): NEGATIVE

## 2021-02-11 LAB — CYCLIC CITRUL PEPTIDE ANTIBODY, IGG/IGA: Cyclic Citrullin Peptide Ab: 3 units (ref 0–19)

## 2021-02-12 ENCOUNTER — Encounter: Payer: Self-pay | Admitting: Internal Medicine

## 2021-02-19 NOTE — Progress Notes (Signed)
No pneumonia on CXR

## 2021-03-04 ENCOUNTER — Other Ambulatory Visit: Payer: Self-pay | Admitting: Nurse Practitioner

## 2021-03-04 DIAGNOSIS — E785 Hyperlipidemia, unspecified: Secondary | ICD-10-CM

## 2021-03-04 DIAGNOSIS — R413 Other amnesia: Secondary | ICD-10-CM

## 2021-03-04 LAB — ALPHA-1 ANTITRYPSIN PHENOTYPE: A-1 Antitrypsin, Ser: 98 mg/dL (ref 83–199)

## 2021-03-05 ENCOUNTER — Other Ambulatory Visit: Payer: Self-pay | Admitting: *Deleted

## 2021-03-05 DIAGNOSIS — I10 Essential (primary) hypertension: Secondary | ICD-10-CM

## 2021-03-05 DIAGNOSIS — R413 Other amnesia: Secondary | ICD-10-CM

## 2021-03-05 DIAGNOSIS — E785 Hyperlipidemia, unspecified: Secondary | ICD-10-CM

## 2021-03-05 DIAGNOSIS — F419 Anxiety disorder, unspecified: Secondary | ICD-10-CM

## 2021-03-05 MED ORDER — LOSARTAN POTASSIUM-HCTZ 100-25 MG PO TABS: 1.0000 | ORAL_TABLET | Freq: Every day | ORAL | 0 refills | Status: DC

## 2021-03-05 MED ORDER — MEMANTINE HCL 5 MG PO TABS: 5.0000 mg | ORAL_TABLET | Freq: Two times a day (BID) | ORAL | 0 refills | Status: DC

## 2021-03-05 MED ORDER — ESCITALOPRAM OXALATE 10 MG PO TABS: 10.0000 mg | ORAL_TABLET | Freq: Every day | ORAL | 0 refills | Status: DC

## 2021-03-05 MED ORDER — ROSUVASTATIN CALCIUM 10 MG PO TABS: 10.0000 mg | ORAL_TABLET | Freq: Every day | ORAL | 0 refills | Status: DC

## 2021-03-07 ENCOUNTER — Other Ambulatory Visit: Payer: Self-pay

## 2021-03-07 MED ORDER — SPIRIVA RESPIMAT 2.5 MCG/ACT IN AERS: 2.0000 | INHALATION_SPRAY | Freq: Every day | RESPIRATORY_TRACT | 3 refills | Status: DC

## 2021-03-09 NOTE — Progress Notes (Signed)
Alpha 1 AT - one gene slightoly abnormal. Plan - ensure followup per recent OV

## 2021-03-21 ENCOUNTER — Encounter: Payer: BC Managed Care – PPO | Admitting: Nurse Practitioner

## 2021-04-11 ENCOUNTER — Ambulatory Visit: Payer: BC Managed Care – PPO | Admitting: Internal Medicine

## 2021-05-02 ENCOUNTER — Ambulatory Visit: Payer: BC Managed Care – PPO | Admitting: Internal Medicine

## 2021-05-06 NOTE — Progress Notes (Signed)
CARDIOLOGY CONSULT NOTE       Patient ID: Richard Moore MRN: [id number] DOB/AGE: 1961-04-29 60 y.o.  Admit date: (Not on file) Referring Physician: Hassell Done Primary Physician: Chevis Pretty, FNP Primary Cardiologist: New Reason for Consultation: CAD  Active Problems:   * No active hospital problems. *   HPI:  61 y.o. referred by Dr Hassell Done for chest pain and calcium seen on CT scan History of COPD, GERD, HLD, HTN, OSA with oral appliance use Sees Dr Chase Caller for pulmonary with chronic cough and some exertional dyspnea CT in 2019 noted nonspecific pneumonitis and also commented on aortic atherosclerosis and coronary calcification ? Early ILD and 4 mm nodule PFTls with FEV183% mild restriction no dilator response and DLCO 85% Quit smoking about 13 years ago   TTE done 11/29/20 EF [id number] normal diastolic parameters normal RV no significant valve disease or pulmonary HTN and aortic root 3.8 cm   CT 11/29/20 showed moderate calcification of the mid/distal LAD and mild calcification in the proximal circumflex  He has some tightness in his chest with dyspnea Shared decision making discussed options for risk stratifying obstructive CAD. Favor cardiac CTA  His wife is Camera operator on 5W on weekends Two older children and one 20 yo at home 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 32 acres in [place] and like to be outside  After lengthy talk turns out he went to [employer] with my wife at [place] All other systems reviewed and negative except as noted above  Past Medical History:  Diagnosis Date   COPD (chronic obstructive pulmonary disease) ([place])    GERD (gastroesophageal reflux disease)    Hyperlipidemia    Hypertension    Pneumonia    Sleep apnea    doesn't wear CPAP- uses mouth piece now    Family History  Problem Relation Age of Onset   Lung cancer Father        23 years ago   Colon cancer Neg Hx    Esophageal cancer Neg Hx    Rectal cancer Neg Hx    Stomach  cancer Neg Hx     Social History   Socioeconomic History   Marital status: Married    Spouse name: Not on file   Number of children: Not on file   Years of education: Not on file   Highest education level: Not on file  Occupational History   Occupation: put in IT consultant    Employer: [employer]  Tobacco Use   Smoking status: Former    Packs/day: 2.00    Years: 15.00    Pack years: 30.00    Types: Cigarettes    Quit date: 09/15/2007    Years since quitting: 13.6   Smokeless tobacco: Never  Vaping Use   Vaping Use: Never used  Substance and Sexual Activity   Alcohol use: Yes    Alcohol/week: 6.0 standard drinks    Types: 6 Standard drinks or equivalent per week    Comment: occasionally   Drug use: No   Sexual activity: Not on file  Other Topics Concern   Not on file  Social History Narrative   Not on file   Social Determinants of Health   Financial Resource Strain: Not on file  Food Insecurity: Not on file  Transportation Needs: Not on file  Physical Activity: Not on file  Stress: Not on file  Social Connections: Not on file  Intimate Partner Violence: Not on file    Past  Surgical History:  Procedure Laterality Date   COLONOSCOPY     INGUINAL HERNIA REPAIR     right   VASECTOMY        Current Outpatient Medications:    metoprolol tartrate (LOPRESSOR) 50 MG tablet, Take one tablet 2 hours prior to your CT., Disp: 1 tablet, Rfl: 0   albuterol (VENTOLIN HFA) 108 (90 Base) MCG/ACT inhaler, Inhale into the lungs every 6 (six) hours as needed for wheezing or shortness of breath., Disp: , Rfl:    donepezil (ARICEPT) 10 MG tablet, TAKE 1 TABLET AT BEDTIME, Disp: 90 tablet, Rfl: 0   escitalopram (LEXAPRO) 10 MG tablet, Take 1 tablet (10 mg total) by mouth daily., Disp: 90 tablet, Rfl: 0   ezetimibe (ZETIA) 10 MG tablet, TAKE 1 TABLET DAILY, Disp: 90 tablet, Rfl: 0   losartan-hydrochlorothiazide (HYZAAR) 100-25 MG tablet, Take 1 tablet by mouth daily., Disp: 90  tablet, Rfl: 0   memantine (NAMENDA) 5 MG tablet, Take 1 tablet (5 mg total) by mouth 2 (two) times daily., Disp: 180 tablet, Rfl: 0   rosuvastatin (CRESTOR) 10 MG tablet, Take 1 tablet (10 mg total) by mouth daily., Disp: 90 tablet, Rfl: 0   Tiotropium Bromide Monohydrate (SPIRIVA RESPIMAT) 2.5 MCG/ACT AERS, Inhale 2 puffs into the lungs daily., Disp: 12 g, Rfl: 3  Current Facility-Administered Medications:    0.9 %  sodium chloride infusion, 500 mL, Intravenous, Continuous, Milus Banister, MD   sodium chloride      Physical Exam: Blood pressure 130/88, pulse 64, height '6\' 3"'$  (1.905 m), weight 114.6 kg, SpO2 96 %.    Affect appropriate Chronically ill COPD  HEENT: normal Neck supple with no adenopathy JVP normal no bruits no thyromegaly Lungs clear with no wheezing and good diaphragmatic motion Heart:  S1/S2 no murmur, no rub, gallop or click PMI normal Abdomen: benighn, BS positve, no tenderness, no AAA no bruit.  No HSM or HJR Distal pulses intact with no bruits No edema Neuro non-focal Skin warm and dry No muscular weakness   Labs:   Lab Results  Component Value Date   WBC 5.7 12/13/2020   HGB 16.6 12/13/2020   HCT 49.1 12/13/2020   MCV 90 12/13/2020   PLT 150 12/13/2020   No results for input(s): NA, K, CL, CO2, BUN, CREATININE, CALCIUM, PROT, BILITOT, ALKPHOS, ALT, AST, GLUCOSE in the last 168 hours.  Invalid input(s): LABALBU No results found for: CKTOTAL, CKMB, CKMBINDEX, TROPONINI  Lab Results  Component Value Date   CHOL 135 12/13/2020   CHOL 113 03/15/2020   CHOL 190 11/24/2018   Lab Results  Component Value Date   HDL 44 12/13/2020   HDL 40 03/15/2020   HDL 42 11/24/2018   Lab Results  Component Value Date   LDLCALC 67 12/13/2020   LDLCALC 56 03/15/2020   LDLCALC 102 (H) 11/24/2018   Lab Results  Component Value Date   TRIG 138 12/13/2020   TRIG 87 03/15/2020   TRIG 229 (H) 11/24/2018   Lab Results  Component Value Date   CHOLHDL 3.1  12/13/2020   CHOLHDL 2.8 03/15/2020   CHOLHDL 4.5 11/24/2018   No results found for: LDLDIRECT    Radiology: No results found.  EKG: SR rate 62 RSR'  2019  05/09/2021 SR rate 64 normal    ASSESSMENT AND PLAN:   CAD:  seen on CT moderate in LAD in patient with smoking history and HTN shared decision making favor f/u full cardiac CTA to evaluate.  50 mg lopressor 2 hours before and BMET today Discussed possibility of contrast reaction  COPD:  with ILD component f/u with Dr Chase Caller ventolin inhaler HLD:  on crestor LDL at goal 49 Memory:  on both namenda and aricept  Depression :  continue lexapro  HTN:  continue Hyzaar low sodium diet and weight loss   Cardiac CTA BMET  F/U PRN if normal  Signed: Jenkins Rouge 05/09/2021, 10:46 AM

## 2021-05-09 ENCOUNTER — Other Ambulatory Visit: Payer: Self-pay

## 2021-05-09 ENCOUNTER — Ambulatory Visit (INDEPENDENT_AMBULATORY_CARE_PROVIDER_SITE_OTHER): Payer: BC Managed Care – PPO | Admitting: Cardiovascular Disease

## 2021-05-09 ENCOUNTER — Encounter: Payer: Self-pay | Admitting: Cardiovascular Disease

## 2021-05-09 VITALS — BP 130/88 | HR 64 | Ht 75.0 in | Wt 252.6 lb

## 2021-05-09 DIAGNOSIS — R413 Other amnesia: Secondary | ICD-10-CM | POA: Diagnosis not present

## 2021-05-09 DIAGNOSIS — E782 Mixed hyperlipidemia: Secondary | ICD-10-CM

## 2021-05-09 DIAGNOSIS — J439 Emphysema, unspecified: Secondary | ICD-10-CM

## 2021-05-09 DIAGNOSIS — R072 Precordial pain: Secondary | ICD-10-CM

## 2021-05-09 LAB — BASIC METABOLIC PANEL
BUN/Creatinine Ratio: 13 (ref 9–20)
BUN: 15 mg/dL (ref 6–24)
CO2: 24 mmol/L (ref 20–29)
Calcium: 9.3 mg/dL (ref 8.7–10.2)
Chloride: 99 mmol/L (ref 96–106)
Creatinine, Ser: 1.15 mg/dL (ref 0.76–1.27)
Glucose: 92 mg/dL (ref 65–99)
Potassium: 4.1 mmol/L (ref 3.5–5.2)
Sodium: 138 mmol/L (ref 134–144)
eGFR: 74 mL/min/{1.73_m2} (ref >59)

## 2021-05-09 MED ORDER — METOPROLOL TARTRATE 50 MG PO TABS: ORAL_TABLET | ORAL | 0 refills | Status: DC

## 2021-05-09 NOTE — Patient Instructions (Addendum)
Medication Instructions:   *If you need a refill on your cardiac medications before your next appointment, please call your pharmacy*  Lab Work: Your physician recommends that you lab work today- BMET  If you have labs (blood work) drawn today and your tests are completely normal, you will receive your results only by: MyChart Message (if you have MyChart) OR A paper copy in the mail If you have any lab test that is abnormal or we need to change your treatment, we will call you to review the results.   Testing/Procedures: Your physician has requested that you have cardiac CT. Cardiac computed tomography (CT) is a painless test that uses an x-ray machine to take clear, detailed pictures of your heart. For further information please visit [email or web address]. Please follow instruction sheet as given.  Follow-Up: At [facility], you and your health needs are our priority.  As part of our continuing mission to provide you with exceptional heart care, we have created designated Provider Care Teams.  These Care Teams include your primary Cardiologist (physician) and Advanced Practice Providers (APPs -  Physician Assistants and Nurse Practitioners) who all work together to provide you with the care you need, when you need it.  We recommend signing up for the patient portal called "MyChart".  Sign up information is provided on this After Visit Summary.  MyChart is used to connect with patients for Virtual Visits (Telemedicine).  Patients are able to view lab/test results, encounter notes, upcoming appointments, etc.  Non-urgent messages can be sent to your provider as well.   To learn more about what you can do with MyChart, go to [email or web address].    Your next appointment:   As needed  The format for your next appointment:   In Person  Provider:   You may see Dr. Johnsie Cancel or one of the following Advanced Practice Providers on your designated Care Team:   Cecilie Kicks, NP     Your  cardiac CT will be scheduled at one of the below locations:   [facility] [address] [place], [place] [address] [phone]   If scheduled at [facility], please arrive at the [facility] main entrance (entrance A) of [facility] 30 minutes prior to test start time. Proceed to the [facility] Radiology Department (first floor) to check-in and test prep.  Please follow these instructions carefully (unless otherwise directed):  Hold all erectile dysfunction medications at least 3 days (72 hrs) prior to test.  On the Night Before the Test: Be sure to Drink plenty of water. Do not consume any caffeinated/decaffeinated beverages or chocolate 12 hours prior to your test. Do not take any antihistamines 12 hours prior to your test.  On the Day of the Test: Drink plenty of water until 1 hour prior to the test. Do not eat any food 4 hours prior to the test. You may take your regular medications prior to the test.  Take metoprolol (Lopressor) 50 mg two hours prior to test. HOLD Losartan/Hydrochlorothiazide morning of the test.  After the Test: Drink plenty of water. After receiving IV contrast, you may experience a mild flushed feeling. This is normal. On occasion, you may experience a mild rash up to 24 hours after the test. This is not dangerous. If this occurs, you can take Benadryl 25 mg and increase your fluid intake. If you experience trouble breathing, this can be serious. If it is severe call 911 IMMEDIATELY. If it is mild, please call our  office.  Please allow 2-4 weeks for scheduling of routine cardiac CTs. Some insurance companies require a pre-authorization which may delay scheduling of this test.   For non-scheduling related questions, please contact the cardiac imaging nurse navigator should you have any questions/concerns: Marchia Bond, Cardiac Imaging Nurse Navigator Gordy Clement, Cardiac Imaging Nurse Navigator [facility] Direct Office Dial: [phone]   For scheduling needs, including cancellations and rescheduling, please call [place], [phone].

## 2021-05-14 ENCOUNTER — Telehealth (HOSPITAL_COMMUNITY): Payer: Self-pay | Admitting: *Deleted

## 2021-05-14 NOTE — Telephone Encounter (Signed)
Attempted to call patient regarding upcoming cardiac CT appointment. °Left message on voicemail with name and callback number ° °Freja Faro RN Navigator Cardiac Imaging °[facility] °[phone] Office °[phone] Cell ° °

## 2021-05-14 NOTE — Telephone Encounter (Signed)
Returning patient's call regarding upcoming cardiac imaging study; pt verbalizes understanding of appt date/time, parking situation and where to check in, pre-test NPO status and medications ordered, and verified current allergies; name and call back number provided for further questions should they arise  Gordy Clement RN Navigator Cardiac Imaging [facility] [phone] office [phone] cell   Patient to take '50mg'$  metoprolol tartrate two hours prior to cardiac CT.

## 2021-05-16 ENCOUNTER — Encounter: Payer: BC Managed Care – PPO | Admitting: *Deleted

## 2021-05-16 ENCOUNTER — Ambulatory Visit (HOSPITAL_COMMUNITY)
Admission: RE | Admit: 2021-05-16 | Discharge: 2021-05-16 | Disposition: A | Discharge status: Home / Self Care | Payer: BC Managed Care – PPO | Source: Ambulatory Visit | Attending: Internal Medicine | Admitting: Internal Medicine

## 2021-05-16 ENCOUNTER — Other Ambulatory Visit: Payer: Self-pay | Admitting: Internal Medicine

## 2021-05-16 ENCOUNTER — Other Ambulatory Visit: Payer: Self-pay

## 2021-05-16 ENCOUNTER — Ambulatory Visit (HOSPITAL_COMMUNITY)
Admission: RE | Admit: 2021-05-16 | Discharge: 2021-05-16 | Disposition: A | Discharge status: Home / Self Care | Payer: BC Managed Care – PPO | Source: Ambulatory Visit | Attending: Cardiovascular Disease | Admitting: Cardiovascular Disease

## 2021-05-16 DIAGNOSIS — Z006 Encounter for examination for normal comparison and control in clinical research program: Secondary | ICD-10-CM

## 2021-05-16 DIAGNOSIS — R072 Precordial pain: Secondary | ICD-10-CM | POA: Diagnosis not present

## 2021-05-16 DIAGNOSIS — R931 Abnormal findings on diagnostic imaging of heart and coronary circulation: Secondary | ICD-10-CM | POA: Diagnosis not present

## 2021-05-16 DIAGNOSIS — I2510 Atherosclerotic heart disease of native coronary artery without angina pectoris: Secondary | ICD-10-CM | POA: Diagnosis not present

## 2021-05-16 MED ORDER — IOHEXOL 350 MG/ML SOLN
95.0000 mL | Freq: Once | INTRAVENOUS | Status: AC | PRN
  Administered 2021-05-16: 95 mL via INTRAVENOUS

## 2021-05-16 MED ORDER — NITROGLYCERIN 0.4 MG SL SUBL: 0.8000 mg | SUBLINGUAL_TABLET | Freq: Once | SUBLINGUAL | Status: AC

## 2021-05-16 MED ORDER — NITROGLYCERIN 0.4 MG SL SUBL
SUBLINGUAL_TABLET | SUBLINGUAL | Status: AC
  Administered 2021-05-16: 0.8 mg via SUBLINGUAL
  Filled 2021-05-16: qty 2

## 2021-05-16 NOTE — Research (Signed)
IDENTIFY Informed Consent                  Subject Name:   Richard Moore   Subject met inclusion and exclusion criteria.  The informed consent form, study requirements and expectations were reviewed with the subject and questions and concerns were addressed prior to the signing of the consent form.  The subject verbalized understanding of the trial requirements.  The subject agreed to participate in the IDENTIFY trial and signed the informed consent at 07:48 a.m. on 05-16-2021.  The informed consent was obtained prior to performance of any protocol-specific procedures for the subject.  A copy of the signed informed consent was given to the subject and a copy was placed in the subject's medical record.    , Research Assistant  05/16/2021 07:48 a.m.   

## 2021-05-20 ENCOUNTER — Telehealth: Payer: Self-pay

## 2021-05-20 NOTE — Telephone Encounter (Signed)
-----   Message from Josue Hector, MD sent at 05/17/2021  2:15 PM EDT ----- Non obstructive CAD negative FFR LDL at goal on crestor and zetia f/u with me in 3 months

## 2021-05-20 NOTE — Telephone Encounter (Signed)
Patient verbalized understanding of result. Pt had no questions or concerns at this time.

## 2021-06-06 ENCOUNTER — Ambulatory Visit: Payer: BC Managed Care – PPO | Admitting: Internal Medicine

## 2021-06-23 ENCOUNTER — Other Ambulatory Visit: Payer: Self-pay | Admitting: Nurse Practitioner

## 2021-06-23 DIAGNOSIS — F419 Anxiety disorder, unspecified: Secondary | ICD-10-CM

## 2021-06-23 DIAGNOSIS — R413 Other amnesia: Secondary | ICD-10-CM

## 2021-06-23 DIAGNOSIS — I10 Essential (primary) hypertension: Secondary | ICD-10-CM

## 2021-06-23 DIAGNOSIS — E785 Hyperlipidemia, unspecified: Secondary | ICD-10-CM

## 2021-06-24 NOTE — Telephone Encounter (Signed)
MMM NTBS for 6 mos ckup. Mail order not sent

## 2021-06-25 ENCOUNTER — Ambulatory Visit (INDEPENDENT_AMBULATORY_CARE_PROVIDER_SITE_OTHER): Payer: BC Managed Care – PPO | Admitting: Internal Medicine

## 2021-06-25 ENCOUNTER — Other Ambulatory Visit: Payer: Self-pay

## 2021-06-25 ENCOUNTER — Telehealth: Payer: Self-pay | Admitting: *Deleted

## 2021-06-25 ENCOUNTER — Encounter: Payer: Self-pay | Admitting: Internal Medicine

## 2021-06-25 VITALS — BP 128/82 | HR 60 | Temp 98.2°F | Ht 75.0 in | Wt 255.2 lb

## 2021-06-25 DIAGNOSIS — F419 Anxiety disorder, unspecified: Secondary | ICD-10-CM

## 2021-06-25 DIAGNOSIS — E785 Hyperlipidemia, unspecified: Secondary | ICD-10-CM

## 2021-06-25 DIAGNOSIS — Z87891 Personal history of nicotine dependence: Secondary | ICD-10-CM

## 2021-06-25 DIAGNOSIS — J439 Emphysema, unspecified: Secondary | ICD-10-CM

## 2021-06-25 DIAGNOSIS — R413 Other amnesia: Secondary | ICD-10-CM

## 2021-06-25 DIAGNOSIS — J849 Interstitial pulmonary disease, unspecified: Secondary | ICD-10-CM

## 2021-06-25 DIAGNOSIS — R0609 Other forms of dyspnea: Secondary | ICD-10-CM | POA: Diagnosis not present

## 2021-06-25 DIAGNOSIS — I10 Essential (primary) hypertension: Secondary | ICD-10-CM

## 2021-06-25 MED ORDER — DONEPEZIL HCL 10 MG PO TABS: 10.0000 mg | ORAL_TABLET | Freq: Every day | ORAL | 0 refills | Status: DC

## 2021-06-25 MED ORDER — ALBUTEROL SULFATE HFA 108 (90 BASE) MCG/ACT IN AERS: 2.0000 | INHALATION_SPRAY | Freq: Four times a day (QID) | RESPIRATORY_TRACT | 3 refills | Status: DC | PRN

## 2021-06-25 MED ORDER — MEMANTINE HCL 5 MG PO TABS
5.0000 mg | ORAL_TABLET | Freq: Two times a day (BID) | ORAL | 0 refills | Status: DC
Start: 2021-06-25 — End: 2021-08-11

## 2021-06-25 MED ORDER — ROSUVASTATIN CALCIUM 10 MG PO TABS: 10.0000 mg | ORAL_TABLET | Freq: Every day | ORAL | 0 refills | Status: DC

## 2021-06-25 MED ORDER — ESCITALOPRAM OXALATE 10 MG PO TABS: 10.0000 mg | ORAL_TABLET | Freq: Every day | ORAL | 0 refills | Status: DC

## 2021-06-25 MED ORDER — ANORO ELLIPTA 62.5-25 MCG/INH IN AEPB: 1.0000 | INHALATION_SPRAY | Freq: Every day | RESPIRATORY_TRACT | 0 refills | Status: DC

## 2021-06-25 MED ORDER — EZETIMIBE 10 MG PO TABS: 10.0000 mg | ORAL_TABLET | Freq: Every day | ORAL | 0 refills | Status: DC

## 2021-06-25 MED ORDER — LOSARTAN POTASSIUM-HCTZ 100-25 MG PO TABS
1.0000 | ORAL_TABLET | Freq: Every day | ORAL | 0 refills | Status: DC
Start: 2021-06-25 — End: 2021-08-11

## 2021-06-25 NOTE — Progress Notes (Signed)
Subjective:     Patient ID: Richard Moore, male   DOB: 08-23-1961, 60 y.o.   MRN: [id number]  PCP Chevis Pretty, FNP  HPI  IOV 12/16/2017  Chief Complaint  Patient presents with   Consult    COPD per [facility]. Pt last saw MR 06/22/12.  Pt does have c/o cough sometimes with brown to yellow mucus.    60 year old male who says that in 2013 I took care of him for pneumonia.  Review of the chart and visualization shows that he had CT scan evidence of left lower lobe pneumonia in 2013.  After that he had complete recovery but he says clinically he did not follow-up with me.  Recently went and saw her primary care physician according to his history for left upper back pain along with lower back mid spinal area pain that is chronic.  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  As part of this evaluation for pain he had a chest x-ray that then they reported to him he has COPD [I visualized the chest x-ray from February 2019 and confirmed the presence of hyperinflation and right lower lobe atelectasis] therefore he has been referred here.  He says he that he does not have any symptoms but when I started questioning him again he started admitting to chronic cough for many years.  It happens early in the morning when he brushes the teeth.  His wife notices it.  It does not bother him.  Therefore it is mild to moderate in intensity.  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  Several years ago this was not the case.  There are no other symptoms.  Med review history shows associated lisinopril intake for many years.  Results for JOZSEF, WESCOAT (MRN [id number]) as of 12/16/2017 09:30  Ref. Range 11/05/2017 17:19  Creatinine Latest Ref Range: 0.76 - 1.27 mg/dL 0.94   Results for BIRD, SWETZ (MRN [id number]) as of  12/16/2017 09:30  Ref. Range 11/05/2017 17:19  Hemoglobin Latest Ref Range: 13.0 - 17.7 g/dL 16.1    CT Chest data April 2019  IMPRESSION: 1. Scattered areas of peribronchovascular and subpleural ground-glass, with somewhat of a mid and lower lung zone predominance. Findings are nonspecific and may be post infectious/postinflammatory in etiology when compared with 02/29/2012. Nonspecific interstitial pneumonitis is not excluded. 2. 4 mm subpleural right lower lobe nodule, likely benign. No follow-up needed if patient is low-risk. Non-contrast chest CT can be considered in 12 months if patient is high-risk. This recommendation follows the consensus statement: Guidelines for Management of Incidental Pulmonary Nodules Detected on CT Images: From the Fleischner Society 2017; Radiology 2017; 284:228-243. 3.  Emphysema (ICD10-J43.9). 4. Aortic atherosclerosis (ICD10-170.0). Coronary artery calcification. 5. Tiny right renal stone.     Electronically Signed   By: Lorin Picket M.D.   On: 01/01/2018 08:47     OV 11/07/2020  Subjective:  Patient ID: Richard Moore, male , DOB: [birth year] , age 60 y.o. , MRN: [id number] , ADDRESS: [place] [address] PCP Chevis Pretty, FNP Patient Care Team: Chevis Pretty, FNP as PCP - General (Nurse Practitioner)  This Provider for this visit: Treatment Team:  Attending Provider: Brand Males, MD    11/07/2020 -   Chief Complaint  Patient presents with  Follow-up    Recently had PNA in Jan, still winded easily     HPI Richard Moore 60 y.o. - returns for follow-up.  It is just shy of 3 years since I last saw him.  Therefore technically is an established patient.  He says after he last saw me for shortness of breath and cough [he is on lisinopril] he had CT scan of the chest that showed a 4 mm nodule but also emphysema and plus minus early ILD.  He says he was sent by [employer] to  work in [place].  He was only coming to [place] on the weekends.  Therefore has been unable to keep up with follow-up.  He says he had his Covid vaccine [not documented in our records].  He has had 2 shots of this.  He says that over the last 3 years he has had insidious worsening of shortness of breath with exertion relieved by rest.  He also has cough.  There is no associated chest pain orthopnea proximal nocturnal dyspnea.  He could be snoring.  Symptoms suggest progressive.  He is desperate about the severity of symptoms.  Mid January 2022 he had bronchitis symptoms which he described.  He went to an urgent care and was told he had double pneumonia.  But he says he was never Covid tested.  He never had a chest x-ray but was given antibiotics for "double pneumonia".  He says this is set him back.   CXR Feb 2019  Narrative & Impression  CLINICAL DATA:  Chest pain and cough.   EXAM: CHEST  2 VIEW   COMPARISON:  Chest radiograph June 30, 2013   FINDINGS: Cardiomediastinal silhouette is normal. No pleural effusions or focal consolidations. Similar mild interstitial prominence. Strandy densities RIGHT lung base. Trachea projects midline and there is no pneumothorax. Soft tissue planes and included osseous structures are non-suspicious.   IMPRESSION: Mild suspected COPD with RIGHT lung base atelectasis.     Electronically Signed   By: Elon Alas M.D.   On: 11/06/2017 01:43      .   11/29/2020 Follow up ; COPD, chronic cough, abnormal CT chest and right lung nodule Patient returns for a 1 month follow-up.  Patient was seen last visit for a follow-up with complaints of shortness of breath and cough lingering for 1 month. Sick in January 2022, dx with Pneumonia . Treated with antibiotics and steroids . Got better but cough and dyspnea persisted. Patient was set up for multiple tests and lab work.  Last visit labs showed a negative D-dimer, BNP.  Mildly elevated LFT with ALT  at 54.  Normal CBC with eosinophils at absolute count 100.  Platelets were slightly low at 128,000. High-resolution CT chest and 2D echo are pending. Covid IGG was very high indicative of recent infection.  He was started on Spiriva last visit but did not get this at the pharmacy.  We called the pharmacy but prescription was not sent .  Since last visit patient says he is feeling about the same.  Gets short of breath with activities.  Has a daily chronic cough.  Patient is fully vaccinated for COVID-19.  Of note patient is on a ACE inhibitor. He denies any fever or discolored mucus chest pain orthopnea PND or increased leg swelling. PFTs today show mild restriction with an FEV1 at 83%, ratio 83, FVC 76%.  No significant bronchodilator response, DLCO 85%.  Plan Begin Spiriva 2 puffs daily .  Activity as tolerated Once CT and Echo are resulted,results will be called.  Discussed with primary care provider that Zestoretic (lisinopril) could be making your cough worse, may need an alternative if able Follow-up with Dr. Chase Caller in 6 weeks and as needed Please contact office for sooner follow up if symptoms do not improve or worsen or seek emergency care    OV 02/07/2021  Subjective:  Patient ID: Richard Moore, male , DOB: October 18, 1960 , age 59 y.o. , MRN: [id number] , ADDRESS: [place] [address] PCP Chevis Pretty, FNP Patient Care Team: Chevis Pretty, FNP as PCP - General (Nurse Practitioner)  This Provider for this visit: Treatment Team:  Attending Provider: Brand Males, MD    02/07/2021 -   Chief Complaint  Patient presents with   Follow-up    2 mo f/u. States he developed PNA again back in April 2022.    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 -presents for follow-up.  Last seen by myself in February 2022.  He then  followed up with nurse practitioner in March 2022 and was started on Spiriva.  After that he had high-resolution CT chest.  I personally visualized this.  He has some basilar postinflammatory ILD changes.  This is mild.  It is unchanged compared to 2019.  I suspect this is postinflammatory ILD.  His pulmonary function test shows restriction with lower diffusion reflecting combination of emphysema and ILD.  On walking desaturation test today he did not desaturate.  He was started on Spiriva and this is helping.  In April 2022 he had pneumonia according to him but he describes to me as COPD exacerbation symptoms.  Resolved after 2 rounds of antibiotics and 1 round of prednisone.  He says his x-ray showed pneumonia I do not have that x-ray with [place].  The x-ray was done after the CT scan.  So the CT scan does not show any pneumonia.  His main complaint is that he is very short of breath particularly walking upstairs and at work.  Sometimes there is associated exertional chest pain.  He does have coronary artery calcification on the CT.  His echo was normal but he is never had a cardiac stress test seen a cardiologist.  His symptoms definitely seem out of proportion to his walking desaturation test.    HRCT 11/29/20  Narrative & Impression  CLINICAL DATA:  60 year old male with history of shortness of breath. Chronic cough.   EXAM: CT CHEST WITHOUT CONTRAST   TECHNIQUE: Multidetector CT imaging of the chest was performed following the standard protocol without intravenous contrast. High resolution imaging of the lungs, as well as inspiratory and expiratory imaging, was performed.   COMPARISON:  High-resolution chest CT 12/31/2017.   FINDINGS: Cardiovascular: Heart size is normal. There is no significant pericardial fluid, thickening or pericardial calcification. There is aortic atherosclerosis, as well as atherosclerosis of the great vessels of the mediastinum and the coronary arteries,  including calcified atherosclerotic plaque in the left anterior descending and left circumflex coronary arteries.   Mediastinum/Nodes: No pathologically enlarged mediastinal or hilar lymph nodes. Please note that accurate exclusion of hilar adenopathy is limited on noncontrast CT scans. Esophagus is unremarkable in appearance. No axillary lymphadenopathy.   Lungs/Pleura: High-resolution images again demonstrate some patchy areas of peripheral predominant ground-glass attenuation, septal thickening, mild cylindrical bronchiectasis and peripheral bronchiolectasis. These findings have no definitive craniocaudal gradient.  No definitive honeycombing confidently identified. Inspiratory and expiratory imaging demonstrates some mild air trapping indicative of mild small airways disease. Mild centrilobular and paraseptal emphysema again noted. No acute consolidative airspace disease. No pleural effusions. No suspicious appearing pulmonary nodules or masses are noted. Small calcified granuloma in the periphery of the right upper lobe.   Upper Abdomen: Diffuse low attenuation throughout the visualized hepatic parenchyma, indicative of hepatic steatosis.   Musculoskeletal: There are no aggressive appearing lytic or blastic lesions noted in the visualized portions of the skeleton.   IMPRESSION: 1. The appearance of the lungs is compatible with interstitial lung disease, with a spectrum of findings considered most compatible with an alternative diagnosis (not usual interstitial pneumonia) per current ATS guidelines. Overall, findings have been stable compared to prior study from 2019 and are favored to reflect mild nonspecific interstitial pneumonia (NSIP) in conjunction with a background of mild centrilobular and paraseptal emphysema. 2. Aortic atherosclerosis, in addition to 2 vessel coronary artery disease. Please note that although the presence of coronary artery calcium documents the  presence of coronary artery disease, the severity of this disease and any potential stenosis cannot be assessed on this non-gated CT examination. Assessment for potential risk factor modification, dietary therapy or pharmacologic therapy may be warranted, if clinically indicated. 3. Hepatic steatosis.   Aortic Atherosclerosis (ICD10-I70.0) and Emphysema (ICD10-J43.9).     Electronically Signed   By: Vinnie Langton M.D.   On: 11/29/2020 12:05      ECHO 11/29/20   IMPRESSIONS     1. Left ventricular ejection fraction, by estimation, is 60 to 65%. The  left ventricle has normal function. The left ventricle has no regional  wall motion abnormalities. Left ventricular diastolic parameters were  normal. The average left ventricular  global longitudinal strain is 17.6 %. The global longitudinal strain is  normal.   2. Right ventricular systolic function is normal. The right ventricular  size is normal. There is normal pulmonary artery systolic pressure.   3. The mitral valve is normal in structure. Trivial mitral valve  regurgitation. No evidence of mitral stenosis.   4. The aortic valve is tricuspid. Aortic valve regurgitation is not  visualized. No aortic stenosis is present.   5. Aortic dilatation noted. There is borderline dilatation of the  ascending aorta, measuring 38 mm.   6. The inferior vena cava is normal in size with greater than 50%  respiratory variability, suggesting right atrial pressure of 3 mmHg.     OV 06/25/2021  Subjective:  Patient ID: Richard Moore, male , DOB: Feb 24, 1961 , age 62 y.o. , MRN: [id number] , ADDRESS: [place] [id number] PCP Chevis Pretty, FNP Patient Care Team: Chevis Pretty, [place] as PCP - General (Nurse Practitioner)  This Provider for this visit: Treatment Team:  Attending Provider: Brand Males, MD    06/25/2021 -   Chief Complaint  Patient presents with   Follow-up    Pt states he  has been doing good since last visit and denies any complaints.   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 -returns for follow-up.  He has dyspnea due to obesity and pulmonary issues of mild emphysema and postinflammatory ILD.  He had coronary artery calcification.  He saw Dr. Collier Salina admission.  He has been deemed to have nonobstructive coronary  artery disease.  I reviewed the notes.  At this point in time his dyspnea still persist.  His dyspnea score is 14.  See below.  It is some improved after Spiriva but he still frustrated by it.  He says he might be getting anxious.Marland Kitchen  His anxiety score is 5.  He has never been to pulmonary rehabilitation.  He is working overtime.  He has difficulty attending pulmonary rehabilitation.  He thinks tuberculin by Christmas.  He is willing to attend pulmonary rehabilitation in January 2023.  We discussed about upgrading his Spiriva which has been helping him to combination Stiolto or Anoro.  He is willing to try this.  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  I have told him about this.  There is no family history of COPD but I advised him to get his family members checked out.  He declined a flu shot.  He has obesity and we discussed weight loss options.  Does not have diabetes.  Advised him a low glycemic diet with mild calorie restriction.  He is willing to try this.      SYMPTOM SCALE - ILD 02/07/2021  06/25/2021   O2 use ra ra  Shortness of Breath 0 -> 5 scale with 5 being worst (score 6 If unable to do)   At rest 2 0  Simple tasks - showers, clothes change, eating, shaving 3 2  Household (dishes, doing bed, laundry) 3 2  Shopping 3 2  Walking level at own pace 3 3  Walking up Stairs 5 5  Total (30-36) Dyspnea Score 19 14  How bad is your cough? 2 1  How bad is your  fatigue 4 5  How bad is nausea 0 0  How bad is vomiting?  0 0  How bad is diarrhea? 4 0  How bad is anxiety? 3 5  How bad is depression 1 0      Simple office walk 185 feet x  3 laps goal with forehead probe 02/07/2021   O2 used ra  Number laps completed 3  Comments about pace x  Resting Pulse Ox/HR 98% and 65/min  Final Pulse Ox/HR 99% and 86/min  Desaturated </= 88% no  Desaturated <= 3% points no  Got Tachycardic >/= 90/min no  Symptoms at end of test Dyspnea t end of 2nd lap  Miscellaneous comments x    PFT  PFT Results Latest Ref Rng & Units 11/29/2020  FVC-Pre L 4.38  FVC-Predicted Pre % 74  FVC-Post L 4.50  FVC-Predicted Post % 76  Pre FEV1/FVC % % 82  Post FEV1/FCV % % 83  FEV1-Pre L 3.60  FEV1-Predicted Pre % 80  FEV1-Post L 3.75  DLCO uncorrected ml/min/mmHg 28.44  DLCO UNC% % 85  DLCO corrected ml/min/mmHg 27.35  DLCO COR %Predicted % 81  DLVA Predicted % 105  TLC L 6.17  TLC % Predicted % 75  RV % Predicted % 70       has a past medical history of COPD (chronic obstructive pulmonary disease) (HCC), GERD (gastroesophageal reflux disease), Hyperlipidemia, Hypertension, Pneumonia, and Sleep apnea.   reports that he quit smoking about 13 years ago. His smoking use included cigarettes. He has a 30.00 pack-year smoking history. He has never used smokeless tobacco.  Past Surgical History:  Procedure Laterality Date   COLONOSCOPY     INGUINAL HERNIA REPAIR     right   VASECTOMY      No Known Allergies   There is no immunization  history on file for this patient.  Family History  Problem Relation Age of Onset   Lung cancer Father        23 years ago   Colon cancer Neg Hx    Esophageal cancer Neg Hx    Rectal cancer Neg Hx    Stomach cancer Neg Hx      Current Outpatient Medications:    albuterol (VENTOLIN HFA) 108 (90 Base) MCG/ACT inhaler, Inhale 2 puffs into the lungs every 6 (six) hours as needed for wheezing or shortness of breath.,  Disp: 18 g, Rfl: 3   donepezil (ARICEPT) 10 MG tablet, Take 1 tablet (10 mg total) by mouth at bedtime., Disp: 90 tablet, Rfl: 0   escitalopram (LEXAPRO) 10 MG tablet, Take 1 tablet (10 mg total) by mouth daily., Disp: 90 tablet, Rfl: 0   ezetimibe (ZETIA) 10 MG tablet, Take 1 tablet (10 mg total) by mouth daily., Disp: 90 tablet, Rfl: 0   losartan-hydrochlorothiazide (HYZAAR) 100-25 MG tablet, Take 1 tablet by mouth daily., Disp: 90 tablet, Rfl: 0   memantine (NAMENDA) 5 MG tablet, Take 1 tablet (5 mg total) by mouth 2 (two) times daily., Disp: 180 tablet, Rfl: 0   metoprolol tartrate (LOPRESSOR) 50 MG tablet, Take one tablet 2 hours prior to your CT., Disp: 1 tablet, Rfl: 0   rosuvastatin (CRESTOR) 10 MG tablet, Take 1 tablet (10 mg total) by mouth daily., Disp: 90 tablet, Rfl: 0   umeclidinium-vilanterol (ANORO ELLIPTA) 62.5-25 MCG/INH AEPB, Inhale 1 puff into the lungs daily., Disp: 7 each, Rfl: 0  Current Facility-Administered Medications:    0.9 %  sodium chloride infusion, 500 mL, Intravenous, Continuous, Milus Banister, MD      Objective:   Vitals:   06/25/21 1004  BP: 128/82  Pulse: 60  Temp: 98.2 F (36.8 C)  TempSrc: Oral  SpO2: 98%  Weight: 255 lb 3.2 oz (115.8 kg)  Height: 6\' 3"  (1.905 m)    Estimated body mass index is 31.9 kg/m as calculated from the following:   Height as of this encounter: 6\' 3"  (1.905 m).   Weight as of this encounter: 255 lb 3.2 oz (115.8 kg).  @WEIGHTCHANGE @  [employer]   06/25/21 1004  Weight: 255 lb 3.2 oz (115.8 kg)     Physical Exam   General: No distress. obse Neuro: Alert and Oriented x 3. GCS 15. Speech normal Psych: Pleasant Resp:  Barrel Chest - no.  Wheeze - no, Crackles - no, No overt respiratory distress CVS: Normal heart sounds. Murmurs - no Ext: Stigmata of Connective Tissue Disease - no HEENT: Normal upper airway. PEERL +. No post nasal drip        Assessment:       ICD-10-CM   1. Dyspnea on exertion   R06.09     2. Pulmonary emphysema, unspecified emphysema type ([place])  J43.9     3. ILD (interstitial lung disease) ([place])  J84.9     4. Stopped smoking with greater than 30 pack year history  Z87.891          Plan:     Patient Instructions  Dyspnea on exertion  - due to weight and lung issues - glad cardica issues rule out  Plan  - refer pulmonary rehab but  tell them you wil start in Jan 2023 - start [facility] low glycemic diet sheet with mild calorie restriction   - stick to foods in left lane  - follow advice below -address anxiety  after pulm rehab - hopefully rehab helps   Pulmonary emphysema, unspecified emphysema type ([place]) Alpha 1 MS phenotyple  - stable   Plan  - change spiriva to ANORO  1 puff daily to see if this can help your shortness of breath - refill alb as needed - have blood family members check alpha 1 AT phenotyple - repect flu shot deferral  ILD (interstitial lung disease) ([place])  - this is mild and post inflamatory from 2013 pneumonia. Stable between CT 2019 =-> 2022 - doubt wil progress but got to keep an eye  Plan  - PFT in 6 months (last March 2022)   Followup - 6 months or sooner if needed  - ILD symp score and walk test [facility]    SIGNATURE    Dr. Brand Males, M.D., F.C.C.P,  Pulmonary and Critical Care Medicine Staff Physician, [place] Director - Interstitial Lung Disease  Program  Pulmonary [place] [facility] [place], [place], [address]  Pager: [phone], If no answer or between  15:00h - 7:00h: call [phone] Telephone: [phone]  10:36 AM 06/25/2021

## 2021-06-25 NOTE — Patient Instructions (Addendum)
Dyspnea on exertion  - due to weight and lung issues - glad cardica issues rule out  Plan  - refer pulmonary rehab but  tell them you wil start in Jan 2023 - start [facility] low glycemic diet sheet with mild calorie restriction   - stick to foods in left lane  - follow advice below -address anxiety after pulm rehab - hopefully rehab helps   Pulmonary emphysema, unspecified emphysema type ([place]) Alpha 1 MS phenotyple  - stable   Plan  - change spiriva to ANORO  1 puff daily to see if this can help your shortness of breath - refill alb as needed - have blood family members check alpha 1 AT phenotyple - repect flu shot deferral  ILD (interstitial lung disease) ([place])  - this is mild and post inflamatory from 2013 pneumonia. Stable between CT 2019 =-> 2022 - doubt wil progress but got to keep an eye  Plan  - PFT in 6 months (last March 2022)   Followup - 6 months or sooner if needed  - ILD symp score and walk test at [facility]

## 2021-06-25 NOTE — Telephone Encounter (Signed)
Med refills to mail order, Appt was made for 07/28/21. Refills sent

## 2021-07-28 ENCOUNTER — Ambulatory Visit: Payer: BC Managed Care – PPO | Admitting: Nurse Practitioner

## 2021-08-11 ENCOUNTER — Ambulatory Visit (INDEPENDENT_AMBULATORY_CARE_PROVIDER_SITE_OTHER): Payer: BC Managed Care – PPO | Admitting: Nurse Practitioner

## 2021-08-11 ENCOUNTER — Encounter: Payer: Self-pay | Admitting: Nurse Practitioner

## 2021-08-11 VITALS — BP 136/86 | HR 57 | Temp 98.4°F | Resp 20 | Ht 75.0 in | Wt 246.0 lb

## 2021-08-11 DIAGNOSIS — I739 Peripheral vascular disease, unspecified: Secondary | ICD-10-CM

## 2021-08-11 DIAGNOSIS — I10 Essential (primary) hypertension: Secondary | ICD-10-CM

## 2021-08-11 DIAGNOSIS — F419 Anxiety disorder, unspecified: Secondary | ICD-10-CM | POA: Diagnosis not present

## 2021-08-11 DIAGNOSIS — E785 Hyperlipidemia, unspecified: Secondary | ICD-10-CM | POA: Diagnosis not present

## 2021-08-11 DIAGNOSIS — Z125 Encounter for screening for malignant neoplasm of prostate: Secondary | ICD-10-CM

## 2021-08-11 DIAGNOSIS — R413 Other amnesia: Secondary | ICD-10-CM

## 2021-08-11 DIAGNOSIS — J439 Emphysema, unspecified: Secondary | ICD-10-CM

## 2021-08-11 DIAGNOSIS — Z6830 Body mass index (BMI) 30.0-30.9, adult: Secondary | ICD-10-CM

## 2021-08-11 MED ORDER — EZETIMIBE 10 MG PO TABS: 10.0000 mg | ORAL_TABLET | Freq: Every day | ORAL | 1 refills | Status: DC

## 2021-08-11 MED ORDER — MEMANTINE HCL 5 MG PO TABS: 5.0000 mg | ORAL_TABLET | Freq: Two times a day (BID) | ORAL | 1 refills | Status: DC

## 2021-08-11 MED ORDER — DONEPEZIL HCL 10 MG PO TABS: 10.0000 mg | ORAL_TABLET | Freq: Every day | ORAL | 1 refills | Status: DC

## 2021-08-11 MED ORDER — ESCITALOPRAM OXALATE 10 MG PO TABS: 10.0000 mg | ORAL_TABLET | Freq: Every day | ORAL | 1 refills | Status: DC

## 2021-08-11 MED ORDER — LOSARTAN POTASSIUM-HCTZ 100-25 MG PO TABS: 1.0000 | ORAL_TABLET | Freq: Every day | ORAL | 1 refills | Status: DC

## 2021-08-11 MED ORDER — ROSUVASTATIN CALCIUM 10 MG PO TABS: 10.0000 mg | ORAL_TABLET | Freq: Every day | ORAL | 1 refills | Status: DC

## 2021-08-11 NOTE — Patient Instructions (Signed)

## 2021-08-11 NOTE — Addendum Note (Signed)
Addended by: Chevis Pretty on: 08/11/2021 11:54 AM   Modules accepted: Orders

## 2021-08-11 NOTE — Progress Notes (Addendum)
Subjective:    Patient ID: Richard Moore, male    DOB: 1961-01-15, 60 y.o.   MRN: [id number]   Chief Complaint: Medical Management of Chronic Issues    HPI:  1. Essential hypertension No c/o chest pain, sob or headache. Does not check blood pressure at home. BP Readings from Last 3 Encounters:  08/11/21 136/86  06/25/21 128/82  05/16/21 112/82     2. Hyperlipidemia with target LDL less than 100 Does try to watch diet but does no dedicated exercise. Lab Results  Component Value Date   CHOL 135 12/13/2020   HDL 44 12/13/2020   LDLCALC 67 12/13/2020   TRIG 138 12/13/2020   CHOLHDL 3.1 12/13/2020     3. Pulmonary emphysema, unspecified emphysema type ([place]) Has occasional SOB but is sporatic. He is now on anoro and albuterol. Says he has follow up in 3 months he thinks.  4. Anxiety Is on lexapro. Anxiety level has been up and down. GAD 7 : Generalized Anxiety Score 08/11/2021 12/13/2020  Nervous, Anxious, on Edge 0 2  Control/stop worrying 0 1  Worry too much - different things 0 1  Trouble relaxing 3 1  Restless 0 0  Easily annoyed or irritable 3 2  Afraid - awful might happen 0 3  Total GAD 7 Score 6 10  Anxiety Difficulty Somewhat difficult Not difficult at all      5. 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 Memory is maintaining.  6. BMI 30.0-30.9,adult No recent weight changes Wt Readings from Last 3 Encounters:  08/11/21 246 lb (111.6 kg)  06/25/21 255 lb 3.2 oz (115.8 kg)  05/09/21 252 lb 9.6 oz (114.6 kg)   BMI Readings from Last 3 Encounters:  08/11/21 30.75 kg/m  06/25/21 31.90 kg/m  05/09/21 31.57 kg/m       Outpatient Encounter Medications as of 08/11/2021  Medication Sig   albuterol (VENTOLIN HFA) 108 (90 Base) MCG/ACT inhaler Inhale 2 puffs into the lungs every 6 (six) hours as needed for wheezing or shortness of breath.   donepezil (ARICEPT) 10 MG tablet Take 1 tablet (10 mg total) by mouth at bedtime.    escitalopram (LEXAPRO) 10 MG tablet Take 1 tablet (10 mg total) by mouth daily.   ezetimibe (ZETIA) 10 MG tablet Take 1 tablet (10 mg total) by mouth daily.   losartan-hydrochlorothiazide (HYZAAR) 100-25 MG tablet Take 1 tablet by mouth daily.   memantine (NAMENDA) 5 MG tablet Take 1 tablet (5 mg total) by mouth 2 (two) times daily.   metoprolol tartrate (LOPRESSOR) 50 MG tablet Take one tablet 2 hours prior to your CT.   rosuvastatin (CRESTOR) 10 MG tablet Take 1 tablet (10 mg total) by mouth daily.   umeclidinium-vilanterol (ANORO ELLIPTA) 62.5-25 MCG/INH AEPB Inhale 1 puff into the lungs daily.   Facility-Administered Encounter Medications as of 08/11/2021  Medication   0.9 %  sodium chloride infusion    Past Surgical History:  Procedure Laterality Date   COLONOSCOPY     INGUINAL HERNIA REPAIR     right   VASECTOMY      Family History  Problem Relation Age of Onset   Lung cancer Father        23 years ago   Colon cancer Neg Hx    Esophageal cancer Neg Hx    Rectal cancer Neg Hx    Stomach cancer Neg Hx     New complaints: Feet are turning blue and get numb  at times.  Social history: Lives with his wife. Works out of town a lot.  Controlled substance contract: n/a     Review of Systems  Constitutional:  Negative for diaphoresis.  Eyes:  Negative for pain.  Respiratory:  Negative for shortness of breath.   Cardiovascular:  Negative for chest pain, palpitations and leg swelling.  Gastrointestinal:  Negative for abdominal pain.  Endocrine: Negative for polydipsia.  Skin:  Negative for rash.  Neurological:  Negative for dizziness, weakness and headaches.  Hematological:  Does not bruise/bleed easily.  All other systems reviewed and are negative.     Objective:   Physical Exam Vitals and nursing note reviewed.  Constitutional:      Appearance: Normal appearance. He is well-developed.  HENT:     Head: Normocephalic.     Nose: Nose normal.  Eyes:      Pupils: Pupils are equal, round, and reactive to light.  Neck:     Thyroid: No thyroid mass or thyromegaly.     Vascular: No carotid bruit or JVD.     Trachea: Phonation normal.  Cardiovascular:     Rate and Rhythm: Normal rate and regular rhythm.     Pulses:          Carotid pulses are 2+ on the right side and 2+ on the left side.      Radial pulses are 2+ on the right side and 2+ on the left side.       Femoral pulses are 2+ on the right side and 2+ on the left side.      Popliteal pulses are 1+ on the right side and 1+ on the left side.       Dorsalis pedis pulses are 1+ on the right side and 1+ on the left side.  Pulmonary:     Effort: Pulmonary effort is normal. No respiratory distress.     Breath sounds: Normal breath sounds.  Abdominal:     General: Bowel sounds are normal.     Palpations: Abdomen is soft.     Tenderness: There is no abdominal tenderness.  Musculoskeletal:        General: Normal range of motion.     Cervical back: Normal range of motion and neck supple.  Lymphadenopathy:     Cervical: No cervical adenopathy.  Skin:    General: Skin is warm and dry.     Comments: feet cold to touch  Neurological:     Mental Status: He is alert and oriented to person, place, and time.  Psychiatric:        Behavior: Behavior normal.        Thought Content: Thought content normal.        Judgment: Judgment normal.   BP 136/86   Pulse (!) 57   Temp 98.4 F (36.9 C) (Temporal)   Resp 20   Ht 6' 3"  (1.905 m)   Wt 246 lb (111.6 kg)   SpO2 98%   BMI 30.75 kg/m         Assessment & Plan:  Richard Moore comes in today with chief complaint of Medical Management of Chronic Issues   Diagnosis and orders addressed:  1.  Primary hypertension Low sodium diet - losartan-hydrochlorothiazide (HYZAAR) 100-25 MG tablet; Take 1 tablet by mouth daily.  Dispense: 90 tablet; Refill: 1   2. Hyperlipidemia with target LDL less than 100 Low fat diet - rosuvastatin  (CRESTOR) 10 MG tablet; Take 1 tablet (10 mg total) by  mouth daily.  Dispense: 90 tablet; Refill: 1 - ezetimibe (ZETIA) 10 MG tablet; Take 1 tablet (10 mg total) by mouth daily.  Dispense: 90 tablet; Refill: 1  3. Pulmonary emphysema, unspecified emphysema type ([place]) Keep follow up with pulmonology  4. Anxiety Stress management - escitalopram (LEXAPRO) 10 MG tablet; Take 1 tablet (10 mg total) by mouth daily.  Dispense: 90 tablet; Refill: 1  5. Memory disturbance Brain exercises daily- read, word puzzles - memantine (NAMENDA) 5 MG tablet; Take 1 tablet (5 mg total) by mouth 2 (two) times daily.  Dispense: 180 tablet; Refill: 1 - donepezil (ARICEPT) 10 MG tablet; Take 1 tablet (10 mg total) by mouth at bedtime.  Dispense: 90 tablet; Refill: 1  6. BMI 30.0-30.9,adult Discussed diet and exercise for person with BMI >25 Will recheck weight in 3-6 months  Orders Placed This Encounter  Procedures   CBC with Differential/Platelet   CMP14+EGFR   Lipid panel   PSA, total and free   7.. PVD Referral to vascular  Compression socks when up walking Elevate legs when sitting  Labs pending Health Maintenance reviewed Diet and exercise encouraged  Follow up plan: 6 months   Cockrell Hill, FNP

## 2021-08-11 NOTE — Addendum Note (Signed)
Addended by: Chevis Pretty on: 08/11/2021 09:14 AM   Modules accepted: Orders

## 2021-08-12 LAB — CMP14+EGFR
ALT: 49 IU/L — ABNORMAL HIGH (ref 0–44)
AST: 29 IU/L (ref 0–40)
Albumin/Globulin Ratio: 2.4 — ABNORMAL HIGH (ref 1.2–2.2)
Albumin: 4.6 g/dL (ref 3.8–4.9)
Alkaline Phosphatase: 40 IU/L — ABNORMAL LOW (ref 44–121)
BUN/Creatinine Ratio: 14 (ref 9–20)
BUN: 11 mg/dL (ref 6–24)
Bilirubin Total: 0.7 mg/dL (ref 0.0–1.2)
CO2: 24 mmol/L (ref 20–29)
Calcium: 9 mg/dL (ref 8.7–10.2)
Chloride: 104 mmol/L (ref 96–106)
Creatinine, Ser: 0.8 mg/dL (ref 0.76–1.27)
Globulin, Total: 1.9 g/dL (ref 1.5–4.5)
Glucose: 105 mg/dL — ABNORMAL HIGH (ref 70–99)
Potassium: 4.2 mmol/L (ref 3.5–5.2)
Sodium: 140 mmol/L (ref 134–144)
Total Protein: 6.5 g/dL (ref 6.0–8.5)
eGFR: 102 mL/min/{1.73_m2} (ref >59)

## 2021-08-12 LAB — PSA, TOTAL AND FREE
PSA, Free Pct: 27.5 %
PSA, Free: 0.11 ng/mL
Prostate Specific Ag, Serum: 0.4 ng/mL (ref 0.0–4.0)

## 2021-08-12 LAB — CBC WITH DIFFERENTIAL/PLATELET
Basophils Absolute: 0 10*3/uL (ref 0.0–0.2)
Basos: 1 %
EOS (ABSOLUTE): 0.1 10*3/uL (ref 0.0–0.4)
Eos: 2 %
Hematocrit: 45.8 % (ref 37.5–51.0)
Hemoglobin: 15.3 g/dL (ref 13.0–17.7)
Immature Grans (Abs): 0 10*3/uL (ref 0.0–0.1)
Immature Granulocytes: 0 %
Lymphocytes Absolute: 1.7 10*3/uL (ref 0.7–3.1)
Lymphs: 38 %
MCH: 30.7 pg (ref 26.6–33.0)
MCHC: 33.4 g/dL (ref 31.5–35.7)
MCV: 92 fL (ref 79–97)
Monocytes Absolute: 0.5 10*3/uL (ref 0.1–0.9)
Monocytes: 10 %
Neutrophils Absolute: 2.2 10*3/uL (ref 1.4–7.0)
Neutrophils: 49 %
Platelets: 149 10*3/uL — ABNORMAL LOW (ref 150–450)
RBC: 4.99 x10E6/uL (ref 4.14–5.80)
RDW: 12.9 % (ref 11.6–15.4)
WBC: 4.5 10*3/uL (ref 3.4–10.8)

## 2021-08-12 LAB — LIPID PANEL
Chol/HDL Ratio: 3.9 ratio (ref 0.0–5.0)
Cholesterol, Total: 150 mg/dL (ref 100–199)
HDL: 38 mg/dL — ABNORMAL LOW (ref >39)
LDL Chol Calc (NIH): 84 mg/dL (ref 0–99)
Triglycerides: 159 mg/dL — ABNORMAL HIGH (ref 0–149)
VLDL Cholesterol Cal: 28 mg/dL (ref 5–40)

## 2021-08-18 NOTE — Progress Notes (Signed)
CARDIOLOGY CONSULT NOTE       Patient ID: Richard Moore MRN: [id number] DOB/AGE: 03/28/1961 60 y.o.  Admit date: (Not on file) Referring Physician: Hassell Done Primary Physician: Chevis Pretty, FNP Primary Cardiologist: New Reason for Consultation: CAD  Active Problems:   * No active hospital problems. *   HPI:  59 y.o. referred by Dr Hassell Done on 05/09/21 for chest pain and calcium seen on CT scan History of COPD, GERD, HLD, HTN, OSA with oral appliance use Sees Dr Chase Caller for pulmonary with chronic cough and some exertional dyspnea CT in 2019 noted nonspecific pneumonitis and also commented on aortic atherosclerosis and coronary calcification ? Early ILD and 4 mm nodule PFTls with FEV183% mild restriction no dilator response and DLCO 85% Quit smoking about 13 years ago   TTE done 11/29/20 EF 53-29% normal diastolic parameters normal RV no significant valve disease or pulmonary HTN and aortic root 3.8 cm   CT 11/29/20 showed moderate calcification of the mid/distal LAD and mild calcification in the proximal circumflex  Cardiac CTA reviewed 05/16/21 calcium score 802 96 th percentile Left dominant Cors tightest lesion D3/IOM  50-69% FFR CT with normal values D3 0.84 and OM2 0.86  His wife is Camera operator on [employer] on weekends Two older children and one 93 yo at home 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 18 acres in [place] and like to be outside  After lengthy talk turns out he went to [employer] with my wife at [employer]  Having significant dysphagia and should see GI for endoscopy  ROS All other systems reviewed and negative except as noted above  Past Medical History:  Diagnosis Date   COPD (chronic obstructive pulmonary disease) ([facility])    GERD (gastroesophageal reflux disease)    Hyperlipidemia    Hypertension    Pneumonia    Sleep apnea    doesn't wear CPAP- uses mouth piece now    Family History  Problem Relation Age of Onset   Lung cancer Father        23 years  ago   Colon cancer Neg Hx    Esophageal cancer Neg Hx    Rectal cancer Neg Hx    Stomach cancer Neg Hx     Social History   Socioeconomic History   Marital status: Married    Spouse name: Not on file   Number of children: Not on file   Years of education: Not on file   Highest education level: Not on file  Occupational History   Occupation: put in IT consultant    Employer: [employer]  Tobacco Use   Smoking status: Former    Packs/day: 2.00    Years: 15.00    Pack years: 30.00    Types: Cigarettes    Quit date: 09/15/2007    Years since quitting: 13.9   Smokeless tobacco: Never  Vaping Use   Vaping Use: Never used  Substance and Sexual Activity   Alcohol use: Yes    Alcohol/week: 6.0 standard drinks    Types: 6 Standard drinks or equivalent per week    Comment: occasionally   Drug use: No   Sexual activity: Not on file  Other Topics Concern   Not on file  Social History Narrative   Not on file   Social Determinants of Health   Financial Resource Strain: Not on file  Food Insecurity: Not on file  Transportation Needs: Not on file  Physical Activity: Not on file  Stress: Not on  file  Social Connections: Not on file  Intimate Partner Violence: Not on file    Past Surgical History:  Procedure Laterality Date   COLONOSCOPY     INGUINAL HERNIA REPAIR     right   VASECTOMY        Current Outpatient Medications:    albuterol (VENTOLIN HFA) 108 (90 Base) MCG/ACT inhaler, Inhale 2 puffs into the lungs every 6 (six) hours as needed for wheezing or shortness of breath., Disp: 18 g, Rfl: 3   donepezil (ARICEPT) 10 MG tablet, Take 1 tablet (10 mg total) by mouth at bedtime., Disp: 90 tablet, Rfl: 1   escitalopram (LEXAPRO) 10 MG tablet, Take 1 tablet (10 mg total) by mouth daily., Disp: 90 tablet, Rfl: 1   ezetimibe (ZETIA) 10 MG tablet, Take 1 tablet (10 mg total) by mouth daily., Disp: 90 tablet, Rfl: 1   losartan-hydrochlorothiazide (HYZAAR) 100-25 MG tablet,  Take 1 tablet by mouth daily., Disp: 90 tablet, Rfl: 1   memantine (NAMENDA) 5 MG tablet, Take 1 tablet (5 mg total) by mouth 2 (two) times daily., Disp: 180 tablet, Rfl: 1   rosuvastatin (CRESTOR) 10 MG tablet, Take 1 tablet (10 mg total) by mouth daily., Disp: 90 tablet, Rfl: 1   umeclidinium-vilanterol (ANORO ELLIPTA) 62.5-25 MCG/INH AEPB, Inhale 1 puff into the lungs daily., Disp: 7 each, Rfl: 0  Current Facility-Administered Medications:    0.9 %  sodium chloride infusion, 500 mL, Intravenous, Continuous, Milus Banister, MD   sodium chloride      Physical Exam: Blood pressure 106/60, pulse 68, height 6\' 3"  (1.905 m), weight 246 lb (111.6 kg), SpO2 98 %.    Affect appropriate Chronically ill COPD  HEENT: normal Neck supple with no adenopathy JVP normal no bruits no thyromegaly Lungs clear with no wheezing and good diaphragmatic motion Heart:  S1/S2 no murmur, no rub, gallop or click PMI normal Abdomen: benighn, BS positve, no tenderness, no AAA no bruit.  No HSM or HJR Distal pulses intact with no bruits No edema Neuro non-focal Skin warm and dry No muscular weakness   Labs:   Lab Results  Component Value Date   WBC 4.5 08/11/2021   HGB 15.3 08/11/2021   HCT 45.8 08/11/2021   MCV 92 08/11/2021   PLT 149 (L) 08/11/2021   No results for input(s): NA, K, CL, CO2, BUN, CREATININE, CALCIUM, PROT, BILITOT, ALKPHOS, ALT, AST, GLUCOSE in the last 168 hours.  Invalid input(s): LABALBU No results found for: CKTOTAL, CKMB, CKMBINDEX, TROPONINI  Lab Results  Component Value Date   CHOL 150 08/11/2021   CHOL 135 12/13/2020   CHOL 113 03/15/2020   Lab Results  Component Value Date   HDL 38 (L) 08/11/2021   HDL 44 12/13/2020   HDL 40 03/15/2020   Lab Results  Component Value Date   LDLCALC 84 08/11/2021   LDLCALC 67 12/13/2020   LDLCALC 56 03/15/2020   Lab Results  Component Value Date   TRIG 159 (H) 08/11/2021   TRIG 138 12/13/2020   TRIG 87 03/15/2020    Lab Results  Component Value Date   CHOLHDL 3.9 08/11/2021   CHOLHDL 3.1 12/13/2020   CHOLHDL 2.8 03/15/2020   No results found for: LDLDIRECT    Radiology: No results found.  EKG: SR rate 62 RSR'  2019  08/20/2021 SR rate 64 normal    ASSESSMENT AND PLAN:   CAD:  high calcium score for age CTA with moderate OM/D3 lesions with negative FFR  continue medical Rx  COPD:  with ILD component f/u with Dr Chase Caller ventolin inhaler HLD:  on crestor LDL at goal 104 Memory:  on both namenda and aricept  Depression :  continue lexapro  HTN:  continue Hyzaar low sodium diet and weight loss  Dysphagia:  primary should refer to GI for endoscopy and likely dilatation of esophagus  F/U in a year   Signed: Jenkins Rouge 08/20/2021, 1:50 PM

## 2021-08-20 ENCOUNTER — Encounter: Payer: Self-pay | Admitting: Cardiovascular Disease

## 2021-08-20 ENCOUNTER — Other Ambulatory Visit: Payer: Self-pay

## 2021-08-20 ENCOUNTER — Ambulatory Visit (INDEPENDENT_AMBULATORY_CARE_PROVIDER_SITE_OTHER): Payer: BC Managed Care – PPO | Admitting: Cardiovascular Disease

## 2021-08-20 VITALS — BP 106/60 | HR 68 | Ht 75.0 in | Wt 246.0 lb

## 2021-08-20 DIAGNOSIS — J439 Emphysema, unspecified: Secondary | ICD-10-CM | POA: Diagnosis not present

## 2021-08-20 DIAGNOSIS — R1312 Dysphagia, oropharyngeal phase: Secondary | ICD-10-CM

## 2021-08-20 DIAGNOSIS — I2510 Atherosclerotic heart disease of native coronary artery without angina pectoris: Secondary | ICD-10-CM

## 2021-08-20 DIAGNOSIS — E782 Mixed hyperlipidemia: Secondary | ICD-10-CM

## 2021-08-20 DIAGNOSIS — R931 Abnormal findings on diagnostic imaging of heart and coronary circulation: Secondary | ICD-10-CM

## 2021-08-20 NOTE — Patient Instructions (Signed)
Medication Instructions:  Your physician recommends that you continue on your current medications as directed. Please refer to the Current Medication list given to you today.  *If you need a refill on your cardiac medications before your next appointment, please call your pharmacy*   Lab Work: NONE  If you have labs (blood work) drawn today and your tests are completely normal, you will receive your results only by: MyChart Message (if you have MyChart) OR A paper copy in the mail If you have any lab test that is abnormal or we need to change your treatment, we will call you to review the results.   Testing/Procedures: NONE    Follow-Up: At [facility], you and your health needs are our priority.  As part of our continuing mission to provide you with exceptional heart care, we have created designated Provider Care Teams.  These Care Teams include your primary Cardiologist (physician) and Advanced Practice Providers (APPs -  Physician Assistants and Nurse Practitioners) who all work together to provide you with the care you need, when you need it.  We recommend signing up for the patient portal called "MyChart".  Sign up information is provided on this After Visit Summary.  MyChart is used to connect with patients for Virtual Visits (Telemedicine).  Patients are able to view lab/test results, encounter notes, upcoming appointments, etc.  Non-urgent messages can be sent to your provider as well.   To learn more about what you can do with MyChart, go to [email or web address].    Your next appointment:   6 month(s)  The format for your next appointment:   In Person  Provider:   Peter Nishan, MD   Other Instructions Thank you for choosing [facility]!    

## 2022-02-12 NOTE — Progress Notes (Signed)
CARDIOLOGY CONSULT NOTE       Patient ID: Richard Moore MRN: [id number] DOB/AGE: 60-12-1960 61 y.o.  Admit date: (Not on file) Referring Physician: Hassell Done Primary Physician: Chevis Pretty, FNP Primary Cardiologist: New Reason for Consultation: CAD  Active Problems:   * No active hospital problems. *   HPI:  61 y.o. referred by Dr Hassell Done on 05/09/21 for chest pain and calcium seen on CT scan History of COPD, GERD, HLD, HTN, OSA with oral appliance use Sees Dr Chase Caller for pulmonary with chronic cough and some exertional dyspnea CT in 2019 noted nonspecific pneumonitis and also commented on aortic atherosclerosis and coronary calcification ? Early ILD and 4 mm nodule PFTls with FEV183% mild restriction no dilator response and DLCO 85% Quit smoking about 13 years ago   TTE done 11/29/20 EF 24-09% normal diastolic parameters normal RV no significant valve disease or pulmonary HTN and aortic root 3.8 cm   CT 11/29/20 showed moderate calcification of the mid/distal LAD and mild calcification in the proximal circumflex  Cardiac CTA reviewed 05/16/21 calcium score 802 96 th percentile Left dominant Cors tightest lesion D3/IOM  50-69% FFR CT with normal values D3 0.84 and OM2 0.86  His wife is Camera operator on [employer] on weekends Two older children and one 37 yo at home 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 in [place] and like to be outside  After lengthy talk turns out he went to [employer] with my wife at [place]  Having significant dysphagia and should see GI for endoscopy has seen Dr Ardis Hughs for colonoscopy in 2021   Golfing a lot in [place] this past week No angina Needs nitro   ROS All other systems reviewed and negative except as noted above  Past Medical History:  Diagnosis Date   COPD (chronic obstructive pulmonary disease) ([place])    GERD (gastroesophageal reflux disease)    Hyperlipidemia    Hypertension    Pneumonia    Sleep apnea    doesn't wear CPAP-  uses mouth piece now    Family History  Problem Relation Age of Onset   Lung cancer Father        23 years ago   Colon cancer Neg Hx    Esophageal cancer Neg Hx    Rectal cancer Neg Hx    Stomach cancer Neg Hx     Social History   Socioeconomic History   Marital status: Married    Spouse name: Not on file   Number of children: Not on file   Years of education: Not on file   Highest education level: Not on file  Occupational History   Occupation: put in IT consultant    Employer: [employer]  Tobacco Use   Smoking status: Former    Packs/day: 2.00    Years: 15.00    Pack years: 30.00    Types: Cigarettes    Quit date: 09/15/2007    Years since quitting: 14.4   Smokeless tobacco: Never  Vaping Use   Vaping Use: Never used  Substance and Sexual Activity   Alcohol use: Yes    Alcohol/week: 6.0 standard drinks    Types: 6 Standard drinks or equivalent per week    Comment: occasionally   Drug use: No   Sexual activity: Not on file  Other Topics Concern   Not on file  Social History Narrative   Not on file   Social Determinants of Health   Financial Resource Strain: Not on  file  Food Insecurity: Not on file  Transportation Needs: Not on file  Physical Activity: Not on file  Stress: Not on file  Social Connections: Not on file  Intimate Partner Violence: Not on file    Past Surgical History:  Procedure Laterality Date   COLONOSCOPY     INGUINAL HERNIA REPAIR     right   VASECTOMY        Current Outpatient Medications:    albuterol (VENTOLIN HFA) 108 (90 Base) MCG/ACT inhaler, Inhale 2 puffs into the lungs every 6 (six) hours as needed for wheezing or shortness of breath., Disp: 18 g, Rfl: 3   donepezil (ARICEPT) 10 MG tablet, Take 1 tablet (10 mg total) by mouth at bedtime., Disp: 90 tablet, Rfl: 1   escitalopram (LEXAPRO) 10 MG tablet, Take 1 tablet (10 mg total) by mouth daily., Disp: 90 tablet, Rfl: 1   ezetimibe (ZETIA) 10 MG tablet, Take 1 tablet (10  mg total) by mouth daily., Disp: 90 tablet, Rfl: 1   losartan-hydrochlorothiazide (HYZAAR) 100-25 MG tablet, Take 1 tablet by mouth daily., Disp: 90 tablet, Rfl: 1   memantine (NAMENDA) 5 MG tablet, Take 1 tablet (5 mg total) by mouth 2 (two) times daily., Disp: 180 tablet, Rfl: 1   rosuvastatin (CRESTOR) 10 MG tablet, Take 1 tablet (10 mg total) by mouth daily., Disp: 90 tablet, Rfl: 1   umeclidinium-vilanterol (ANORO ELLIPTA) 62.5-25 MCG/INH AEPB, Inhale 1 puff into the lungs daily., Disp: 7 each, Rfl: 0  Current Facility-Administered Medications:    0.9 %  sodium chloride infusion, 500 mL, Intravenous, Continuous, Milus Banister, MD   sodium chloride      Physical Exam: Blood pressure 108/76, pulse 66, height '6\' 3"'$  (1.905 m), weight 255 lb (115.7 kg), SpO2 95 %.    Affect appropriate Chronically ill COPD  HEENT: normal Neck supple with no adenopathy JVP normal no bruits no thyromegaly Lungs clear with no wheezing and good diaphragmatic motion Heart:  S1/S2 no murmur, no rub, gallop or click PMI normal Abdomen: benighn, BS positve, no tenderness, no AAA no bruit.  No HSM or HJR Distal pulses intact with no bruits No edema Neuro non-focal Skin warm and dry No muscular weakness   Labs:   Lab Results  Component Value Date   WBC 4.5 08/11/2021   HGB 15.3 08/11/2021   HCT 45.8 08/11/2021   MCV 92 08/11/2021   PLT 149 (L) 08/11/2021   No results for input(s): NA, K, CL, CO2, BUN, CREATININE, CALCIUM, PROT, BILITOT, ALKPHOS, ALT, AST, GLUCOSE in the last 168 hours.  Invalid input(s): LABALBU No results found for: CKTOTAL, CKMB, CKMBINDEX, TROPONINI  Lab Results  Component Value Date   CHOL 150 08/11/2021   CHOL 135 12/13/2020   CHOL 113 03/15/2020   Lab Results  Component Value Date   HDL 38 (L) 08/11/2021   HDL 44 12/13/2020   HDL 40 03/15/2020   Lab Results  Component Value Date   LDLCALC 84 08/11/2021   LDLCALC 67 12/13/2020   LDLCALC 56 03/15/2020   Lab  Results  Component Value Date   TRIG 159 (H) 08/11/2021   TRIG 138 12/13/2020   TRIG 87 03/15/2020   Lab Results  Component Value Date   CHOLHDL 3.9 08/11/2021   CHOLHDL 3.1 12/13/2020   CHOLHDL 2.8 03/15/2020   No results found for: LDLDIRECT    Radiology: No results found.  EKG: SR rate 62 RSR'  2019  02/18/2022 SR rate 64 normal  ASSESSMENT AND PLAN:   CAD:  high calcium score for age CTA with moderate OM/D3 lesions with negative FFR continue medical Rx  COPD:  with ILD component f/u with Dr Chase Caller ventolin inhaler HLD:  on crestor LDL at goal 51 Memory:  on both namenda and aricept  Depression :  continue lexapro  HTN:  continue Hyzaar low sodium diet and weight loss  Dysphagia:  primary should refer to GI for endoscopy and likely dilatation of esophagus F/U Dr Ardis Hughs   F/U in a year   Signed: Jenkins Rouge 02/18/2022, 1:20 PM

## 2022-02-18 ENCOUNTER — Encounter: Payer: Self-pay | Admitting: Cardiovascular Disease

## 2022-02-18 ENCOUNTER — Ambulatory Visit (INDEPENDENT_AMBULATORY_CARE_PROVIDER_SITE_OTHER): Payer: BC Managed Care – PPO | Admitting: Cardiovascular Disease

## 2022-02-18 VITALS — BP 108/76 | HR 66 | Ht 75.0 in | Wt 255.0 lb

## 2022-02-18 DIAGNOSIS — R413 Other amnesia: Secondary | ICD-10-CM

## 2022-02-18 DIAGNOSIS — E782 Mixed hyperlipidemia: Secondary | ICD-10-CM

## 2022-02-18 DIAGNOSIS — R931 Abnormal findings on diagnostic imaging of heart and coronary circulation: Secondary | ICD-10-CM | POA: Diagnosis not present

## 2022-02-18 DIAGNOSIS — I2510 Atherosclerotic heart disease of native coronary artery without angina pectoris: Secondary | ICD-10-CM

## 2022-02-18 MED ORDER — NITROGLYCERIN 0.4 MG SL SUBL: 0.4000 mg | SUBLINGUAL_TABLET | SUBLINGUAL | 3 refills | Status: AC | PRN

## 2022-02-18 NOTE — Patient Instructions (Signed)
Medication Instructions:  Your physician recommends that you continue on your current medications as directed. Please refer to the Current Medication list given to you today.  Start [place] as needed for chest pain   *If you need a refill on your cardiac medications before your next appointment, please call your pharmacy*   Lab Work: NONE   If you have labs (blood work) drawn today and your tests are completely normal, you will receive your results only by: [place] (if you have MyChart) OR A paper copy in the mail If you have any lab test that is abnormal or we need to change your treatment, we will call you to review the results.   Testing/Procedures: NONE    Follow-Up: At [facility], you and your health needs are our priority.  As part of our continuing mission to provide you with exceptional heart care, we have created designated Provider Care Teams.  These Care Teams include your primary Cardiologist (physician) and Advanced Practice Providers (APPs -  Physician Assistants and Nurse Practitioners) who all work together to provide you with the care you need, when you need it.  We recommend signing up for the patient portal called "MyChart".  Sign up information is provided on this After Visit Summary.  MyChart is used to connect with patients for Virtual Visits (Telemedicine).  Patients are able to view lab/test results, encounter notes, upcoming appointments, etc.  Non-urgent messages can be sent to your provider as well.   To learn more about what you can do with MyChart, go to [email or web address].    Your next appointment:   1 year(s)  The format for your next appointment:   In Person  Provider:   Jenkins Rouge, MD    Other Instructions Thank you for choosing [facility]!    Important Information About Sugar

## 2022-06-14 DIAGNOSIS — R051 Acute cough: Secondary | ICD-10-CM | POA: Diagnosis not present

## 2022-06-14 DIAGNOSIS — J019 Acute sinusitis, unspecified: Secondary | ICD-10-CM | POA: Diagnosis not present

## 2022-08-26 ENCOUNTER — Other Ambulatory Visit: Payer: Self-pay | Admitting: Nurse Practitioner

## 2022-08-26 DIAGNOSIS — F419 Anxiety disorder, unspecified: Secondary | ICD-10-CM

## 2022-08-26 DIAGNOSIS — E785 Hyperlipidemia, unspecified: Secondary | ICD-10-CM

## 2022-08-26 DIAGNOSIS — I10 Essential (primary) hypertension: Secondary | ICD-10-CM

## 2022-08-26 DIAGNOSIS — R413 Other amnesia: Secondary | ICD-10-CM

## 2022-10-22 ENCOUNTER — Telehealth: Payer: Self-pay | Admitting: Internal Medicine

## 2022-10-23 NOTE — Telephone Encounter (Signed)
Called and spoke with pt letting him know that he will need an appt prior to med refills due to last time seen at office. Pt verbalized understanding and appt was scheduled. Nothing further needed.

## 2022-10-27 ENCOUNTER — Ambulatory Visit (INDEPENDENT_AMBULATORY_CARE_PROVIDER_SITE_OTHER): Payer: BC Managed Care – PPO | Admitting: Acute Care

## 2022-10-27 ENCOUNTER — Encounter: Payer: Self-pay | Admitting: Acute Care

## 2022-10-27 VITALS — BP 134/68 | HR 75 | Temp 98.2°F | Ht 75.0 in | Wt 262.4 lb

## 2022-10-27 DIAGNOSIS — R918 Other nonspecific abnormal finding of lung field: Secondary | ICD-10-CM

## 2022-10-27 DIAGNOSIS — Z760 Encounter for issue of repeat prescription: Secondary | ICD-10-CM | POA: Diagnosis not present

## 2022-10-27 DIAGNOSIS — J849 Interstitial pulmonary disease, unspecified: Secondary | ICD-10-CM

## 2022-10-27 DIAGNOSIS — J441 Chronic obstructive pulmonary disease with (acute) exacerbation: Secondary | ICD-10-CM | POA: Diagnosis not present

## 2022-10-27 MED ORDER — STIOLTO RESPIMAT 2.5-2.5 MCG/ACT IN AERS: 2.0000 | INHALATION_SPRAY | Freq: Every day | RESPIRATORY_TRACT | 12 refills | Status: DC

## 2022-10-27 MED ORDER — STIOLTO RESPIMAT 2.5-2.5 MCG/ACT IN AERS: 2.0000 | INHALATION_SPRAY | Freq: Every day | RESPIRATORY_TRACT | Status: DC

## 2022-10-27 MED ORDER — ALBUTEROL SULFATE HFA 108 (90 BASE) MCG/ACT IN AERS: 2.0000 | INHALATION_SPRAY | Freq: Four times a day (QID) | RESPIRATORY_TRACT | 6 refills | Status: DC | PRN

## 2022-10-27 NOTE — Patient Instructions (Addendum)
It is good to see you today. We will refill your Stialto and albuterol . Continue Stialto 2 puffs daily  Rinse mouth after use Albuterol as needed for shortness of breath or wheezing. Follow up with Dr. Chase Caller at next available for ILD follow up.  Please contact office for sooner follow up if symptoms do not improve or worsen or seek emergency care

## 2022-10-27 NOTE — Progress Notes (Unsigned)
History of Present Illness Richard Moore is a 62 y.o. male with former smoker followed for COPD, chronic cough, abnormal CT chest and right lower lung nodule and stable  ILD at last check in 2022.He is followed by Dr. Chase Caller      10/28/2022 Pt. Presents today for medication refills.He states he has been doing well. He is at his baseline. He has not had any flares. He does have occasional exertional dyspnea. He is compliant with his Stialto daily and his albuterol as needed.  He was last seen by Dr. Chase Caller 04/2021. Last HRCT was 11/2020, and this showed stable disease. He is due for follow up.  Pt had a Coronary Calcium scoring CT Chest 10/2020, at that time there were no suspicious appearing pulmonary nodules or masses .There was notation of hepatic steatosis. I explained that Hepatic steatosis is a term that describes the build up of fat in the liver. It is normal to have small amounts of fat in your liver, but when the proportion of liver cells that contain fat exceeds more than 5% it is indicative of early stage fatty liver.Treatment often involves reducing risk factors through a diet and exercise plan. It is generally a benign condition, but in a small percentage of patients it does require follow up. I have asked the  patient follow up with PCP regarding potential risk factor modification, dietary therapy or pharmacologic therapy if clinically indicated.   Sats are 98%, he needs his follow up with Dr. Chase Caller for his ILD at next available. We have renewed his Stialto prescription, his albuterol rescue inhaler, and provided him with some samples today in the office.   Test Results: Coronary Calcium scoring CT Chest 10/2020 Scattered throughout the periphery of the visualize lungs there are some patchy areas of mild ground-glass attenuation and septal thickening. Within the visualized portions of the thorax there are no suspicious appearing pulmonary nodules or masses, there is  no acute consolidative airspace disease, no pleural effusions, no pneumothorax and no lymphadenopathy. Visualized portions of the upper abdomen demonstrates diffuse low attenuation throughout the visualized hepatic parenchyma, indicative of hepatic steatosis. There are no aggressive appearing lytic or blastic lesions noted in the visualized portions of the skeleton. The appearance of the lung bases is once again concerning for interstitial lung disease, similar to prior high-resolution chest CT 11/29/2020. Hepatic steatosis was also noted.     Latest Ref Rng & Units 08/11/2021    9:17 AM 12/13/2020    9:46 AM 11/07/2020   10:20 AM  CBC  WBC 3.4 - 10.8 x10E3/uL 4.5  5.7  4.3   Hemoglobin 13.0 - 17.7 g/dL 15.3  16.6  16.1   Hematocrit 37.5 - 51.0 % 45.8  49.1  45.8   Platelets 150 - 450 x10E3/uL 149  150  128.0        Latest Ref Rng & Units 08/11/2021    9:17 AM 05/09/2021   10:49 AM 12/13/2020    9:46 AM  BMP  Glucose 70 - 99 mg/dL 105  92  112   BUN 6 - 24 mg/dL 11  15  12   $ Creatinine 0.76 - 1.27 mg/dL 0.80  1.15  0.91   BUN/Creat Ratio 9 - 20 14  13  13   $ Sodium 134 - 144 mmol/L 140  138  136   Potassium 3.5 - 5.2 mmol/L 4.2  4.1  4.5   Chloride 96 - 106 mmol/L 104  99  98  CO2 20 - 29 mmol/L 24  24  23   $ Calcium 8.7 - 10.2 mg/dL 9.0  9.3  9.6     BNP No results found for: "BNP"  ProBNP    Component Value Date/Time   PROBNP 16.0 11/07/2020 1020    PFT    Component Value Date/Time   FEV1PRE 3.60 11/29/2020 0950   FEV1POST 3.75 11/29/2020 0950   FVCPRE 4.38 11/29/2020 0950   FVCPOST 4.50 11/29/2020 0950   TLC 6.17 11/29/2020 0950   DLCOUNC 28.44 11/29/2020 0950   PREFEV1FVCRT 82 11/29/2020 0950   PSTFEV1FVCRT 83 11/29/2020 0950    No results found.   Past medical hx Past Medical History:  Diagnosis Date   COPD (chronic obstructive pulmonary disease) (HCC)    GERD (gastroesophageal reflux disease)    Hyperlipidemia    Hypertension    Pneumonia     Sleep apnea    doesn't wear CPAP- uses mouth piece now     Social History   Tobacco Use   Smoking status: Former    Packs/day: 2.00    Years: 15.00    Total pack years: 30.00    Types: Cigarettes    Quit date: 09/15/2007    Years since quitting: 15.1   Smokeless tobacco: Never  Vaping Use   Vaping Use: Never used  Substance Use Topics   Alcohol use: Yes    Alcohol/week: 6.0 standard drinks of alcohol    Types: 6 Standard drinks or equivalent per week    Comment: occasionally   Drug use: No    Mr.Latif reports that he quit smoking about 15 years ago. His smoking use included cigarettes. He has a 30.00 pack-year smoking history. He has never used smokeless tobacco. He reports current alcohol use of about 6.0 standard drinks of alcohol per week. He reports that he does not use drugs.  Tobacco Cessation: Former smoker , Quit 09/15/2007 with a 30 pack year smoking history   Past surgical hx, Family hx, Social hx all reviewed.  Current Outpatient Medications on File Prior to Visit  Medication Sig   donepezil (ARICEPT) 10 MG tablet TAKE 1 TABLET AT BEDTIME   escitalopram (LEXAPRO) 10 MG tablet TAKE 1 TABLET DAILY   ezetimibe (ZETIA) 10 MG tablet TAKE 1 TABLET DAILY   losartan-hydrochlorothiazide (HYZAAR) 100-25 MG tablet TAKE 1 TABLET DAILY   memantine (NAMENDA) 5 MG tablet TAKE 1 TABLET TWICE A DAY   rosuvastatin (CRESTOR) 10 MG tablet TAKE 1 TABLET DAILY   nitroGLYCERIN (NITROSTAT) 0.4 MG SL tablet Place 1 tablet (0.4 mg total) under the tongue every 5 (five) minutes as needed for chest pain.   Current Facility-Administered Medications on File Prior to Visit  Medication   0.9 %  sodium chloride infusion     No Known Allergies  Review Of Systems:  Constitutional:   No  weight loss, night sweats,  Fevers, chills, fatigue, or  lassitude.  HEENT:   No headaches,  Difficulty swallowing,  Tooth/dental problems, or  Sore throat,                No sneezing, itching, ear ache,  nasal congestion, post nasal drip,   CV:  No chest pain,  Orthopnea, PND, swelling in lower extremities, anasarca, dizziness, palpitations, syncope.   GI  No heartburn, indigestion, abdominal pain, nausea, vomiting, diarrhea, change in bowel habits, loss of appetite, bloody stools.   Resp: + shortness of breath with exertion less at rest.  No excess mucus, no productive  cough,  No non-productive cough,  No coughing up of blood.  No change in color of mucus.  No wheezing.  No chest wall deformity  Skin: no rash or lesions.  GU: no dysuria, change in color of urine, no urgency or frequency.  No flank pain, no hematuria   MS:  No joint pain or swelling.  No decreased range of motion.  No back pain.  Psych:  No change in mood or affect. No depression or anxiety.  No memory loss.   Vital Signs BP 134/68 (BP Location: Left Arm, Patient Position: Sitting, Cuff Size: Large)   Pulse 75   Temp 98.2 F (36.8 C) (Oral)   Ht 6' 3"$  (1.905 m)   Wt 262 lb 6.4 oz (119 kg)   SpO2 98%   BMI 32.80 kg/m    Physical Exam:  General- No distress,  A&Ox3, pleasant ENT: No sinus tenderness, TM clear, pale nasal mucosa, no oral exudate,no post nasal drip, no LAN Cardiac: S1, S2, regular rate and rhythm, no murmur Chest: No wheeze/ rales/ dullness; no accessory muscle use, no nasal flaring, no sternal retractions, few crackles noted per bases  Abd.: Soft Non-tender, ND, BS +, Body mass index is 32.8 kg/m.  Ext: No clubbing cyanosis, edema, no obvious deformities Neuro:  normal strength, MAE x 4, A&O x 3 Skin: No rashes, warm and dry, No lesions  Psych: normal mood and behavior   Assessment/Plan COPD Stable interval Plan We will refill your Stialto and albuterol . Continue Stialto 2 puffs daily  Rinse mouth after use Albuterol as needed for shortness of breath or wheezing. Note your daily symptoms > remember "red flags" for COPD:  Increase in cough, increase in sputum production, increase in  shortness of breath or activity intolerance. If you notice these symptoms, please call to be seen.     Lung Nodules in a former smoker  Plan Last CT Chest 10/2020 showed no nodules of concern Attention at follow up scanning due 2024 for ILD  ILD followed by Dr. Chase Caller Last HRCT 11/2020 Plan Follow up with Dr. Chase Caller at next available for ILD follow up.  Will need HRCT and 6 minute walk  Hepatic Steatosis noted on imaging Plan Follow up with PCP  I spent 25 minutes dedicated to the care of this patient on the date of this encounter to include pre-visit review of records, face-to-face time with the patient discussing conditions above, post visit ordering of testing, clinical documentation with the electronic health record, making appropriate referrals as documented, and communicating necessary information to the patient's healthcare team.   Magdalen Spatz, NP 10/28/2022  9:24 AM

## 2022-10-28 ENCOUNTER — Encounter: Payer: Self-pay | Admitting: Acute Care

## 2022-12-06 NOTE — Progress Notes (Unsigned)
Subjective:     Patient ID: Richard Moore, male   DOB: Apr 28, 1961, 62 y.o.   MRN: [id number]  PCP Chevis Pretty, FNP  HPI  IOV 12/16/2017  Chief Complaint  Patient presents with   Consult    COPD per [facility]. Pt last saw MR 06/22/12.  Pt does have c/o cough sometimes with brown to yellow mucus.    62 year old male who says that in 2013 I took care of him for pneumonia.  Review of the chart and visualization shows that he had CT scan evidence of left lower lobe pneumonia in 2013.  After that he had complete recovery but he says clinically he did not follow-up with me.  Recently went and saw her primary care physician according to his history for left upper back pain along with lower back mid spinal area pain that is chronic.  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  As part of this evaluation for pain he had a chest x-ray that then they reported to him he has COPD [I visualized the chest x-ray from February 2019 and confirmed the presence of hyperinflation and right lower lobe atelectasis] therefore he has been referred here.  He says he that he does not have any symptoms but when I started questioning him again he started admitting to chronic cough for many years.  It happens early in the morning when he brushes the teeth.  His wife notices it.  It does not bother him.  Therefore it is mild to moderate in intensity.  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  Several years ago this was not the case.  There are no other symptoms.  Med review history shows associated lisinopril intake for many years.  Results for ZOLLIE, HERRIOTT (MRN [id number]) as of 12/16/2017 09:30  Ref. Range 11/05/2017 17:19  Creatinine Latest Ref Range: 0.76 - 1.27 mg/dL 0.94   Results for KLAYTEN, YOSHIMOTO (MRN [id number]) as  of 12/16/2017 09:30  Ref. Range 11/05/2017 17:19  Hemoglobin Latest Ref Range: 13.0 - 17.7 g/dL 16.1    CT Chest data April 2019  IMPRESSION: 1. Scattered areas of peribronchovascular and subpleural ground-glass, with somewhat of a mid and lower lung zone predominance. Findings are nonspecific and may be post infectious/postinflammatory in etiology when compared with 02/29/2012. Nonspecific interstitial pneumonitis is not excluded. 2. 4 mm subpleural right lower lobe nodule, likely benign. No follow-up needed if patient is low-risk. Non-contrast chest CT can be considered in 12 months if patient is high-risk. This recommendation follows the consensus statement: Guidelines for Management of Incidental Pulmonary Nodules Detected on CT Images: From the Fleischner Society 2017; Radiology 2017; 284:228-243. 3.  Emphysema (ICD10-J43.9). 4. Aortic atherosclerosis (ICD10-170.0). Coronary artery calcification. 5. Tiny right renal stone.     Electronically Signed   By: Lorin Picket M.D.   On: 01/01/2018 08:47     OV 11/07/2020  Subjective:  Patient ID: Richard Moore, male , DOB: 03/16/1961 , age 62 y.o. , MRN: [id number] , ADDRESS: [place]  [id number] PCP Chevis Pretty, FNP Patient Care Team: Chevis Pretty, FNP as PCP - General (Nurse Practitioner)  This Provider for this visit: Treatment Team:  Attending Provider: Brand Males, MD    11/07/2020 -   Chief Complaint  Patient presents with  Follow-up    Recently had PNA in Jan, still winded easily     HPI Richard Moore 62 y.o. - returns for follow-up.  It is just shy of 3 years since I last saw him.  Therefore technically is an established patient.  He says after he last saw me for shortness of breath and cough [he is on lisinopril] he had CT scan of the chest that showed a 4 mm nodule but also emphysema and plus minus early ILD.  He says he was sent by [employer]  to work in [place].  He was only coming to [place] on the weekends.  Therefore has been unable to keep up with follow-up.  He says he had his Covid vaccine [not documented in our records].  He has had 2 shots of this.  He says that over the last 3 years he has had insidious worsening of shortness of breath with exertion relieved by rest.  He also has cough.  There is no associated chest pain orthopnea proximal nocturnal dyspnea.  He could be snoring.  Symptoms suggest progressive.  He is desperate about the severity of symptoms.  Mid January 2022 he had bronchitis symptoms which he described.  He went to an urgent care and was told he had double pneumonia.  But he says he was never Covid tested.  He never had a chest x-ray but was given antibiotics for "double pneumonia".  He says this is set him back.   CXR Feb 2019  Narrative & Impression  CLINICAL DATA:  Chest pain and cough.   EXAM: CHEST  2 VIEW   COMPARISON:  Chest radiograph June 30, 2013   FINDINGS: Cardiomediastinal silhouette is normal. No pleural effusions or focal consolidations. Similar mild interstitial prominence. Strandy densities RIGHT lung base. Trachea projects midline and there is no pneumothorax. Soft tissue planes and included osseous structures are non-suspicious.   IMPRESSION: Mild suspected COPD with RIGHT lung base atelectasis.     Electronically Signed   By: Elon Alas M.D.   On: 11/06/2017 01:43      .   11/29/2020 Follow up ; COPD, chronic cough, abnormal CT chest and right lung nodule Patient returns for a 1 month follow-up.  Patient was seen last visit for a follow-up with complaints of shortness of breath and cough lingering for 1 month. Sick in January 2022, dx with Pneumonia . Treated with antibiotics and steroids . Got better but cough and dyspnea persisted. Patient was set up for multiple tests and lab work.  Last visit labs showed a negative D-dimer, BNP.  Mildly elevated LFT with  ALT at 54.  Normal CBC with eosinophils at absolute count 100.  Platelets were slightly low at 128,000. High-resolution CT chest and 2D echo are pending. Covid IGG was very high indicative of recent infection.  He was started on Spiriva last visit but did not get this at the pharmacy.  We called the pharmacy but prescription was not sent .  Since last visit patient says he is feeling about the same.  Gets short of breath with activities.  Has a daily chronic cough.  Patient is fully vaccinated for COVID-19.  Of note patient is on a ACE inhibitor. He denies any fever or discolored mucus chest pain orthopnea PND or increased leg swelling. PFTs today show mild restriction with an FEV1 at 83%, ratio 83, FVC 76%.  No significant bronchodilator response, DLCO 85%.  Plan Begin Spiriva 2 puffs daily .  Activity as tolerated Once CT and Echo are resulted,results will be called.  Discussed with primary care provider that Zestoretic (lisinopril) could be making your cough worse, may need an alternative if able Follow-up with Dr. Chase Caller in 6 weeks and as needed Please contact office for sooner follow up if symptoms do not improve or worsen or seek emergency care    OV 02/07/2021  Subjective:  Patient ID: Richard Moore, male , DOB: October 18, 1960 , age 59 y.o. , MRN: [id number] , ADDRESS: [place] [address] PCP Chevis Pretty, FNP Patient Care Team: Chevis Pretty, FNP as PCP - General (Nurse Practitioner)  This Provider for this visit: Treatment Team:  Attending Provider: Brand Males, MD    02/07/2021 -   Chief Complaint  Patient presents with   Follow-up    2 mo f/u. States he developed PNA again back in April 2022.    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 -presents for follow-up.  Last seen by myself in February 2022.  He then  followed up with nurse practitioner in March 2022 and was started on Spiriva.  After that he had high-resolution CT chest.  I personally visualized this.  He has some basilar postinflammatory ILD changes.  This is mild.  It is unchanged compared to 2019.  I suspect this is postinflammatory ILD.  His pulmonary function test shows restriction with lower diffusion reflecting combination of emphysema and ILD.  On walking desaturation test today he did not desaturate.  He was started on Spiriva and this is helping.  In April 2022 he had pneumonia according to him but he describes to me as COPD exacerbation symptoms.  Resolved after 2 rounds of antibiotics and 1 round of prednisone.  He says his x-ray showed pneumonia I do not have that x-ray with [place].  The x-ray was done after the CT scan.  So the CT scan does not show any pneumonia.  His main complaint is that he is very short of breath particularly walking upstairs and at work.  Sometimes there is associated exertional chest pain.  He does have coronary artery calcification on the CT.  His echo was normal but he is never had a cardiac stress test seen a cardiologist.  His symptoms definitely seem out of proportion to his walking desaturation test.    HRCT 11/29/20  Narrative & Impression  CLINICAL DATA:  62 year old male with history of shortness of breath. Chronic cough.   EXAM: CT CHEST WITHOUT CONTRAST   TECHNIQUE: Multidetector CT imaging of the chest was performed following the standard protocol without intravenous contrast. High resolution imaging of the lungs, as well as inspiratory and expiratory imaging, was performed.   COMPARISON:  High-resolution chest CT 12/31/2017.   FINDINGS: Cardiovascular: Heart size is normal. There is no significant pericardial fluid, thickening or pericardial calcification. There is aortic atherosclerosis, as well as atherosclerosis of the great vessels of the mediastinum and the coronary arteries,  including calcified atherosclerotic plaque in the left anterior descending and left circumflex coronary arteries.   Mediastinum/Nodes: No pathologically enlarged mediastinal or hilar lymph nodes. Please note that accurate exclusion of hilar adenopathy is limited on noncontrast CT scans. Esophagus is unremarkable in appearance. No axillary lymphadenopathy.   Lungs/Pleura: High-resolution images again demonstrate some patchy areas of peripheral predominant ground-glass attenuation, septal thickening, mild cylindrical bronchiectasis and peripheral bronchiolectasis. These findings have no definitive craniocaudal gradient.  No definitive honeycombing confidently identified. Inspiratory and expiratory imaging demonstrates some mild air trapping indicative of mild small airways disease. Mild centrilobular and paraseptal emphysema again noted. No acute consolidative airspace disease. No pleural effusions. No suspicious appearing pulmonary nodules or masses are noted. Small calcified granuloma in the periphery of the right upper lobe.   Upper Abdomen: Diffuse low attenuation throughout the visualized hepatic parenchyma, indicative of hepatic steatosis.   Musculoskeletal: There are no aggressive appearing lytic or blastic lesions noted in the visualized portions of the skeleton.   IMPRESSION: 1. The appearance of the lungs is compatible with interstitial lung disease, with a spectrum of findings considered most compatible with an alternative diagnosis (not usual interstitial pneumonia) per current ATS guidelines. Overall, findings have been stable compared to prior study from 2019 and are favored to reflect mild nonspecific interstitial pneumonia (NSIP) in conjunction with a background of mild centrilobular and paraseptal emphysema. 2. Aortic atherosclerosis, in addition to 2 vessel coronary artery disease. Please note that although the presence of coronary artery calcium documents the  presence of coronary artery disease, the severity of this disease and any potential stenosis cannot be assessed on this non-gated CT examination. Assessment for potential risk factor modification, dietary therapy or pharmacologic therapy may be warranted, if clinically indicated. 3. Hepatic steatosis.   Aortic Atherosclerosis (ICD10-I70.0) and Emphysema (ICD10-J43.9).     Electronically Signed   By: Vinnie Langton M.D.   On: 11/29/2020 12:05      ECHO 11/29/20   IMPRESSIONS     1. Left ventricular ejection fraction, by estimation, is 60 to 65%. The  left ventricle has normal function. The left ventricle has no regional  wall motion abnormalities. Left ventricular diastolic parameters were  normal. The average left ventricular  global longitudinal strain is 17.6 %. The global longitudinal strain is  normal.   2. Right ventricular systolic function is normal. The right ventricular  size is normal. There is normal pulmonary artery systolic pressure.   3. The mitral valve is normal in structure. Trivial mitral valve  regurgitation. No evidence of mitral stenosis.   4. The aortic valve is tricuspid. Aortic valve regurgitation is not  visualized. No aortic stenosis is present.   5. Aortic dilatation noted. There is borderline dilatation of the  ascending aorta, measuring 38 mm.   6. The inferior vena cava is normal in size with greater than 50%  respiratory variability, suggesting right atrial pressure of 3 mmHg.     OV 06/25/2021  Subjective:  Patient ID: Richard Moore, male , DOB: 28-Oct-1960 , age 11 y.o. , MRN: [id number] , ADDRESS: [place] [address] PCP Chevis Pretty, FNP Patient Care Team: Chevis Pretty, [place] as PCP - General (Nurse Practitioner)  This Provider for this visit: Treatment Team:  Attending Provider: Brand Males, MD    06/25/2021 -   Chief Complaint  Patient presents with   Follow-up    Pt states he  has been doing good since last visit and denies any complaints.    HPI Richard Moore 62 y.o. -returns for follow-up.  He has dyspnea due to obesity and pulmonary issues of mild emphysema and postinflammatory ILD.  He had coronary artery calcification.  He saw Dr. Collier Salina admission.  He has been deemed to have nonobstructive coronary artery disease.  I reviewed the notes.  At this point in time his dyspnea still persist.  His dyspnea score is 14.  See below.  It is some improved  after Spiriva but he still frustrated by it.  He says he might be getting anxious.Marland Kitchen  His anxiety score is 5.  He has never been to pulmonary rehabilitation.  He is working overtime.  He has difficulty attending pulmonary rehabilitation.  He thinks tuberculin by Christmas.  He is willing to attend pulmonary rehabilitation in January 2023.  We discussed about upgrading his Spiriva which has been helping him to combination Stiolto or Anoro.  He is willing to try this.  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  I have told him about this.  There is no family history of COPD but I advised him to get his family members checked out.  He declined a flu shot.  He has obesity and we discussed weight loss options.  Does not have diabetes.  Advised him a low glycemic diet with mild calorie restriction.  He is willing to try this.   10/28/2022 Pt. Presents today for medication refills.He states he has been doing well. He is at his baseline. He has not had any flares. He does have occasional exertional dyspnea. He is compliant with his Stialto daily and his albuterol as needed.  He was last seen by Dr. Chase Caller 04/2021. Last HRCT was 11/2020, and this showed stable disease. He is due for follow up.  Pt had a Coronary Calcium scoring CT Chest 10/2020, at that time there were no suspicious appearing pulmonary nodules or masses .There was notation of hepatic steatosis. I explained that Hepatic steatosis is a term that describes the build up of fat in the  liver. It is normal to have small amounts of fat in your liver, but when the proportion of liver cells that contain fat exceeds more than 5% it is indicative of early stage fatty liver.Treatment often involves reducing risk factors through a diet and exercise plan. It is generally a benign condition, but in a small percentage of patients it does require follow up. I have asked the  patient follow up with PCP regarding potential risk factor modification, dietary therapy or pharmacologic therapy if clinically indicated.   Sats are 98%, he needs his follow up with Dr. Chase Caller for his ILD at next available. We have renewed his Stialto prescription, his albuterol rescue inhaler, and provided him with some samples today in the office.   Test Results: Coronary Calcium scoring CT Chest 10/2020 Scattered throughout the periphery of the visualize lungs there are some patchy areas of mild ground-glass attenuation and septal thickening. Within the visualized portions of the thorax there are no suspicious appearing pulmonary nodules or masses, there is no acute consolidative airspace disease, no pleural effusions, no pneumothorax and no lymphadenopathy. Visualized portions of the upper abdomen demonstrates diffuse low attenuation throughout the visualized hepatic parenchyma, indicative of hepatic steatosis. There are no aggressive appearing lytic or blastic lesions noted in the visualized portions of the skeleton. The appearance of the lung bases is once again concerning for interstitial lung disease, similar to prior high-resolution chest CT 11/29/2020. Hepatic steatosis was also noted.   OV 12/07/2022  Subjective:  Patient ID: Richard Moore, male , DOB: Dec 27, 1960 , age 83 y.o. , MRN: [id number] , ADDRESS: [address] [place] [address] PCP Chevis Pretty, FNP Patient Care Team: Chevis Pretty, FNP as PCP - General (Nurse Practitioner)  This Provider for this visit:  Treatment Team:  Attending Provider: Brand Males, MD   Follow-up emphysema and a previous smoker  - alpha 1 MS 02/07/21 with level 90s Follow-up postinflammatory interstitial lung  disease following pneumonia admission in 2015  - negative serology May 2022  12/07/2022 -   Chief Complaint  Patient presents with   Follow-up    No c/o f/u on copd      HPI Richard Moore 62 y.o. -returns for follow-up.  I personally not seen him since 2022.  He then ran out of his Stiolto and saw a nurse practitioner Barbaraann Barthel last month.  She refilled this on a 90-day supply with [facility].  He says he is doing well.  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  He has dyspnea on exertion but it is mild.  It is not impeding his work.  He continues to be very strong and fully active.  His last pulmonary function test was in 2022.  Last CT scan of the chest was in 2022.  I went over a smoking history.  He states he smoked at least 3 packs/day for 15 years and quit 15 years ago.  This makes him 45 pack smoking history.  We went over lung cancer screening guidelines.  He also has ILD.  He is willing to get a high-resolution CT scan of the chest done that will cover both progression of ILD issues and also lung cancer screening.  Subjectively he has not progressed.  However we do not have objective data.  We discussed respiratory vaccines.  He says he took COVID-vaccine upon the insistence of his wife but he prefers not to take vaccines.  We went over the flu shot and the RSV vaccine.  Indicated to him that these vaccines are more established safety profile.  He is going to reflect whether he will change his mindset about vaccines.       SYMPTOM SCALE - ILD 02/07/2021  06/25/2021   O2 use ra ra  Shortness of Breath 0 -> 5 scale with 5 being worst (score 6 If unable to do)   At rest 2 0  Simple tasks - showers, clothes change, eating, shaving 3 2  Household (dishes, doing bed, laundry) 3 2  Shopping 3 2   Walking level at own pace 3 3  Walking up Stairs 5 5  Total (30-36) Dyspnea Score 19 14  How bad is your cough? 2 1  How bad is your fatigue 4 5  How bad is nausea 0 0  How bad is vomiting?  0 0  How bad is diarrhea? 4 0  How bad is anxiety? 3 5  How bad is depression 1 0      Simple office walk 185 feet x  3 laps goal with forehead probe 02/07/2021   O2 used ra  Number laps completed 3  Comments about pace x  Resting Pulse Ox/HR 98% and 65/min  Final Pulse Ox/HR 99% and 86/min  Desaturated </= 88% no  Desaturated <= 3% points no  Got Tachycardic >/= 90/min no  Symptoms at end of test Dyspnea t end of 2nd lap  Miscellaneous comments x     PFT     Latest Ref Rng & Units 11/29/2020    9:50 AM  PFT Results  FVC-Pre L 4.38   FVC-Predicted Pre % 74   FVC-Post L 4.50   FVC-Predicted Post % 76   Pre FEV1/FVC % % 82   Post FEV1/FCV % % 83   FEV1-Pre L 3.60   FEV1-Predicted Pre % 80   FEV1-Post L 3.75   DLCO uncorrected ml/min/mmHg 28.44   DLCO UNC% % 85   DLCO corrected  ml/min/mmHg 27.35   DLCO COR %Predicted % 81   DLVA Predicted % 105   TLC L 6.17   TLC % Predicted % 75   RV % Predicted % 70        has a past medical history of COPD (chronic obstructive pulmonary disease) ([place]), GERD (gastroesophageal reflux disease), Hyperlipidemia, Hypertension, Pneumonia, and Sleep apnea.   reports that he quit smoking about 15 years ago. His smoking use included cigarettes. He has a 30.00 pack-year smoking history. He has never used smokeless tobacco.  Past Surgical History:  Procedure Laterality Date   COLONOSCOPY     INGUINAL HERNIA REPAIR     right   VASECTOMY      No Known Allergies   There is no immunization history on file for this patient.  Family History  Problem Relation Age of Onset   Lung cancer Father        23 years ago   Colon cancer Neg Hx    Esophageal cancer Neg Hx    Rectal cancer Neg Hx    Stomach cancer Neg Hx      Current  Outpatient Medications:    albuterol (VENTOLIN HFA) 108 (90 Base) MCG/ACT inhaler, Inhale 2 puffs into the lungs every 6 (six) hours as needed for wheezing or shortness of breath., Disp: 18 g, Rfl: 6   donepezil (ARICEPT) 10 MG tablet, TAKE 1 TABLET AT BEDTIME, Disp: 90 tablet, Rfl: 0   escitalopram (LEXAPRO) 10 MG tablet, TAKE 1 TABLET DAILY, Disp: 90 tablet, Rfl: 0   ezetimibe (ZETIA) 10 MG tablet, TAKE 1 TABLET DAILY, Disp: 90 tablet, Rfl: 0   losartan-hydrochlorothiazide (HYZAAR) 100-25 MG tablet, TAKE 1 TABLET DAILY, Disp: 90 tablet, Rfl: 0   memantine (NAMENDA) 5 MG tablet, TAKE 1 TABLET TWICE A DAY, Disp: 180 tablet, Rfl: 0   nitroGLYCERIN (NITROSTAT) 0.4 MG SL tablet, Place 1 tablet (0.4 mg total) under the tongue every 5 (five) minutes as needed for chest pain., Disp: 25 tablet, Rfl: 3   rosuvastatin (CRESTOR) 10 MG tablet, TAKE 1 TABLET DAILY, Disp: 90 tablet, Rfl: 0   Tiotropium Bromide-Olodaterol (STIOLTO RESPIMAT) 2.5-2.5 MCG/ACT AERS, Inhale 2 puffs into the lungs daily., Disp: 1 each, Rfl: 12   Tiotropium Bromide-Olodaterol (STIOLTO RESPIMAT) 2.5-2.5 MCG/ACT AERS, Inhale 2 puffs into the lungs daily., Disp: 4 g, Rfl: 0.  Current Facility-Administered Medications:    0.9 %  sodium chloride infusion, 500 mL, Intravenous, Continuous, Milus Banister, MD      Objective:   Vitals:   12/07/22 0901  BP: 134/82  Pulse: 60  Temp: 98 F (36.7 C)  SpO2: 98%  Weight: 256 lb (116.1 kg)  Height: 6\' 3"  (1.905 m)    Estimated body mass index is 32 kg/m as calculated from the following:   Height as of this encounter: 6\' 3"  (1.905 m).   Weight as of this encounter: 256 lb (116.1 kg).  @WEIGHTCHANGE @  [employer]   12/07/22 0901  Weight: 256 lb (116.1 kg)     Physical Exam    General: No distress. Looks well Neuro: Alert and Oriented x 3. GCS 15. Speech normal Psych: Pleasant Resp:  Barrel Chest - no.  Wheeze - no, Crackles - no, No overt respiratory distress CVS:  Normal heart sounds. Murmurs - no Ext: Stigmata of Connective Tissue Disease - no HEENT: Normal upper airway. PEERL +. No post nasal drip        Assessment:  ICD-10-CM   1. Pulmonary emphysema, unspecified emphysema type ([place])  J43.9     2. ILD (interstitial lung disease) ([place])  J84.9     3. Stopped smoking with greater than 30 pack year history  Z87.891          Plan:     Patient Instructions     Pulmonary emphysema, unspecified emphysema type ([place]) Alpha 1 MS phenotyple  - stable   Plan  - continue stiolto scheduled with albuterol as needed   ILD (interstitial lung disease) ([place])  - this is mild and post inflamatory from 2013 pneumonia. Stable between CT 2019 =-> 2022 - doubt wil progress but got to keep an eye  Plan - do HRCT next few weeks  - Full PFT in 6 months (last March 2022)  Lung cancer screeening 30-45pack per day former smoker Last CT chest sept 2022   Plan  - capture information in HRCT next few weeks  Followup - 6 months or sooner if needed; 15 min vsiit  - ILD symp score and walk test at fllowup    SIGNATURE    Dr. Brand Males, M.D., F.C.C.P,  Pulmonary and Critical Care Medicine Staff Physician, [facility] Director - Interstitial Lung Disease  Program  [facility] [place] [facility] [place], [place], [address]  Pager: [phone], If no answer or between  15:00h - 7:00h: call [phone] Telephone: [phone]  9:18 AM 12/07/2022

## 2022-12-07 ENCOUNTER — Encounter: Payer: Self-pay | Admitting: Internal Medicine

## 2022-12-07 ENCOUNTER — Ambulatory Visit (INDEPENDENT_AMBULATORY_CARE_PROVIDER_SITE_OTHER): Payer: BC Managed Care – PPO | Admitting: Internal Medicine

## 2022-12-07 VITALS — BP 134/82 | HR 60 | Temp 98.0°F | Ht 75.0 in | Wt 256.0 lb

## 2022-12-07 DIAGNOSIS — J439 Emphysema, unspecified: Secondary | ICD-10-CM | POA: Diagnosis not present

## 2022-12-07 DIAGNOSIS — J849 Interstitial pulmonary disease, unspecified: Secondary | ICD-10-CM | POA: Diagnosis not present

## 2022-12-07 DIAGNOSIS — Z87891 Personal history of nicotine dependence: Secondary | ICD-10-CM | POA: Diagnosis not present

## 2022-12-07 NOTE — Patient Instructions (Addendum)
    Pulmonary emphysema, unspecified emphysema type ([place]) Alpha 1 MS phenotyple  - stable   Plan  - continue stiolto scheduled with albuterol as needed   ILD (interstitial lung disease) ([place])  - this is mild and post inflamatory from 2013 pneumonia. Stable between CT 2019 =-> 2022 - doubt wil progress but got to keep an eye  Plan - do HRCT next few weeks  - Full PFT in 6 months (last March 2022)  Lung cancer screeening 30-45pack per day former smoker Last CT chest sept 2022   Plan  - capture information in HRCT next few weeks  Followup - 6 months or sooner if needed; 15 min vsiit  - ILD symp score and walk test at [facility]

## 2022-12-23 ENCOUNTER — Ambulatory Visit (HOSPITAL_COMMUNITY): Payer: BC Managed Care – PPO

## 2022-12-28 ENCOUNTER — Ambulatory Visit (HOSPITAL_COMMUNITY)
Admission: RE | Admit: 2022-12-28 | Discharge: 2022-12-28 | Disposition: A | Discharge status: Home / Self Care | Payer: BC Managed Care – PPO | Source: Ambulatory Visit | Attending: Nurse Practitioner | Admitting: Nurse Practitioner

## 2022-12-28 DIAGNOSIS — K760 Fatty (change of) liver, not elsewhere classified: Secondary | ICD-10-CM | POA: Diagnosis not present

## 2022-12-28 DIAGNOSIS — I700 Atherosclerosis of aorta: Secondary | ICD-10-CM | POA: Diagnosis not present

## 2022-12-28 DIAGNOSIS — J849 Interstitial pulmonary disease, unspecified: Secondary | ICD-10-CM | POA: Insufficient documentation

## 2022-12-28 DIAGNOSIS — I2510 Atherosclerotic heart disease of native coronary artery without angina pectoris: Secondary | ICD-10-CM | POA: Diagnosis not present

## 2022-12-28 DIAGNOSIS — J439 Emphysema, unspecified: Secondary | ICD-10-CM | POA: Diagnosis not present

## 2022-12-28 DIAGNOSIS — J479 Bronchiectasis, uncomplicated: Secondary | ICD-10-CM | POA: Diagnosis not present

## 2023-01-12 ENCOUNTER — Other Ambulatory Visit: Payer: Self-pay | Admitting: Nurse Practitioner

## 2023-01-12 ENCOUNTER — Encounter: Payer: Self-pay | Admitting: Nurse Practitioner

## 2023-01-12 DIAGNOSIS — R413 Other amnesia: Secondary | ICD-10-CM

## 2023-01-12 DIAGNOSIS — F419 Anxiety disorder, unspecified: Secondary | ICD-10-CM

## 2023-01-12 DIAGNOSIS — E785 Hyperlipidemia, unspecified: Secondary | ICD-10-CM

## 2023-01-12 DIAGNOSIS — I10 Essential (primary) hypertension: Secondary | ICD-10-CM

## 2023-01-12 NOTE — Telephone Encounter (Signed)
Letter mailed

## 2023-01-12 NOTE — Telephone Encounter (Signed)
MMM NTBS Last OV 08/11/21

## 2023-01-14 ENCOUNTER — Telehealth: Payer: Self-pay | Admitting: Nurse Practitioner

## 2023-01-14 DIAGNOSIS — I10 Essential (primary) hypertension: Secondary | ICD-10-CM

## 2023-01-14 DIAGNOSIS — R413 Other amnesia: Secondary | ICD-10-CM

## 2023-01-14 DIAGNOSIS — F419 Anxiety disorder, unspecified: Secondary | ICD-10-CM

## 2023-01-14 DIAGNOSIS — E785 Hyperlipidemia, unspecified: Secondary | ICD-10-CM

## 2023-01-14 NOTE — Telephone Encounter (Signed)
Patient was calling in to request refills of all his medications.  Patient states he is working out of town and will not be back for three months.  Patient's last office visit was 08/11/21 and he was advised to follow up in 6 months at that time.  Patient was informed we cannot refill medications without an appointment.  He said that it is impossible for him to come in because he will lose $1000 daily for missing work.  Patient was told he should see urgent care or another provider where he is located to get a temporary supply until he can get back in town to be seen here and his medications prescribed.

## 2023-01-14 NOTE — Telephone Encounter (Signed)
Patient calling because he is out of town and is unable to come in for an appt and wants to talk to PCP nurse about medications. Please call back.

## 2023-01-20 NOTE — Telephone Encounter (Signed)
Pt received a letter from [facility] stating that she would refill these meds as a courtesy but can't give him any other refills until he sees her.  EZETIMIBE TABS 10MG  LOSARTAN/ HYDROCHLOROTHIAZIDE TABS 100/25MG  DONEPEZIL HCL TABS 10MG  ESCITALOPRAM TABS 10MG   Please send these to [facility].

## 2023-01-21 MED ORDER — EZETIMIBE 10 MG PO TABS: 10.0000 mg | ORAL_TABLET | Freq: Every day | ORAL | 0 refills | Status: DC

## 2023-01-21 MED ORDER — LOSARTAN POTASSIUM-HCTZ 100-25 MG PO TABS
1.0000 | ORAL_TABLET | Freq: Every day | ORAL | 0 refills | Status: DC
Start: 2023-01-21 — End: 2023-01-22

## 2023-01-21 MED ORDER — ESCITALOPRAM OXALATE 10 MG PO TABS
10.0000 mg | ORAL_TABLET | Freq: Every day | ORAL | 0 refills | Status: DC
Start: 2023-01-21 — End: 2023-01-22

## 2023-01-21 MED ORDER — LOSARTAN POTASSIUM-HCTZ 100-25 MG PO TABS: 1.0000 | ORAL_TABLET | Freq: Every day | ORAL | 0 refills | Status: DC

## 2023-01-21 MED ORDER — DONEPEZIL HCL 10 MG PO TABS: 10.0000 mg | ORAL_TABLET | Freq: Every day | ORAL | 0 refills | Status: DC

## 2023-01-21 MED ORDER — EZETIMIBE 10 MG PO TABS
10.0000 mg | ORAL_TABLET | Freq: Every day | ORAL | 0 refills | Status: DC
Start: 2023-01-21 — End: 2023-01-22

## 2023-01-21 MED ORDER — ESCITALOPRAM OXALATE 10 MG PO TABS: 10.0000 mg | ORAL_TABLET | Freq: Every day | ORAL | 0 refills | Status: DC

## 2023-01-21 MED ORDER — DONEPEZIL HCL 10 MG PO TABS
10.0000 mg | ORAL_TABLET | Freq: Every day | ORAL | 0 refills | Status: DC
Start: 2023-01-21 — End: 2023-01-22

## 2023-01-21 NOTE — Telephone Encounter (Signed)
[facility]  Meds ordered this encounter  Medications   DISCONTD: escitalopram (LEXAPRO) 10 MG tablet    Sig: Take 1 tablet (10 mg total) by mouth daily.    Dispense:  90 tablet    Refill:  0    Order Specific Question:   Supervising Provider    Answer:   Arville Care A [[id number]]   DISCONTD: losartan-hydrochlorothiazide (HYZAAR) 100-25 MG tablet    Sig: Take 1 tablet by mouth daily.    Dispense:  90 tablet    Refill:  0    Order Specific Question:   Supervising Provider    Answer:   Arville Care A [[id number]]   DISCONTD: donepezil (ARICEPT) 10 MG tablet    Sig: Take 1 tablet (10 mg total) by mouth at bedtime.    Dispense:  90 tablet    Refill:  0    Order Specific Question:   Supervising Provider    Answer:   Arville Care A [[id number]]   DISCONTD: ezetimibe (ZETIA) 10 MG tablet    Sig: Take 1 tablet (10 mg total) by mouth daily.    Dispense:  90 tablet    Refill:  0    Order Specific Question:   Supervising Provider    Answer:   Arville Care A [[id number]]   donepezil (ARICEPT) 10 MG tablet    Sig: Take 1 tablet (10 mg total) by mouth at bedtime.    Dispense:  90 tablet    Refill:  0    Order Specific Question:   Supervising Provider    Answer:   Arville Care A [[id number]]   escitalopram (LEXAPRO) 10 MG tablet    Sig: Take 1 tablet (10 mg total) by mouth daily.    Dispense:  90 tablet    Refill:  0    Order Specific Question:   Supervising Provider    Answer:   Arville Care A [[id number]]   ezetimibe (ZETIA) 10 MG tablet    Sig: Take 1 tablet (10 mg total) by mouth daily.    Dispense:  90 tablet    Refill:  0    Order Specific Question:   Supervising Provider    Answer:   Arville Care A [[id number]]   losartan-hydrochlorothiazide (HYZAAR) 100-25 MG tablet    Sig: Take 1 tablet by mouth daily.    Dispense:  90 tablet    Refill:  0    Order Specific Question:   Supervising Provider    Answer:   Nils Pyle [[id number]]    Mary-Margaret Daphine Deutscher, FNP

## 2023-01-21 NOTE — Addendum Note (Signed)
Addended by: Bennie Pierini on: 01/21/2023 09:21 AM   Modules accepted: Orders

## 2023-01-22 ENCOUNTER — Telehealth: Payer: Self-pay | Admitting: Nurse Practitioner

## 2023-01-22 MED ORDER — DONEPEZIL HCL 10 MG PO TABS
10.0000 mg | ORAL_TABLET | Freq: Every day | ORAL | 0 refills | Status: DC
Start: 2023-01-22 — End: 2023-04-01

## 2023-01-22 MED ORDER — LOSARTAN POTASSIUM-HCTZ 100-25 MG PO TABS
1.0000 | ORAL_TABLET | Freq: Every day | ORAL | 0 refills | Status: DC
Start: 2023-01-22 — End: 2023-04-01

## 2023-01-22 MED ORDER — ESCITALOPRAM OXALATE 10 MG PO TABS
10.0000 mg | ORAL_TABLET | Freq: Every day | ORAL | 0 refills | Status: DC
Start: 2023-01-22 — End: 2023-04-01

## 2023-01-22 MED ORDER — EZETIMIBE 10 MG PO TABS
10.0000 mg | ORAL_TABLET | Freq: Every day | ORAL | 0 refills | Status: DC
Start: 2023-01-22 — End: 2023-04-01

## 2023-01-22 NOTE — Telephone Encounter (Signed)
Patient calls in and states he can't get his medications that were called in due to [facility] running behind. Patient is coming in for Mother's Day and needs some called into [facility] in [facility] so he can get his medication to get him through til [facility] send him some. Patient is out. Please call.

## 2023-01-22 NOTE — Addendum Note (Signed)
Addended by: Julious Payer D on: 01/22/2023 04:23 PM   Modules accepted: Orders

## 2023-01-22 NOTE — Telephone Encounter (Signed)
Pt aware refill sent to pharmacy & aware when he is done working out of town he will be in to be seen.

## 2023-01-25 ENCOUNTER — Other Ambulatory Visit: Payer: Self-pay | Admitting: *Deleted

## 2023-01-25 DIAGNOSIS — E785 Hyperlipidemia, unspecified: Secondary | ICD-10-CM

## 2023-01-25 MED ORDER — ROSUVASTATIN CALCIUM 10 MG PO TABS
10.0000 mg | ORAL_TABLET | Freq: Every day | ORAL | 0 refills | Status: DC
Start: 2023-01-25 — End: 2023-04-01

## 2023-01-25 NOTE — Telephone Encounter (Signed)
Patient states he is working out of town and will not be back for three months Other meds sent to [facility] on 01/21/23, pt aware he is to make an appt when he gets back into town.

## 2023-03-08 ENCOUNTER — Telehealth: Payer: Self-pay | Admitting: Nurse Practitioner

## 2023-03-08 DIAGNOSIS — E785 Hyperlipidemia, unspecified: Secondary | ICD-10-CM

## 2023-03-08 DIAGNOSIS — Z125 Encounter for screening for malignant neoplasm of prostate: Secondary | ICD-10-CM

## 2023-03-08 DIAGNOSIS — I10 Essential (primary) hypertension: Secondary | ICD-10-CM

## 2023-03-09 NOTE — Telephone Encounter (Signed)
Lab orders placed Mary-Margaret Daphine Deutscher, FNP

## 2023-03-09 NOTE — Telephone Encounter (Signed)
Orders placed.

## 2023-03-26 ENCOUNTER — Other Ambulatory Visit: Payer: BC Managed Care – PPO

## 2023-03-26 DIAGNOSIS — E785 Hyperlipidemia, unspecified: Secondary | ICD-10-CM

## 2023-03-26 DIAGNOSIS — I10 Essential (primary) hypertension: Secondary | ICD-10-CM

## 2023-03-26 DIAGNOSIS — R739 Hyperglycemia, unspecified: Secondary | ICD-10-CM | POA: Diagnosis not present

## 2023-03-26 DIAGNOSIS — Z125 Encounter for screening for malignant neoplasm of prostate: Secondary | ICD-10-CM | POA: Diagnosis not present

## 2023-03-26 LAB — CBC WITH DIFFERENTIAL/PLATELET
Basos: 1 %
Eos: 2 %
Hematocrit: 43.2 % (ref 37.5–51.0)
Hemoglobin: 14.7 g/dL (ref 13.0–17.7)
Immature Grans (Abs): 0 10*3/uL (ref 0.0–0.1)
Lymphs: 27 %
MCV: 92 fL (ref 79–97)
Monocytes Absolute: 0.6 10*3/uL (ref 0.1–0.9)
Monocytes: 10 %
Neutrophils: 60 %
Platelets: 126 10*3/uL — ABNORMAL LOW (ref 150–450)

## 2023-03-26 LAB — CMP14+EGFR
AST: 46 IU/L — ABNORMAL HIGH (ref 0–40)
Alkaline Phosphatase: 42 IU/L — ABNORMAL LOW (ref 44–121)
BUN: 14 mg/dL (ref 8–27)
Bilirubin Total: 0.4 mg/dL (ref 0.0–1.2)
Calcium: 9.1 mg/dL (ref 8.6–10.2)
Globulin, Total: 1.8 g/dL (ref 1.5–4.5)
Glucose: 128 mg/dL — ABNORMAL HIGH (ref 70–99)
Total Protein: 6.1 g/dL (ref 6.0–8.5)

## 2023-03-26 LAB — LIPID PANEL

## 2023-03-26 LAB — PSA, TOTAL AND FREE

## 2023-03-27 LAB — CBC WITH DIFFERENTIAL/PLATELET
Basophils Absolute: 0 10*3/uL (ref 0.0–0.2)
EOS (ABSOLUTE): 0.1 10*3/uL (ref 0.0–0.4)
Immature Granulocytes: 0 %
Lymphocytes Absolute: 1.6 10*3/uL (ref 0.7–3.1)
MCH: 31.3 pg (ref 26.6–33.0)
MCHC: 34 g/dL (ref 31.5–35.7)
Neutrophils Absolute: 3.4 10*3/uL (ref 1.4–7.0)
RBC: 4.7 x10E6/uL (ref 4.14–5.80)
RDW: 13.6 % (ref 11.6–15.4)
WBC: 5.7 10*3/uL (ref 3.4–10.8)

## 2023-03-27 LAB — LIPID PANEL
Chol/HDL Ratio: 5.1 ratio — ABNORMAL HIGH (ref 0.0–5.0)
HDL: 38 mg/dL — ABNORMAL LOW (ref >39)
LDL Chol Calc (NIH): 95 mg/dL (ref 0–99)
Triglycerides: 360 mg/dL — ABNORMAL HIGH (ref 0–149)
VLDL Cholesterol Cal: 60 mg/dL — ABNORMAL HIGH (ref 5–40)

## 2023-03-27 LAB — CMP14+EGFR
ALT: 60 IU/L — ABNORMAL HIGH (ref 0–44)
Albumin: 4.3 g/dL (ref 3.8–4.9)
BUN/Creatinine Ratio: 17 (ref 10–24)
CO2: 23 mmol/L (ref 20–29)
Creatinine, Ser: 0.84 mg/dL (ref 0.76–1.27)
Potassium: 4.2 mmol/L (ref 3.5–5.2)

## 2023-03-27 LAB — PSA, TOTAL AND FREE
PSA, Free Pct: 43.3 %
PSA, Free: 0.13 ng/mL

## 2023-03-30 LAB — HEMOGLOBIN A1C
Est. average glucose Bld gHb Est-mCnc: 140 mg/dL
Hgb A1c MFr Bld: 6.5 % — ABNORMAL HIGH (ref 4.8–5.6)

## 2023-03-30 LAB — SPECIMEN STATUS REPORT

## 2023-04-01 ENCOUNTER — Ambulatory Visit: Payer: BC Managed Care – PPO | Admitting: Nurse Practitioner

## 2023-04-01 ENCOUNTER — Encounter: Payer: Self-pay | Admitting: Nurse Practitioner

## 2023-04-01 VITALS — BP 167/92 | HR 54 | Temp 97.1°F | Resp 20 | Ht 75.0 in | Wt 265.0 lb

## 2023-04-01 DIAGNOSIS — J439 Emphysema, unspecified: Secondary | ICD-10-CM | POA: Diagnosis not present

## 2023-04-01 DIAGNOSIS — F419 Anxiety disorder, unspecified: Secondary | ICD-10-CM

## 2023-04-01 DIAGNOSIS — Z0001 Encounter for general adult medical examination with abnormal findings: Secondary | ICD-10-CM

## 2023-04-01 DIAGNOSIS — J441 Chronic obstructive pulmonary disease with (acute) exacerbation: Secondary | ICD-10-CM

## 2023-04-01 DIAGNOSIS — E785 Hyperlipidemia, unspecified: Secondary | ICD-10-CM

## 2023-04-01 DIAGNOSIS — I10 Essential (primary) hypertension: Secondary | ICD-10-CM | POA: Diagnosis not present

## 2023-04-01 DIAGNOSIS — L409 Psoriasis, unspecified: Secondary | ICD-10-CM

## 2023-04-01 DIAGNOSIS — K2100 Gastro-esophageal reflux disease with esophagitis, without bleeding: Secondary | ICD-10-CM

## 2023-04-01 DIAGNOSIS — Z0000 Encounter for general adult medical examination without abnormal findings: Secondary | ICD-10-CM

## 2023-04-01 DIAGNOSIS — Z6830 Body mass index (BMI) 30.0-30.9, adult: Secondary | ICD-10-CM

## 2023-04-01 DIAGNOSIS — R413 Other amnesia: Secondary | ICD-10-CM

## 2023-04-01 MED ORDER — ALBUTEROL SULFATE HFA 108 (90 BASE) MCG/ACT IN AERS
2.0000 | INHALATION_SPRAY | Freq: Four times a day (QID) | RESPIRATORY_TRACT | 6 refills | Status: DC | PRN
Start: 2023-04-01 — End: 2023-06-10

## 2023-04-01 MED ORDER — ESCITALOPRAM OXALATE 10 MG PO TABS
10.0000 mg | ORAL_TABLET | Freq: Every day | ORAL | 1 refills | Status: DC
Start: 2023-04-01 — End: 2023-12-06

## 2023-04-01 MED ORDER — LOSARTAN POTASSIUM-HCTZ 100-25 MG PO TABS
1.0000 | ORAL_TABLET | Freq: Every day | ORAL | 0 refills | Status: DC
Start: 2023-04-01 — End: 2023-12-21

## 2023-04-01 MED ORDER — TRIAMCINOLONE ACETONIDE 0.1 % EX CREA: 1.0000 | TOPICAL_CREAM | Freq: Two times a day (BID) | CUTANEOUS | 0 refills | Status: DC

## 2023-04-01 MED ORDER — ROSUVASTATIN CALCIUM 10 MG PO TABS
10.0000 mg | ORAL_TABLET | Freq: Every day | ORAL | 1 refills | Status: DC
Start: 2023-04-01 — End: 2024-04-27

## 2023-04-01 MED ORDER — PANTOPRAZOLE SODIUM 40 MG PO TBEC: 40.0000 mg | DELAYED_RELEASE_TABLET | Freq: Every day | ORAL | 3 refills | Status: DC

## 2023-04-01 MED ORDER — EZETIMIBE 10 MG PO TABS
10.0000 mg | ORAL_TABLET | Freq: Every day | ORAL | 1 refills | Status: DC
Start: 2023-04-01 — End: 2023-12-06

## 2023-04-01 MED ORDER — STIOLTO RESPIMAT 2.5-2.5 MCG/ACT IN AERS
2.0000 | INHALATION_SPRAY | Freq: Every day | RESPIRATORY_TRACT | 12 refills | Status: DC
Start: 2023-04-01 — End: 2023-12-06

## 2023-04-01 MED ORDER — DONEPEZIL HCL 10 MG PO TABS
10.0000 mg | ORAL_TABLET | Freq: Every day | ORAL | 0 refills | Status: DC
Start: 2023-04-01 — End: 2023-12-06

## 2023-04-01 NOTE — Addendum Note (Signed)
Addended by: Bennie Pierini on: 04/01/2023 10:02 AM   Modules accepted: Orders

## 2023-04-01 NOTE — Progress Notes (Addendum)
Subjective:    Patient ID: Richard Moore, male    DOB: 1960-11-27, 62 y.o.   MRN: [id number]   Chief Complaint: medical management of chronic issues     HPI:  Richard Moore is a 62 y.o. who identifies as a male who was assigned male at birth.   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 Essential hypertension No c/o chest pain, sob or headache. Doe snot check blood pressure at home. BP Readings from Last 3 Encounters:  12/07/22 134/82  10/27/22 134/68  02/18/22 108/76     2. Hyperlipidemia with target LDL less than 100 Foes not watch diet. His job is very physical so he does no exercise. Lab Results  Component Value Date   CHOL 193 03/26/2023   HDL 38 (L) 03/26/2023   LDLCALC 95 03/26/2023   TRIG 360 (H) 03/26/2023   CHOLHDL 5.1 (H) 03/26/2023     3. Pulmonary emphysema, unspecified emphysema type (HCC) Is on stiolto and is doing well. No c/o wheezing or SOB.  4. Anxiety Has a very stressful job. Stays stressed    04/01/2023    9:19 AM 08/11/2021    8:53 AM 12/13/2020    9:05 AM  GAD 7 : Generalized Anxiety Score  Nervous, Anxious, on Edge 0 0 2  Control/stop worrying 0 0 1  Worry too much - different things 0 0 1  Trouble relaxing 3 3 1   Restless 0 0 0  Easily annoyed or irritable 3 3 2   Afraid - awful might happen 0 0 3  Total GAD 7 Score 6 6 10   Anxiety Difficulty Somewhat difficult Somewhat difficult Not difficult at all      5. Memory disturbance He is on namenda and aricept  6. BMI 30.0-30.9,adult No recent weight changes Wt Readings from Last 3 Encounters:  04/01/23 265 lb (120.2 kg)  12/07/22 256 lb (116.1 kg)  10/27/22 262 lb 6.4 oz (119 kg)   BMI Readings from Last 3 Encounters:  04/01/23 33.12 kg/m  12/07/22 32.00 kg/m  10/27/22 32.80 kg/m     New complaints: - rash on knee and ankle- been there for over a year-  nothing helps -reflux- uses OTC meds and are nothelping  No Known Allergies Outpatient Encounter Medications as of 04/01/2023  Medication Sig   albuterol (VENTOLIN HFA) 108 (90 Base) MCG/ACT inhaler Inhale 2 puffs into the lungs every 6 (six) hours as needed for wheezing or shortness of breath.   donepezil (ARICEPT) 10 MG tablet Take 1 tablet (10 mg total) by mouth at bedtime.   escitalopram (LEXAPRO) 10 MG tablet Take 1 tablet (10 mg total) by mouth daily.   ezetimibe (ZETIA) 10 MG tablet Take 1 tablet (10 mg total) by mouth daily.   losartan-hydrochlorothiazide (HYZAAR) 100-25 MG tablet Take 1 tablet by mouth daily.   memantine (NAMENDA) 5 MG tablet TAKE 1 TABLET TWICE A DAY   nitroGLYCERIN (NITROSTAT) 0.4 MG SL tablet Place 1 tablet (0.4 mg total) under the tongue every 5 (five) minutes as needed for chest pain.   rosuvastatin (CRESTOR) 10 MG tablet Take 1 tablet (10 mg total) by mouth daily.   Tiotropium Bromide-Olodaterol (STIOLTO RESPIMAT) 2.5-2.5 MCG/ACT AERS Inhale 2 puffs into the lungs daily.   Tiotropium Bromide-Olodaterol (STIOLTO RESPIMAT) 2.5-2.5 MCG/ACT AERS Inhale 2 puffs into the lungs daily.   Facility-Administered Encounter Medications as of 04/01/2023  Medication   0.9 %  sodium chloride infusion    Past Surgical History:  Procedure Laterality Date   COLONOSCOPY     INGUINAL HERNIA REPAIR     right   VASECTOMY      Family History  Problem Relation Age of Onset   Lung cancer Father        23 years ago   Colon cancer Neg Hx    Esophageal cancer Neg Hx    Rectal cancer Neg Hx    Stomach cancer Neg Hx       Controlled substance contract: n/a     Review of Systems  Constitutional:  Negative for diaphoresis.  Eyes:  Negative for pain.  Respiratory:  Negative for shortness of breath.   Cardiovascular:  Negative for chest pain, palpitations and leg swelling.  Gastrointestinal:  Negative for abdominal pain.  Endocrine: Negative for polydipsia.  Skin:   Negative for rash.  Neurological:  Negative for dizziness, weakness and headaches.  Hematological:  Does not bruise/bleed easily.  All other systems reviewed and are negative.      Objective:   Physical Exam Vitals and nursing note reviewed.  Constitutional:      Appearance: Normal appearance. He is well-developed.  HENT:     Head: Normocephalic.     Nose: Nose normal.     Mouth/Throat:     Mouth: Mucous membranes are moist.     Pharynx: Oropharynx is clear.  Eyes:     Pupils: Pupils are equal, round, and reactive to light.  Neck:     Thyroid: No thyroid mass or thyromegaly.     Vascular: No carotid bruit or JVD.     Trachea: Phonation normal.  Cardiovascular:     Rate and Rhythm: Normal rate and regular rhythm.  Pulmonary:     Effort: Pulmonary effort is normal. No respiratory distress.     Breath sounds: Normal breath sounds.  Abdominal:     General: Bowel sounds are normal.     Palpations: Abdomen is soft.     Tenderness: There is no abdominal tenderness.  Musculoskeletal:        General: Normal range of motion.     Cervical back: Normal range of motion and neck supple.  Lymphadenopathy:     Cervical: No cervical adenopathy.  Skin:    General: Skin is warm and dry.     Comments: Scaly rash with silver plaque on right knee, right foot and palms of hands  Neurological:     Mental Status: He is alert and oriented to person, place, and time.  Psychiatric:        Behavior: Behavior normal.        Thought Content: Thought content normal.        Judgment: Judgment normal.     BP (!) 167/92   Pulse (!) 54   Temp (!) 97.1 F (36.2 C) (Temporal)   Resp 20   Ht 6\' 3"  (1.905 m)   Wt 265 lb (120.2 kg)   SpO2 98%   BMI 33.12 kg/m        Assessment & Plan:  Richard Moore comes in today with chief complaint of Annual Exam   Diagnosis and orders addressed:  1. Annual physical exam Exercise b to stay healthy  2. Essential hypertension Low sodium  diet  3. Hyperlipidemia with target LDL less than 100 Low fat diet  4. Pulmonary emphysema, unspecified emphysema type (HCC) Avoid cigarette smoke No vaping  5.  Anxiety Stress Management  6. Memory disturbance Brain exercises Read daily  7. BMI 30.0-30.9,adult Discussed diet and exercise for person with BMI >25 Will recheck weight in 3-6 months  8. psoriasis Triamcinolone cream daily Do not pick or scratch Labs pending  9. GERD Avoid spicy foods Do not eat 2 hours prior to bedtime  Meds ordered this encounter  Medications   triamcinolone cream (KENALOG) 0.1 %    Sig: Apply 1 Application topically 2 (two) times daily.    Dispense:  30 g    Refill:  0    Order Specific Question:   Supervising Provider    Answer:   Arville Care A [[id number]]   escitalopram (LEXAPRO) 10 MG tablet    Sig: Take 1 tablet (10 mg total) by mouth daily.    Dispense:  90 tablet    Refill:  1    Order Specific Question:   Supervising Provider    Answer:   Arville Care A [[id number]]   albuterol (VENTOLIN HFA) 108 (90 Base) MCG/ACT inhaler    Sig: Inhale 2 puffs into the lungs every 6 (six) hours as needed for wheezing or shortness of breath.    Dispense:  18 g    Refill:  6    Order Specific Question:   Supervising Provider    Answer:   Arville Care A [[id number]]   Tiotropium Bromide-Olodaterol (STIOLTO RESPIMAT) 2.5-2.5 MCG/ACT AERS    Sig: Inhale 2 puffs into the lungs daily.    Dispense:  1 each    Refill:  12    Order Specific Question:   Supervising Provider    Answer:   Arville Care A [[id number]]   ezetimibe (ZETIA) 10 MG tablet    Sig: Take 1 tablet (10 mg total) by mouth daily.    Dispense:  14 tablet    Refill:  1    Order Specific Question:   Supervising Provider    Answer:   Arville Care A [[id number]]   rosuvastatin (CRESTOR) 10 MG tablet    Sig: Take 1 tablet (10 mg total) by mouth daily.    Dispense:  90 tablet    Refill:  1    Order Specific Question:    Supervising Provider    Answer:   Arville Care A [[id number]]   donepezil (ARICEPT) 10 MG tablet    Sig: Take 1 tablet (10 mg total) by mouth at bedtime.    Dispense:  14 tablet    Refill:  0    Order Specific Question:   Supervising Provider    Answer:   Arville Care A [[id number]]   losartan-hydrochlorothiazide (HYZAAR) 100-25 MG tablet    Sig: Take 1 tablet by mouth daily.    Dispense:  14 tablet    Refill:  0    Order Specific Question:   Supervising Provider    Answer:   Arville Care A [[id number]]   pantoprazole (PROTONIX) 40 MG tablet    Sig: Take 1 tablet (40 mg total) by mouth daily.    Dispense:  30 tablet    Refill:  3    Order Specific Question:   Supervising Provider    Answer:   Arville Care A [id number]    Health Maintenance reviewed Diet and exercise encouraged  Follow up plan: 6 months   Mary-Margaret Daphine Deutscher, FNP

## 2023-04-21 NOTE — Progress Notes (Signed)
Patient r/c  

## 2023-05-21 ENCOUNTER — Ambulatory Visit: Payer: BC Managed Care – PPO | Admitting: Nurse Practitioner

## 2023-06-10 ENCOUNTER — Encounter: Payer: Self-pay | Admitting: Nurse Practitioner

## 2023-06-10 ENCOUNTER — Ambulatory Visit: Payer: BC Managed Care – PPO | Admitting: Nurse Practitioner

## 2023-06-10 VITALS — BP 144/78 | HR 60 | Temp 98.2°F | Resp 20 | Ht 75.0 in | Wt 252.0 lb

## 2023-06-10 DIAGNOSIS — E785 Hyperlipidemia, unspecified: Secondary | ICD-10-CM | POA: Diagnosis not present

## 2023-06-10 DIAGNOSIS — L409 Psoriasis, unspecified: Secondary | ICD-10-CM

## 2023-06-10 DIAGNOSIS — F419 Anxiety disorder, unspecified: Secondary | ICD-10-CM

## 2023-06-10 DIAGNOSIS — I10 Essential (primary) hypertension: Secondary | ICD-10-CM | POA: Diagnosis not present

## 2023-06-10 DIAGNOSIS — R413 Other amnesia: Secondary | ICD-10-CM

## 2023-06-10 DIAGNOSIS — J439 Emphysema, unspecified: Secondary | ICD-10-CM | POA: Diagnosis not present

## 2023-06-10 DIAGNOSIS — R739 Hyperglycemia, unspecified: Secondary | ICD-10-CM | POA: Diagnosis not present

## 2023-06-10 DIAGNOSIS — J441 Chronic obstructive pulmonary disease with (acute) exacerbation: Secondary | ICD-10-CM

## 2023-06-10 DIAGNOSIS — Z6830 Body mass index (BMI) 30.0-30.9, adult: Secondary | ICD-10-CM

## 2023-06-10 LAB — BAYER DCA HB A1C WAIVED: HB A1C (BAYER DCA - WAIVED): 5.8 % — ABNORMAL HIGH (ref 4.8–5.6)

## 2023-06-10 MED ORDER — ALBUTEROL SULFATE HFA 108 (90 BASE) MCG/ACT IN AERS: 2.0000 | INHALATION_SPRAY | Freq: Four times a day (QID) | RESPIRATORY_TRACT | 6 refills | Status: DC | PRN

## 2023-06-10 MED ORDER — TRIAMCINOLONE ACETONIDE 0.1 % EX CREA: 1.0000 | TOPICAL_CREAM | Freq: Two times a day (BID) | CUTANEOUS | 0 refills | Status: AC

## 2023-06-10 NOTE — Progress Notes (Signed)
Subjective:    Patient ID: Richard Moore, male    DOB: 02-Nov-1960, 62 y.o.   MRN: [id number]   Chief Complaint: Recheck Hgb A1c    HPI:  Richard Moore is a 62 y.o. who identifies as a male who was assigned male at birth.   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 Elevated blood sugar Does not check blood sugar at home except occasionally. Lab Results  Component Value Date   HGBA1C 6.5 (H) 03/26/2023     2. Essential hypertension No c/o chest pain, sob or headache. Does not check blood pressure at home. BP Readings from Last 3 Encounters:  06/10/23 (!) 166/97  04/01/23 (!) 167/92  12/07/22 134/82     3. Hyperlipidemia with target LDL less than 100 Is on zetia and crestor. Doe snot watch diet and does no exercise. Lab Results  Component Value Date   CHOL 193 03/26/2023   HDL 38 (L) 03/26/2023   LDLCALC 95 03/26/2023   TRIG 360 (H) 03/26/2023   CHOLHDL 5.1 (H) 03/26/2023     4. Pulmonary emphysema, unspecified emphysema type (HCC) Is on stiolto daily and is doing wel.denies cough and wheezing.  5. Anxiety Is on lexapro and is doing well    06/10/2023   10:17 AM 04/01/2023    9:19 AM 08/11/2021    8:53 AM 12/13/2020    9:05 AM  GAD 7 : Generalized Anxiety Score  Nervous, Anxious, on Edge 0 0 0 2  Control/stop worrying 0 0 0 1  Worry too much - different things 0 0 0 1  Trouble relaxing 3 3 3 1   Restless 0 0 0 0  Easily annoyed or irritable 3 3 3 2   Afraid - awful might happen 0 0 0 3  Total GAD 7 Score 6 6 6 10   Anxiety Difficulty Not difficult at all Somewhat difficult Somewhat difficult Not difficult at all      6. Memory disturbance Is on aricept. He says he is doing well.  7. BMI 30.0-30.9,adult Weight is down 13lbs Wt Readings from Last 3 Encounters:  06/10/23 252 lb (114.3 kg)  04/01/23 265 lb (120.2 kg)   12/07/22 256 lb (116.1 kg)   BMI Readings from Last 3 Encounters:  06/10/23 31.50 kg/m  04/01/23 33.12 kg/m  12/07/22 32.00 kg/m      New complaints: None today  No Known Allergies Outpatient Encounter Medications as of 06/10/2023  Medication Sig   albuterol (VENTOLIN HFA) 108 (90 Base) MCG/ACT inhaler Inhale 2 puffs into the lungs every 6 (six) hours as needed for wheezing or shortness of breath.   donepezil (ARICEPT) 10 MG tablet Take 1 tablet (10 mg total) by mouth at bedtime.   escitalopram (LEXAPRO) 10 MG tablet Take 1 tablet (10 mg total) by mouth daily.   ezetimibe (ZETIA) 10 MG tablet Take 1 tablet (10 mg total) by mouth daily.   losartan-hydrochlorothiazide (HYZAAR) 100-25 MG tablet Take 1 tablet by mouth daily.   nitroGLYCERIN (NITROSTAT) 0.4 MG SL tablet Place 1 tablet (0.4 mg total) under the tongue every 5 (five) minutes as needed for chest pain.   pantoprazole (PROTONIX) 40 MG tablet Take 1 tablet (40 mg total) by mouth daily.   rosuvastatin (CRESTOR) 10 MG tablet Take 1 tablet (10 mg total) by mouth daily.   Tiotropium Bromide-Olodaterol (STIOLTO RESPIMAT) 2.5-2.5 MCG/ACT AERS  Inhale 2 puffs into the lungs daily.   triamcinolone cream (KENALOG) 0.1 % Apply 1 Application topically 2 (two) times daily.   Facility-Administered Encounter Medications as of 06/10/2023  Medication   0.9 %  sodium chloride infusion    Past Surgical History:  Procedure Laterality Date   COLONOSCOPY     INGUINAL HERNIA REPAIR     right   VASECTOMY      Family History  Problem Relation Age of Onset   Lung cancer Father        23 years ago   Colon cancer Neg Hx    Esophageal cancer Neg Hx    Rectal cancer Neg Hx    Stomach cancer Neg Hx       Controlled substance contract: n/a     Review of Systems  Constitutional:  Negative for diaphoresis.  Eyes:  Negative for pain.  Respiratory:  Negative for shortness of breath.   Cardiovascular:  Negative for chest pain,  palpitations and leg swelling.  Gastrointestinal:  Negative for abdominal pain.  Endocrine: Negative for polydipsia.  Skin:  Negative for rash.  Neurological:  Negative for dizziness, weakness and headaches.  Hematological:  Does not bruise/bleed easily.  All other systems reviewed and are negative.      Objective:   Physical Exam Vitals and nursing note reviewed.  Constitutional:      Appearance: Normal appearance. He is well-developed.  HENT:     Head: Normocephalic.     Nose: Nose normal.     Mouth/Throat:     Mouth: Mucous membranes are moist.     Pharynx: Oropharynx is clear.  Eyes:     Pupils: Pupils are equal, round, and reactive to light.  Neck:     Thyroid: No thyroid mass or thyromegaly.     Vascular: No carotid bruit or JVD.     Trachea: Phonation normal.  Cardiovascular:     Rate and Rhythm: Normal rate and regular rhythm.  Pulmonary:     Effort: Pulmonary effort is normal. No respiratory distress.     Breath sounds: Normal breath sounds.  Abdominal:     General: Bowel sounds are normal.     Palpations: Abdomen is soft.     Tenderness: There is no abdominal tenderness.  Musculoskeletal:        General: Normal range of motion.     Cervical back: Normal range of motion and neck supple.  Lymphadenopathy:     Cervical: No cervical adenopathy.  Skin:    General: Skin is warm and dry.     Comments: Hands cracked with flaky dry rash on palms  Neurological:     Mental Status: He is alert and oriented to person, place, and time.  Psychiatric:        Behavior: Behavior normal.        Thought Content: Thought content normal.        Judgment: Judgment normal.     BP (!) 161/89   Pulse 60   Temp 98.2 F (36.8 C) (Temporal)   Resp 20   Ht 6\' 3"  (1.905 m)   Wt 252 lb (114.3 kg)   SpO2 95%   BMI 31.50 kg/m   HGBa1c 5.8%     Assessment & Plan:   Richard Moore in today with chief complaint of Recheck Hgb A1c   1. Elevated blood sugar Continue  to avoid soft drinks - Bayer DCA Hb A1c Waived  2. Essential hypertension Low sodium diet  3. Hyperlipidemia with target LDL less than 100 Low fat diet  4. Pulmonary emphysema, unspecified emphysema type (HCC) Continue stilotio  5. Anxiety Stress management  6. Memory disturbance Daily orientation  7. BMI 30.0-30.9,adult Discussed diet and exercise for person with BMI >25 Will recheck weight in 3-6 months   8. Psoriasis - triamcinolone cream (KENALOG) 0.1 %; Apply 1 Application topically 2 (two) times daily.  Dispense: 453 g; Refill: 0  9. COPD with acute exacerbation (HCC) - albuterol (VENTOLIN HFA) 108 (90 Base) MCG/ACT inhaler; Inhale 2 puffs into the lungs every 6 (six) hours as needed for wheezing or shortness of breath.  Dispense: 18 g; Refill: 6    The above assessment and management plan was discussed with the patient. The patient verbalized understanding of and has agreed to the management plan. Patient is aware to call the clinic if symptoms persist or worsen. Patient is aware when to return to the clinic for a follow-up visit. Patient educated on when it is appropriate to go to the emergency department.   Mary-Margaret Daphine Deutscher, FNP

## 2023-06-10 NOTE — Patient Instructions (Signed)

## 2023-07-20 NOTE — Progress Notes (Deleted)
CARDIOLOGY CONSULT NOTE       Patient ID: Richard Moore MRN: [id number] DOB/AGE: 61-24-1962 61 y.o.  Admit date: (Not on file) Referring Physician: Daphine Deutscher Primary Physician: Bennie Pierini, FNP Primary Cardiologist: Eden Emms Reason for Consultation: CAD   HPI:  62 y.o. referred by Dr Daphine Deutscher on 05/09/21 for chest pain and calcium seen on CT scan History of COPD, GERD, HLD, HTN, OSA with oral appliance use Sees Dr Marchelle Gearing for pulmonary with chronic cough and some exertional dyspnea CT in 2019 noted nonspecific pneumonitis and also commented on aortic atherosclerosis and coronary calcification ? Early ILD and 4 mm nodule PFTls with FEV183% mild restriction no dilator response and DLCO 85% Quit smoking about 13 years ago   TTE done 11/29/20 EF 60-65% normal diastolic parameters normal RV no significant valve disease or pulmonary HTN and aortic root 3.8 cm   CT 11/29/20 showed moderate calcification of the mid/distal LAD and mild calcification in the proximal circumflex  Cardiac CTA reviewed 05/16/21 calcium score 802 96 th percentile Left dominant Cors tightest lesion D3/IOM  50-69% FFR CT with normal values D3 0.84 and OM2 0.86  His wife is Press photographer on 5W on weekends Two older children and one 61 yo at home Has worked in Engineer, 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 in [place] and like to be outside  After lengthy talk turns out he went to [employer] with my wife at [place]  Having significant dysphagia and should see GI for endoscopy has seen Dr Christella Hartigan for colonoscopy in 2021   Likes to golf No angina   ***  ROS All other systems reviewed and negative except as noted above  Past Medical History:  Diagnosis Date   COPD (chronic obstructive pulmonary disease) (HCC)    GERD (gastroesophageal reflux disease)    Hyperlipidemia    Hypertension    Pneumonia    Sleep apnea    doesn't wear CPAP- uses mouth piece now    Family History  Problem Relation Age of Onset   Lung cancer  Father        23 years ago   Colon cancer Neg Hx    Esophageal cancer Neg Hx    Rectal cancer Neg Hx    Stomach cancer Neg Hx     Social History   Socioeconomic History   Marital status: Married    Spouse name: Not on file   Number of children: Not on file   Years of education: Not on file   Highest education level: Not on file  Occupational History   Occupation: put in Administrator    Employer: [employer]  Tobacco Use   Smoking status: Former    Current packs/day: 0.00    Average packs/day: 2.0 packs/day for 15.0 years (30.0 ttl pk-yrs)    Types: Cigarettes    Start date: 09/14/1992    Quit date: 09/15/2007    Years since quitting: 15.8   Smokeless tobacco: Never  Vaping Use   Vaping status: Never Used  Substance and Sexual Activity   Alcohol use: Yes    Alcohol/week: 6.0 standard drinks of alcohol    Types: 6 Standard drinks or equivalent per week    Comment: occasionally   Drug use: No   Sexual activity: Not on file  Other Topics Concern   Not on file  Social History Narrative   Not on file   Social Determinants of Health   Financial Resource Strain: Not on file  Food Insecurity: Not  on file  Transportation Needs: Not on file  Physical Activity: Not on file  Stress: Not on file  Social Connections: Not on file  Intimate Partner Violence: Not on file    Past Surgical History:  Procedure Laterality Date   COLONOSCOPY     INGUINAL HERNIA REPAIR     right   VASECTOMY        Current Outpatient Medications:    albuterol (VENTOLIN HFA) 108 (90 Base) MCG/ACT inhaler, Inhale 2 puffs into the lungs every 6 (six) hours as needed for wheezing or shortness of breath., Disp: 18 g, Rfl: 6   donepezil (ARICEPT) 10 MG tablet, Take 1 tablet (10 mg total) by mouth at bedtime., Disp: 14 tablet, Rfl: 0   escitalopram (LEXAPRO) 10 MG tablet, Take 1 tablet (10 mg total) by mouth daily., Disp: 90 tablet, Rfl: 1   ezetimibe (ZETIA) 10 MG tablet, Take 1 tablet (10 mg total)  by mouth daily., Disp: 14 tablet, Rfl: 1   losartan-hydrochlorothiazide (HYZAAR) 100-25 MG tablet, Take 1 tablet by mouth daily., Disp: 14 tablet, Rfl: 0   nitroGLYCERIN (NITROSTAT) 0.4 MG SL tablet, Place 1 tablet (0.4 mg total) under the tongue every 5 (five) minutes as needed for chest pain., Disp: 25 tablet, Rfl: 3   pantoprazole (PROTONIX) 40 MG tablet, Take 1 tablet (40 mg total) by mouth daily., Disp: 30 tablet, Rfl: 3   rosuvastatin (CRESTOR) 10 MG tablet, Take 1 tablet (10 mg total) by mouth daily., Disp: 90 tablet, Rfl: 1   Tiotropium Bromide-Olodaterol (STIOLTO RESPIMAT) 2.5-2.5 MCG/ACT AERS, Inhale 2 puffs into the lungs daily., Disp: 1 each, Rfl: 12   triamcinolone cream (KENALOG) 0.1 %, Apply 1 Application topically 2 (two) times daily., Disp: 453 g, Rfl: 0  Current Facility-Administered Medications:    0.9 %  sodium chloride infusion, 500 mL, Intravenous, Continuous, Rachael Fee, MD   sodium chloride      Physical Exam: There were no vitals taken for this visit.    Affect appropriate Chronically ill COPD  HEENT: normal Neck supple with no adenopathy JVP normal no bruits no thyromegaly Lungs clear with no wheezing and good diaphragmatic motion Heart:  S1/S2 no murmur, no rub, gallop or click PMI normal Abdomen: benighn, BS positve, no tenderness, no AAA no bruit.  No HSM or HJR Distal pulses intact with no bruits No edema Neuro non-focal Skin warm and dry No muscular weakness   Labs:   Lab Results  Component Value Date   WBC 5.7 03/26/2023   HGB 14.7 03/26/2023   HCT 43.2 03/26/2023   MCV 92 03/26/2023   PLT 126 (L) 03/26/2023   No results for input(s): "NA", "K", "CL", "CO2", "BUN", "CREATININE", "CALCIUM", "PROT", "BILITOT", "ALKPHOS", "ALT", "AST", "GLUCOSE" in the last 168 hours.  Invalid input(s): "LABALBU" No results found for: "CKTOTAL", "CKMB", "CKMBINDEX", "TROPONINI"  Lab Results  Component Value Date   CHOL 193 03/26/2023   CHOL 150  08/11/2021   CHOL 135 12/13/2020   Lab Results  Component Value Date   HDL 38 (L) 03/26/2023   HDL 38 (L) 08/11/2021   HDL 44 12/13/2020   Lab Results  Component Value Date   LDLCALC 95 03/26/2023   LDLCALC 84 08/11/2021   LDLCALC 67 12/13/2020   Lab Results  Component Value Date   TRIG 360 (H) 03/26/2023   TRIG 159 (H) 08/11/2021   TRIG 138 12/13/2020   Lab Results  Component Value Date   CHOLHDL 5.1 (H) 03/26/2023  CHOLHDL 3.9 08/11/2021   CHOLHDL 3.1 12/13/2020   No results found for: "LDLDIRECT"    Radiology: No results found.  EKG: SR rate 62 RSR'  2019  07/20/2023 SR rate 64 normal    ASSESSMENT AND PLAN:   CAD:  high calcium score for age CTA  07/09/21 with moderate OM/D3 lesions with negative FFR continue medical Rx  COPD:  with ILD component f/u with Dr Marchelle Gearing ventolin inhaler CT stable 12/28/22  HLD:  on crestor LDL at goal 67 Memory:  on both namenda and aricept  Depression :  continue lexapro  HTN:  continue Hyzaar low sodium diet and weight loss  Dysphagia:  primary should refer to GI for endoscopy and likely dilatation of esophagus F/U Dr Christella Hartigan   F/U in a year   Signed: Charlton Haws 07/20/2023, 2:02 PM

## 2023-07-26 ENCOUNTER — Ambulatory Visit: Payer: BC Managed Care – PPO | Admitting: Cardiovascular Disease

## 2023-09-28 DIAGNOSIS — L409 Psoriasis, unspecified: Secondary | ICD-10-CM | POA: Diagnosis not present

## 2023-10-19 DIAGNOSIS — R051 Acute cough: Secondary | ICD-10-CM | POA: Diagnosis not present

## 2023-10-19 DIAGNOSIS — J020 Streptococcal pharyngitis: Secondary | ICD-10-CM | POA: Diagnosis not present

## 2023-10-26 DIAGNOSIS — L409 Psoriasis, unspecified: Secondary | ICD-10-CM | POA: Diagnosis not present

## 2023-11-09 NOTE — Progress Notes (Signed)
 CARDIOLOGY CONSULT NOTE       Patient ID: Richard Moore MRN: [id number] DOB/AGE: 1961/08/15 63 y.o.  Referring Physician: Daphine Deutscher Primary Physician: Bennie Pierini, FNP Primary Cardiologist: New Reason for Consultation: CAD   HPI:  63 y.o. referred by Dr Daphine Deutscher on 05/09/21 for chest pain and calcium seen on CT scan History of COPD, GERD, HLD, HTN, OSA with oral appliance use Sees Dr Marchelle Gearing for pulmonary with chronic cough and some exertional dyspnea CT in 2019 noted nonspecific pneumonitis and also commented on aortic atherosclerosis and coronary calcification ? Early ILD and 4 mm nodule PFTls with FEV183% mild restriction no dilator response and DLCO 85% Quit smoking about 13 years ago   TTE done 11/29/20 EF 60-65% normal diastolic parameters normal RV no significant valve disease or pulmonary HTN and aortic root 3.8 cm   CT 11/29/20 showed moderate calcification of the mid/distal LAD and mild calcification in the proximal circumflex  Cardiac CTA reviewed 05/16/21 calcium score 802 96 th percentile Left dominant Cors tightest lesion D3/IOM  50-69% FFR CT with normal values D3 0.84 and OM2 0.86  His wife is a Engineer, civil (consulting)  Two older children and one 3 rd year college student at [employer] who wants to get into radiology Has worked in Engineer, 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 Still with ETOH Has lost about 30 lbs Working a lot and golfing some. Repeat BP by me 165/95  ROS All other systems reviewed and negative except as noted above  Past Medical History:  Diagnosis Date   COPD (chronic obstructive pulmonary disease) (HCC)    GERD (gastroesophageal reflux disease)    Hyperlipidemia    Hypertension    Pneumonia    Sleep  apnea    doesn't wear CPAP- uses mouth piece now    Family History  Problem Relation Age of Onset   Lung cancer Father        23 years ago   Colon cancer Neg Hx    Esophageal cancer Neg Hx    Rectal cancer Neg Hx    Stomach cancer Neg Hx     Social History   Socioeconomic History   Marital status: Married    Spouse name: Not on file   Number of children: Not on file   Years of education: Not on file   Highest education level: Not on file  Occupational History   Occupation: put in Administrator    Employer: [employer]  Tobacco Use   Smoking status: Former    Current packs/day: 0.00    Average packs/day: 2.0 packs/day for 15.0 years (30.0 ttl pk-yrs)    Types: Cigarettes    Start date: 09/14/1992    Quit date: 09/15/2007    Years since quitting: 16.1   Smokeless tobacco: Never  Vaping Use   Vaping status: Never Used  Substance and Sexual Activity   Alcohol use: Yes    Alcohol/week: 6.0 standard drinks of alcohol    Types: 6 Standard drinks or equivalent per week    Comment: occasionally   Drug use: No   Sexual activity: Not on file  Other Topics Concern   Not on file  Social History Narrative   Not  on file   Social Drivers of Health   Financial Resource Strain: Not on file  Food Insecurity: Not on file  Transportation Needs: Not on file  Physical Activity: Not on file  Stress: Not on file  Social Connections: Not on file  Intimate Partner Violence: Not on file    Past Surgical History:  Procedure Laterality Date   COLONOSCOPY     INGUINAL HERNIA REPAIR     right   VASECTOMY        Current Outpatient Medications:    albuterol (VENTOLIN HFA) 108 (90 Base) MCG/ACT inhaler, Inhale 2 puffs into the lungs every 6 (six) hours as needed for wheezing or shortness of breath., Disp: 18 g, Rfl: 6   donepezil (ARICEPT) 10 MG tablet, Take 1 tablet (10 mg total) by mouth at bedtime., Disp: 14 tablet, Rfl: 0   escitalopram (LEXAPRO) 10 MG tablet, Take 1 tablet (10 mg  total) by mouth daily., Disp: 90 tablet, Rfl: 1   ezetimibe (ZETIA) 10 MG tablet, Take 1 tablet (10 mg total) by mouth daily., Disp: 14 tablet, Rfl: 1   losartan-hydrochlorothiazide (HYZAAR) 100-25 MG tablet, Take 1 tablet by mouth daily., Disp: 14 tablet, Rfl: 0   nitroGLYCERIN (NITROSTAT) 0.4 MG SL tablet, Place 1 tablet (0.4 mg total) under the tongue every 5 (five) minutes as needed for chest pain., Disp: 25 tablet, Rfl: 3   pantoprazole (PROTONIX) 40 MG tablet, Take 1 tablet (40 mg total) by mouth daily., Disp: 30 tablet, Rfl: 3   rosuvastatin (CRESTOR) 10 MG tablet, Take 1 tablet (10 mg total) by mouth daily., Disp: 90 tablet, Rfl: 1   Tiotropium Bromide-Olodaterol (STIOLTO RESPIMAT) 2.5-2.5 MCG/ACT AERS, Inhale 2 puffs into the lungs daily., Disp: 1 each, Rfl: 12   triamcinolone cream (KENALOG) 0.1 %, Apply 1 Application topically 2 (two) times daily., Disp: 453 g, Rfl: 0  Current Facility-Administered Medications:    0.9 %  sodium chloride infusion, 500 mL, Intravenous, Continuous, Rachael Fee, MD   sodium chloride      Physical Exam: Blood pressure (!) 148/90, pulse 72, height 6\' 3"  (1.905 m), weight 251 lb (113.9 kg), SpO2 95%.    Affect appropriate Chronically ill COPD  HEENT: normal Neck supple with no adenopathy JVP normal no bruits no thyromegaly Lungs clear with no wheezing and good diaphragmatic motion Heart:  S1/S2 no murmur, no rub, gallop or click PMI normal Abdomen: benighn, BS positve, no tenderness, no AAA no bruit.  No HSM or HJR Distal pulses intact with no bruits No edema Neuro non-focal Skin warm and dry No muscular weakness   Labs:   Lab Results  Component Value Date   WBC 5.7 03/26/2023   HGB 14.7 03/26/2023   HCT 43.2 03/26/2023   MCV 92 03/26/2023   PLT 126 (L) 03/26/2023   No results for input(s): "NA", "K", "CL", "CO2", "BUN", "CREATININE", "CALCIUM", "PROT", "BILITOT", "ALKPHOS", "ALT", "AST", "GLUCOSE" in the last 168  hours.  Invalid input(s): "LABALBU" No results found for: "CKTOTAL", "CKMB", "CKMBINDEX", "TROPONINI"  Lab Results  Component Value Date   CHOL 193 03/26/2023   CHOL 150 08/11/2021   CHOL 135 12/13/2020   Lab Results  Component Value Date   HDL 38 (L) 03/26/2023   HDL 38 (L) 08/11/2021   HDL 44 12/13/2020   Lab Results  Component Value Date   LDLCALC 95 03/26/2023   LDLCALC 84 08/11/2021   LDLCALC 67 12/13/2020   Lab Results  Component Value Date  TRIG 360 (H) 03/26/2023   TRIG 159 (H) 08/11/2021   TRIG 138 12/13/2020   Lab Results  Component Value Date   CHOLHDL 5.1 (H) 03/26/2023   CHOLHDL 3.9 08/11/2021   CHOLHDL 3.1 12/13/2020   No results found for: "LDLDIRECT"    Radiology: No results found.  EKG: SR rate 62 RSR'  2019  11/18/2023 SR rate 64 normal , 11/18/2023 SR rate 72 normal    ASSESSMENT AND PLAN:   CAD:  high calcium score for age CTA with moderate OM/D3 lesions with negative FFR continue medical Rx ETT to screen for progression of dx COPD:  with ILD component f/u with Dr Marchelle Gearing ventolin inhaler HLD:  on crestor LDL at goal 33 Memory:  on both namenda and aricept  Depression :  continue lexapro  HTN:  continue Hyzaar low sodium diet add Norvasc 5 mg and monitor at home  Dysphagia:  primary should refer to GI for endoscopy and likely dilatation of esophagus F/U Dr Christella Hartigan   F/U in 3 months  Signed: Charlton Haws 11/18/2023, 8:36 AM

## 2023-11-18 ENCOUNTER — Ambulatory Visit: Payer: BC Managed Care – PPO | Attending: Cardiovascular Disease | Admitting: Cardiovascular Disease

## 2023-11-18 ENCOUNTER — Ambulatory Visit: Payer: BC Managed Care – PPO | Admitting: Internal Medicine

## 2023-11-18 ENCOUNTER — Encounter: Payer: Self-pay | Admitting: Cardiovascular Disease

## 2023-11-18 VITALS — BP 148/90 | HR 72 | Ht 75.0 in | Wt 251.0 lb

## 2023-11-18 DIAGNOSIS — I10 Essential (primary) hypertension: Secondary | ICD-10-CM

## 2023-11-18 DIAGNOSIS — E782 Mixed hyperlipidemia: Secondary | ICD-10-CM | POA: Diagnosis not present

## 2023-11-18 DIAGNOSIS — I2510 Atherosclerotic heart disease of native coronary artery without angina pectoris: Secondary | ICD-10-CM | POA: Diagnosis not present

## 2023-11-18 DIAGNOSIS — R931 Abnormal findings on diagnostic imaging of heart and coronary circulation: Secondary | ICD-10-CM | POA: Diagnosis not present

## 2023-11-18 MED ORDER — AMLODIPINE BESYLATE 5 MG PO TABS: 5.0000 mg | ORAL_TABLET | Freq: Every day | ORAL | 3 refills | Status: DC

## 2023-11-18 NOTE — Patient Instructions (Signed)
 Medication Instructions:  Your physician has recommended you make the following change in your medication:   Start Norvasc 5 mg Daily   *If you need a refill on your cardiac medications before your next appointment, please call your pharmacy*   Lab Work: NONE   If you have labs (blood work) drawn today and your tests are completely normal, you will receive your results only by: MyChart Message (if you have MyChart) OR A paper copy in the mail If you have any lab test that is abnormal or we need to change your treatment, we will call you to review the results.   Testing/Procedures: Your physician has requested that you have an exercise tolerance test. For further information please visit [email or web address] Please also follow instruction sheet, as given.    Follow-Up: At [facility], you and your health needs are our priority.  As part of our continuing mission to provide you with exceptional heart care, we have created designated Provider Care Teams.  These Care Teams include your primary Cardiologist (physician) and Advanced Practice Providers (APPs -  Physician Assistants and Nurse Practitioners) who all work together to provide you with the care you need, when you need it.  We recommend signing up for the patient portal called "MyChart".  Sign up information is provided on this After Visit Summary.  MyChart is used to connect with patients for Virtual Visits (Telemedicine).  Patients are able to view lab/test results, encounter notes, upcoming appointments, etc.  Non-urgent messages can be sent to your provider as well.   To learn more about what you can do with MyChart, go to [email or web address].    Your next appointment:   3 month(s)  Provider:   You may see Charlton Haws, MD or one of the following Advanced Practice Providers on your designated Care Team:   Randall An, PA-C  Jacolyn Reedy, PA-C     Other Instructions Thank you for choosing [facility]!

## 2023-11-23 ENCOUNTER — Ambulatory Visit (HOSPITAL_COMMUNITY)
Admission: RE | Admit: 2023-11-23 | Discharge: 2023-11-23 | Disposition: A | Discharge status: Home / Self Care | Source: Ambulatory Visit | Attending: Cardiovascular Disease | Admitting: Cardiovascular Disease

## 2023-11-23 DIAGNOSIS — I2510 Atherosclerotic heart disease of native coronary artery without angina pectoris: Secondary | ICD-10-CM | POA: Diagnosis not present

## 2023-11-23 LAB — EXERCISE TOLERANCE TEST
Angina Index: 0
Duke Treadmill Score: 6
Estimated workload: 7.4
Exercise duration (min): 6 min
Exercise duration (sec): 18 s
MPHR: 159 {beats}/min
Peak HR: 139 {beats}/min
Percent HR: 87 %
RPE: 15
Rest HR: 65 {beats}/min
ST Depression (mm): 0 mm

## 2023-12-06 ENCOUNTER — Encounter: Payer: Self-pay | Admitting: Nurse Practitioner

## 2023-12-06 ENCOUNTER — Ambulatory Visit (INDEPENDENT_AMBULATORY_CARE_PROVIDER_SITE_OTHER): Payer: BC Managed Care – PPO | Admitting: Nurse Practitioner

## 2023-12-06 VITALS — BP 116/73 | HR 91 | Temp 97.7°F | Ht 75.0 in | Wt 250.0 lb

## 2023-12-06 DIAGNOSIS — R739 Hyperglycemia, unspecified: Secondary | ICD-10-CM | POA: Diagnosis not present

## 2023-12-06 DIAGNOSIS — F419 Anxiety disorder, unspecified: Secondary | ICD-10-CM | POA: Diagnosis not present

## 2023-12-06 DIAGNOSIS — E785 Hyperlipidemia, unspecified: Secondary | ICD-10-CM | POA: Diagnosis not present

## 2023-12-06 DIAGNOSIS — R413 Other amnesia: Secondary | ICD-10-CM

## 2023-12-06 DIAGNOSIS — K2100 Gastro-esophageal reflux disease with esophagitis, without bleeding: Secondary | ICD-10-CM

## 2023-12-06 DIAGNOSIS — J439 Emphysema, unspecified: Secondary | ICD-10-CM | POA: Diagnosis not present

## 2023-12-06 DIAGNOSIS — Z6830 Body mass index (BMI) 30.0-30.9, adult: Secondary | ICD-10-CM

## 2023-12-06 DIAGNOSIS — I10 Essential (primary) hypertension: Secondary | ICD-10-CM

## 2023-12-06 DIAGNOSIS — J441 Chronic obstructive pulmonary disease with (acute) exacerbation: Secondary | ICD-10-CM

## 2023-12-06 LAB — LIPID PANEL

## 2023-12-06 LAB — BAYER DCA HB A1C WAIVED: HB A1C (BAYER DCA - WAIVED): 5.5 % (ref 4.8–5.6)

## 2023-12-06 MED ORDER — ESCITALOPRAM OXALATE 10 MG PO TABS: 10.0000 mg | ORAL_TABLET | Freq: Every day | ORAL | 1 refills | Status: DC

## 2023-12-06 MED ORDER — AMLODIPINE BESYLATE 5 MG PO TABS
5.0000 mg | ORAL_TABLET | Freq: Every day | ORAL | 1 refills | Status: DC
Start: 2023-12-06 — End: 2024-07-07

## 2023-12-06 MED ORDER — EZETIMIBE 10 MG PO TABS: 10.0000 mg | ORAL_TABLET | Freq: Every day | ORAL | 1 refills | Status: DC

## 2023-12-06 MED ORDER — STIOLTO RESPIMAT 2.5-2.5 MCG/ACT IN AERS
2.0000 | INHALATION_SPRAY | Freq: Every day | RESPIRATORY_TRACT | 12 refills | Status: AC
Start: 2023-12-06 — End: 2024-12-06

## 2023-12-06 MED ORDER — PANTOPRAZOLE SODIUM 40 MG PO TBEC: 40.0000 mg | DELAYED_RELEASE_TABLET | Freq: Every day | ORAL | 1 refills | Status: DC

## 2023-12-06 MED ORDER — DONEPEZIL HCL 10 MG PO TABS: 10.0000 mg | ORAL_TABLET | Freq: Every day | ORAL | 1 refills | Status: DC

## 2023-12-06 NOTE — Progress Notes (Signed)
 Subjective:    Patient ID: Richard Moore, male    DOB: [birth year], 63 y.o.   MRN: [id number]   Chief Complaint: medical management of chronic issues     HPI:  Richard Moore is a 63 y.o. who identifies as a male who was assigned male at birth.   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 Elevated blood sugar Does not check blood sugar at home except occasionally. He is currently doing diet control. Lab Results  Component Value Date   HGBA1C 5.8 (H) 06/10/2023     2. Essential hypertension No c/o chest pain, sob or headache. Does not check blood pressure at home. BP Readings from Last 3 Encounters:  11/18/23 (!) 148/90  06/10/23 (!) 144/78  04/01/23 (!) 167/92     3. Hyperlipidemia with target LDL less than 100 Is on zetia and crestor. Does not watch diet and does no exercise. Lab Results  Component Value Date   CHOL 193 03/26/2023   HDL 38 (L) 03/26/2023   LDLCALC 95 03/26/2023   TRIG 360 (H) 03/26/2023   CHOLHDL 5.1 (H) 03/26/2023     4. Pulmonary emphysema, unspecified emphysema type (HCC) Is on stiolto daily and is doing wel.denies cough and wheezing.  5. Anxiety Is on lexapro and is doing well    06/10/2023   10:17 AM 04/01/2023    9:19 AM 08/11/2021    8:53 AM 12/13/2020    9:05 AM  GAD 7 : Generalized Anxiety Score  Nervous, Anxious, on Edge 0 0 0 2  Control/stop worrying 0 0 0 1  Worry too much - different things 0 0 0 1  Trouble relaxing 3 3 3 1   Restless 0 0 0 0  Easily annoyed or irritable 3 3 3 2   Afraid - awful might happen 0 0 0 3  Total GAD 7 Score 6 6 6 10   Anxiety Difficulty Not difficult at all Somewhat difficult Somewhat difficult Not difficult at all        6. Memory disturbance Is on aricept. He says he is doing well.  7. BMI 30.0-30.9,adult Weight is down 13lbs Wt Readings from Last 3  Encounters:  12/06/23 250 lb (113.4 kg)  11/18/23 251 lb (113.9 kg)  06/10/23 252 lb (114.3 kg)   BMI Readings from Last 3 Encounters:  12/06/23 31.25 kg/m  11/18/23 31.37 kg/m  06/10/23 31.50 kg/m       New complaints: None today  No Known Allergies Outpatient Encounter Medications as of 12/06/2023  Medication Sig   albuterol (VENTOLIN HFA) 108 (90 Base) MCG/ACT inhaler Inhale 2 puffs into the lungs every 6 (six) hours as needed for wheezing or shortness of breath.   amLODipine (NORVASC) 5 MG tablet Take 1 tablet (5 mg total) by mouth daily.   donepezil (ARICEPT) 10 MG tablet Take 1 tablet (10 mg total) by mouth at bedtime.   escitalopram (LEXAPRO) 10 MG tablet Take 1 tablet (10 mg total) by mouth daily.   ezetimibe (ZETIA) 10 MG tablet Take 1 tablet (10 mg total) by mouth daily.   losartan-hydrochlorothiazide (HYZAAR) 100-25 MG tablet Take 1 tablet by mouth daily.   nitroGLYCERIN (NITROSTAT) 0.4 MG SL tablet Place 1 tablet (0.4 mg total) under the tongue every 5 (five) minutes as needed for chest pain.   pantoprazole (PROTONIX) 40 MG tablet Take 1 tablet (40 mg  total) by mouth daily.   rosuvastatin (CRESTOR) 10 MG tablet Take 1 tablet (10 mg total) by mouth daily.   Tiotropium Bromide-Olodaterol (STIOLTO RESPIMAT) 2.5-2.5 MCG/ACT AERS Inhale 2 puffs into the lungs daily.   triamcinolone cream (KENALOG) 0.1 % Apply 1 Application topically 2 (two) times daily.   Facility-Administered Encounter Medications as of 12/06/2023  Medication   0.9 %  sodium chloride infusion    Past Surgical History:  Procedure Laterality Date   COLONOSCOPY     INGUINAL HERNIA REPAIR     right   VASECTOMY      Family History  Problem Relation Age of Onset   Lung cancer Father        23 years ago   Colon cancer Neg Hx    Esophageal cancer Neg Hx    Rectal cancer Neg Hx    Stomach cancer Neg Hx       Controlled substance contract: n/a     Review of Systems  Constitutional:   Negative for diaphoresis.  Eyes:  Negative for pain.  Respiratory:  Negative for shortness of breath.   Cardiovascular:  Negative for chest pain, palpitations and leg swelling.  Gastrointestinal:  Negative for abdominal pain.  Endocrine: Negative for polydipsia.  Skin:  Negative for rash.  Neurological:  Negative for dizziness, weakness and headaches.  Hematological:  Does not bruise/bleed easily.  All other systems reviewed and are negative.      Objective:   Physical Exam Vitals and nursing note reviewed.  Constitutional:      Appearance: Normal appearance. He is well-developed.  HENT:     Head: Normocephalic.     Nose: Nose normal.     Mouth/Throat:     Mouth: Mucous membranes are moist.     Pharynx: Oropharynx is clear.  Eyes:     Pupils: Pupils are equal, round, and reactive to light.  Neck:     Thyroid: No thyroid mass or thyromegaly.     Vascular: No carotid bruit or JVD.     Trachea: Phonation normal.  Cardiovascular:     Rate and Rhythm: Normal rate and regular rhythm.  Pulmonary:     Effort: Pulmonary effort is normal. No respiratory distress.     Breath sounds: Normal breath sounds.  Abdominal:     General: Bowel sounds are normal.     Palpations: Abdomen is soft.     Tenderness: There is no abdominal tenderness.  Musculoskeletal:        General: Normal range of motion.     Cervical back: Normal range of motion and neck supple.  Lymphadenopathy:     Cervical: No cervical adenopathy.  Skin:    General: Skin is warm and dry.     Comments: Hands cracked with flaky dry rash on palms  Neurological:     Mental Status: He is alert and oriented to person, place, and time.  Psychiatric:        Behavior: Behavior normal.        Thought Content: Thought content normal.        Judgment: Judgment normal.     BP 116/73   Pulse 91   Temp 97.7 F (36.5 C) (Temporal)   Ht 6\' 3"  (1.905 m)   Wt 250 lb (113.4 kg)   SpO2 100%   BMI 31.25 kg/m    HGBa1c  5.5%     Assessment & Plan:   [name] in today with chief complaint of medical management of  chronic issues    1. Elevated blood sugar Continue to avoid soft drinks - Bayer DCA Hb A1c Waived  2. Essential hypertension Low sodium diet  3. Hyperlipidemia with target LDL less than 100 Low fat diet  4. Pulmonary emphysema, unspecified emphysema type (HCC) Continue stilotio  5. Anxiety Stress management  6. Memory disturbance Daily orientation  7. BMI 30.0-30.9,adult Discussed diet and exercise for person with BMI >25 Will recheck weight in 3-6 months   8. Psoriasis - triamcinolone cream (KENALOG) 0.1 %; Apply 1 Application topically 2 (two) times daily.  Dispense: 453 g; Refill: 0  9. COPD with acute exacerbation (HCC) - albuterol (VENTOLIN HFA) 108 (90 Base) MCG/ACT inhaler; Inhale 2 puffs into the lungs every 6 (six) hours as needed for wheezing or shortness of breath.  Dispense: 18 g; Refill: 6  Meds ordered this encounter  Medications   escitalopram (LEXAPRO) 10 MG tablet    Sig: Take 1 tablet (10 mg total) by mouth daily.    Dispense:  90 tablet    Refill:  1    Supervising Provider:   Arville Care A [[id number]]   Tiotropium Bromide-Olodaterol (STIOLTO RESPIMAT) 2.5-2.5 MCG/ACT AERS    Sig: Inhale 2 puffs into the lungs daily.    Dispense:  1 each    Refill:  12    Supervising Provider:   Arville Care A [[id number]]   pantoprazole (PROTONIX) 40 MG tablet    Sig: Take 1 tablet (40 mg total) by mouth daily.    Dispense:  90 tablet    Refill:  1    Supervising Provider:   Arville Care A [[id number]]   ezetimibe (ZETIA) 10 MG tablet    Sig: Take 1 tablet (10 mg total) by mouth daily.    Dispense:  90 tablet    Refill:  1    Supervising Provider:   Arville Care A [[id number]]   donepezil (ARICEPT) 10 MG tablet    Sig: Take 1 tablet (10 mg total) by mouth at bedtime.    Dispense:  90 tablet    Refill:  1    Supervising Provider:    Arville Care A [[id number]]   amLODipine (NORVASC) 5 MG tablet    Sig: Take 1 tablet (5 mg total) by mouth daily.    Dispense:  90 tablet    Refill:  1    Supervising Provider:   Arville Care A [[id number]]     The above assessment and management plan was discussed with the patient. The patient verbalized understanding of and has agreed to the management plan. Patient is aware to call the clinic if symptoms persist or worsen. Patient is aware when to return to the clinic for a follow-up visit. Patient educated on when it is appropriate to go to the emergency department.   Mary-Margaret Daphine Deutscher, FNP

## 2023-12-06 NOTE — Patient Instructions (Signed)

## 2023-12-07 LAB — CMP14+EGFR
ALT: 33 IU/L (ref 0–44)
AST: 27 IU/L (ref 0–40)
Albumin: 4 g/dL (ref 3.9–4.9)
Alkaline Phosphatase: 43 IU/L — ABNORMAL LOW (ref 44–121)
BUN/Creatinine Ratio: 22 (ref 10–24)
BUN: 17 mg/dL (ref 8–27)
Bilirubin Total: 0.3 mg/dL (ref 0.0–1.2)
CO2: 22 mmol/L (ref 20–29)
Calcium: 8.4 mg/dL — ABNORMAL LOW (ref 8.6–10.2)
Chloride: 108 mmol/L — ABNORMAL HIGH (ref 96–106)
Creatinine, Ser: 0.78 mg/dL (ref 0.76–1.27)
Globulin, Total: 1.8 g/dL (ref 1.5–4.5)
Glucose: 107 mg/dL — ABNORMAL HIGH (ref 70–99)
Potassium: 4.2 mmol/L (ref 3.5–5.2)
Sodium: 143 mmol/L (ref 134–144)
Total Protein: 5.8 g/dL — ABNORMAL LOW (ref 6.0–8.5)
eGFR: 101 mL/min/{1.73_m2} (ref >59)

## 2023-12-07 LAB — CBC WITH DIFFERENTIAL/PLATELET
Basophils Absolute: 0 10*3/uL (ref 0.0–0.2)
Basos: 1 %
EOS (ABSOLUTE): 0.1 10*3/uL (ref 0.0–0.4)
Eos: 2 %
Hematocrit: 43.3 % (ref 37.5–51.0)
Hemoglobin: 14.6 g/dL (ref 13.0–17.7)
Immature Grans (Abs): 0 10*3/uL (ref 0.0–0.1)
Immature Granulocytes: 0 %
Lymphocytes Absolute: 1.3 10*3/uL (ref 0.7–3.1)
Lymphs: 33 %
MCH: 31.1 pg (ref 26.6–33.0)
MCHC: 33.7 g/dL (ref 31.5–35.7)
MCV: 92 fL (ref 79–97)
Monocytes Absolute: 0.5 10*3/uL (ref 0.1–0.9)
Monocytes: 12 %
Neutrophils Absolute: 2.1 10*3/uL (ref 1.4–7.0)
Neutrophils: 52 %
Platelets: 129 10*3/uL — ABNORMAL LOW (ref 150–450)
RBC: 4.7 x10E6/uL (ref 4.14–5.80)
RDW: 13.4 % (ref 11.6–15.4)
WBC: 4 10*3/uL (ref 3.4–10.8)

## 2023-12-07 LAB — LIPID PANEL
Cholesterol, Total: 154 mg/dL (ref 100–199)
HDL: 42 mg/dL (ref >39)
LDL CALC COMMENT:: 3.7 ratio (ref 0.0–5.0)
LDL Chol Calc (NIH): 80 mg/dL (ref 0–99)
Triglycerides: 186 mg/dL — ABNORMAL HIGH (ref 0–149)
VLDL Cholesterol Cal: 32 mg/dL (ref 5–40)

## 2023-12-21 ENCOUNTER — Other Ambulatory Visit: Payer: Self-pay | Admitting: Nurse Practitioner

## 2023-12-21 DIAGNOSIS — I10 Essential (primary) hypertension: Secondary | ICD-10-CM

## 2023-12-31 ENCOUNTER — Other Ambulatory Visit: Payer: Self-pay

## 2023-12-31 ENCOUNTER — Observation Stay (HOSPITAL_COMMUNITY)
Admission: EM | Admit: 2023-12-31 | Discharge: 2024-01-02 | Disposition: A | Discharge status: Home / Self Care | Attending: Family Medicine | Admitting: Family Medicine

## 2023-12-31 ENCOUNTER — Emergency Department (HOSPITAL_COMMUNITY)

## 2023-12-31 ENCOUNTER — Encounter (HOSPITAL_COMMUNITY): Payer: Self-pay

## 2023-12-31 DIAGNOSIS — F419 Anxiety disorder, unspecified: Secondary | ICD-10-CM | POA: Diagnosis not present

## 2023-12-31 DIAGNOSIS — R55 Syncope and collapse: Secondary | ICD-10-CM | POA: Diagnosis not present

## 2023-12-31 DIAGNOSIS — E785 Hyperlipidemia, unspecified: Secondary | ICD-10-CM | POA: Diagnosis not present

## 2023-12-31 DIAGNOSIS — R413 Other amnesia: Secondary | ICD-10-CM | POA: Diagnosis present

## 2023-12-31 DIAGNOSIS — Z87891 Personal history of nicotine dependence: Secondary | ICD-10-CM | POA: Insufficient documentation

## 2023-12-31 DIAGNOSIS — E1169 Type 2 diabetes mellitus with other specified complication: Secondary | ICD-10-CM | POA: Diagnosis present

## 2023-12-31 DIAGNOSIS — R918 Other nonspecific abnormal finding of lung field: Secondary | ICD-10-CM | POA: Diagnosis not present

## 2023-12-31 DIAGNOSIS — J439 Emphysema, unspecified: Secondary | ICD-10-CM | POA: Diagnosis present

## 2023-12-31 DIAGNOSIS — E119 Type 2 diabetes mellitus without complications: Secondary | ICD-10-CM

## 2023-12-31 DIAGNOSIS — E876 Hypokalemia: Secondary | ICD-10-CM | POA: Diagnosis not present

## 2023-12-31 DIAGNOSIS — J449 Chronic obstructive pulmonary disease, unspecified: Secondary | ICD-10-CM | POA: Insufficient documentation

## 2023-12-31 DIAGNOSIS — F10929 Alcohol use, unspecified with intoxication, unspecified: Secondary | ICD-10-CM | POA: Diagnosis not present

## 2023-12-31 DIAGNOSIS — F1090 Alcohol use, unspecified, uncomplicated: Secondary | ICD-10-CM | POA: Insufficient documentation

## 2023-12-31 DIAGNOSIS — I10 Essential (primary) hypertension: Secondary | ICD-10-CM | POA: Diagnosis present

## 2023-12-31 LAB — CBC WITH DIFFERENTIAL/PLATELET
Abs Immature Granulocytes: 0.01 10*3/uL (ref 0.00–0.07)
Basophils Absolute: 0 10*3/uL (ref 0.0–0.1)
Basophils Relative: 1 %
Eosinophils Absolute: 0.1 10*3/uL (ref 0.0–0.5)
Eosinophils Relative: 1 %
HCT: 42.8 % (ref 39.0–52.0)
Hemoglobin: 14.8 g/dL (ref 13.0–17.0)
Immature Granulocytes: 0 %
Lymphocytes Relative: 26 %
Lymphs Abs: 1.3 10*3/uL (ref 0.7–4.0)
MCH: 31 pg (ref 26.0–34.0)
MCHC: 34.6 g/dL (ref 30.0–36.0)
MCV: 89.5 fL (ref 80.0–100.0)
Monocytes Absolute: 0.3 10*3/uL (ref 0.1–1.0)
Monocytes Relative: 6 %
Neutro Abs: 3.3 10*3/uL (ref 1.7–7.7)
Neutrophils Relative %: 66 %
Platelets: 122 10*3/uL — ABNORMAL LOW (ref 150–400)
RBC: 4.78 MIL/uL (ref 4.22–5.81)
RDW: 12.6 % (ref 11.5–15.5)
WBC: 5 10*3/uL (ref 4.0–10.5)
nRBC: 0 % (ref 0.0–0.2)

## 2023-12-31 LAB — BASIC METABOLIC PANEL WITH GFR
Anion gap: 9 (ref 5–15)
BUN: 12 mg/dL (ref 8–23)
CO2: 25 mmol/L (ref 22–32)
Calcium: 9 mg/dL (ref 8.9–10.3)
Chloride: 106 mmol/L (ref 98–111)
Creatinine, Ser: 0.91 mg/dL (ref 0.61–1.24)
GFR, Estimated: >60 mL/min (ref >60)
Glucose, Bld: 147 mg/dL — ABNORMAL HIGH (ref 70–99)
Potassium: 3.2 mmol/L — ABNORMAL LOW (ref 3.5–5.1)
Sodium: 140 mmol/L (ref 135–145)

## 2023-12-31 LAB — TROPONIN I (HIGH SENSITIVITY)
Troponin I (High Sensitivity): <2 ng/L (ref <18)
Troponin I (High Sensitivity): <2 ng/L (ref <18)

## 2023-12-31 LAB — ETHANOL: Alcohol, Ethyl (B): <10 mg/dL (ref <10)

## 2023-12-31 MED ORDER — HYDROCHLOROTHIAZIDE 25 MG PO TABS
25.0000 mg | ORAL_TABLET | Freq: Every day | ORAL | Status: DC
  Administered 2024-01-01 – 2024-01-02 (×2): 25 mg via ORAL
  Filled 2023-12-31 (×2): qty 1

## 2023-12-31 MED ORDER — ENOXAPARIN SODIUM 40 MG/0.4ML IJ SOSY
40.0000 mg | PREFILLED_SYRINGE | INTRAMUSCULAR | Status: DC
Start: 2023-12-31 — End: 2023-12-31

## 2023-12-31 MED ORDER — ESCITALOPRAM OXALATE 10 MG PO TABS
10.0000 mg | ORAL_TABLET | Freq: Every day | ORAL | Status: DC
  Administered 2024-01-01 – 2024-01-02 (×2): 10 mg via ORAL
  Filled 2023-12-31 (×2): qty 1

## 2023-12-31 MED ORDER — SODIUM CHLORIDE 0.9 % IV SOLN: 250.0000 mL | INTRAVENOUS | Status: AC | PRN

## 2023-12-31 MED ORDER — DONEPEZIL HCL 10 MG PO TABS: 10.0000 mg | ORAL_TABLET | Freq: Every day | ORAL | Status: DC

## 2023-12-31 MED ORDER — ACETAMINOPHEN 325 MG PO TABS
650.0000 mg | ORAL_TABLET | Freq: Four times a day (QID) | ORAL | Status: DC | PRN
  Administered 2024-01-01: 650 mg via ORAL
  Filled 2023-12-31: qty 2

## 2023-12-31 MED ORDER — ONDANSETRON HCL 4 MG PO TABS: 4.0000 mg | ORAL_TABLET | Freq: Four times a day (QID) | ORAL | Status: DC | PRN

## 2023-12-31 MED ORDER — POTASSIUM CHLORIDE CRYS ER 20 MEQ PO TBCR
40.0000 meq | EXTENDED_RELEASE_TABLET | Freq: Once | ORAL | Status: AC
  Administered 2023-12-31: 40 meq via ORAL
  Filled 2023-12-31: qty 2

## 2023-12-31 MED ORDER — ARFORMOTEROL TARTRATE 15 MCG/2ML IN NEBU
15.0000 ug | INHALATION_SOLUTION | Freq: Two times a day (BID) | RESPIRATORY_TRACT | Status: DC
  Administered 2024-01-01 – 2024-01-02 (×2): 15 ug via RESPIRATORY_TRACT
  Filled 2023-12-31 (×3): qty 2

## 2023-12-31 MED ORDER — PANTOPRAZOLE SODIUM 40 MG PO TBEC
40.0000 mg | DELAYED_RELEASE_TABLET | Freq: Every day | ORAL | Status: DC
  Administered 2024-01-01 – 2024-01-02 (×2): 40 mg via ORAL
  Filled 2023-12-31: qty 1
  Filled 2023-12-31: qty 2

## 2023-12-31 MED ORDER — ENOXAPARIN SODIUM 60 MG/0.6ML IJ SOSY
50.0000 mg | PREFILLED_SYRINGE | INTRAMUSCULAR | Status: DC
Start: 2023-12-31 — End: 2024-01-02
  Administered 2023-12-31 – 2024-01-01 (×2): 50 mg via SUBCUTANEOUS
  Filled 2023-12-31 (×2): qty 0.6

## 2023-12-31 MED ORDER — TRIAMCINOLONE ACETONIDE 0.1 % EX CREA
1.0000 | TOPICAL_CREAM | Freq: Every day | CUTANEOUS | Status: DC
  Administered 2024-01-01: 1 via TOPICAL
  Filled 2023-12-31: qty 15

## 2023-12-31 MED ORDER — UMECLIDINIUM BROMIDE 62.5 MCG/ACT IN AEPB
1.0000 | INHALATION_SPRAY | Freq: Every day | RESPIRATORY_TRACT | Status: DC
  Administered 2024-01-02: 1 via RESPIRATORY_TRACT
  Filled 2023-12-31: qty 7

## 2023-12-31 MED ORDER — AMLODIPINE BESYLATE 5 MG PO TABS
5.0000 mg | ORAL_TABLET | Freq: Every day | ORAL | Status: DC
  Administered 2024-01-01 – 2024-01-02 (×2): 5 mg via ORAL
  Filled 2023-12-31 (×2): qty 1

## 2023-12-31 MED ORDER — ACETAMINOPHEN 650 MG RE SUPP: 650.0000 mg | Freq: Four times a day (QID) | RECTAL | Status: DC | PRN

## 2023-12-31 MED ORDER — SENNOSIDES-DOCUSATE SODIUM 8.6-50 MG PO TABS: 1.0000 | ORAL_TABLET | Freq: Every evening | ORAL | Status: DC | PRN

## 2023-12-31 MED ORDER — SODIUM CHLORIDE 0.9% FLUSH
3.0000 mL | Freq: Two times a day (BID) | INTRAVENOUS | Status: DC
  Administered 2024-01-01: 3 mL via INTRAVENOUS

## 2023-12-31 MED ORDER — SODIUM CHLORIDE 0.9% FLUSH
3.0000 mL | INTRAVENOUS | Status: DC | PRN
  Administered 2024-01-01: 3 mL via INTRAVENOUS

## 2023-12-31 MED ORDER — ONDANSETRON HCL 4 MG/2ML IJ SOLN: 4.0000 mg | Freq: Four times a day (QID) | INTRAMUSCULAR | Status: DC | PRN

## 2023-12-31 MED ORDER — SODIUM CHLORIDE 0.9% FLUSH
3.0000 mL | Freq: Two times a day (BID) | INTRAVENOUS | Status: DC
  Administered 2024-01-01 (×2): 3 mL via INTRAVENOUS

## 2023-12-31 MED ORDER — LOSARTAN POTASSIUM 50 MG PO TABS
100.0000 mg | ORAL_TABLET | Freq: Every day | ORAL | Status: DC
  Administered 2024-01-01 – 2024-01-02 (×2): 100 mg via ORAL
  Filled 2023-12-31 (×2): qty 2

## 2023-12-31 MED ORDER — ROSUVASTATIN CALCIUM 5 MG PO TABS
10.0000 mg | ORAL_TABLET | Freq: Every day | ORAL | Status: DC
  Administered 2024-01-01 – 2024-01-02 (×2): 10 mg via ORAL
  Filled 2023-12-31 (×2): qty 2

## 2023-12-31 MED ORDER — LOSARTAN POTASSIUM-HCTZ 100-25 MG PO TABS: 1.0000 | ORAL_TABLET | Freq: Every day | ORAL | Status: DC

## 2023-12-31 NOTE — ED Triage Notes (Signed)
 Pt arrived via [facility] from home. Pt was ouside for 2 hrs eating and drank 2 bloody Mary's and had a syncopal episode. Per EMS, pt was pale and diaphretic on scene. EMS gave NS 300 ml.

## 2023-12-31 NOTE — ED Provider Triage Note (Signed)
 Emergency Medicine Provider Triage Evaluation Note  Richard Moore , a 63 y.o. male  was evaluated in triage.  Pt complains of syncope.  Patient was sitting outside enjoying a lengthy lunch.  He had consumed 2 Bloody Mary's.  He then had a syncopal event.  Currently he denies any complaint.  He feels much improved.  He denies associated chest pain or shortness of breath.  Review of Systems  Positive: Syncope Negative: Chest pain, shortness of breath  Physical Exam  BP 100/67   Pulse 69   Temp 97.8 F (36.6 C) (Oral)   Resp 17   Ht 6\' 3"  (1.905 m)   Wt 113.4 kg   SpO2 96%   BMI 31.25 kg/m  Gen:   Awake, no distress   Resp:  Normal effort  MSK:   Moves extremities without difficulty  Other:    Medical Decision Making  Medically screening exam initiated at 2:13 PM.  Appropriate orders placed.  Missael Ferrari was informed that the remainder of the evaluation will be completed by another provider, this initial triage assessment does not replace that evaluation, and the importance of remaining in the ED until their evaluation is complete.     Burnette Carte, MD 12/31/23 [phone]

## 2023-12-31 NOTE — ED Provider Notes (Signed)
 [facility] Provider Note   CSN: [id number] Arrival date & time: 12/31/23  1329     History  Chief Complaint  Patient presents with   Loss of Consciousness   HPI Richard Moore is a 63 y.o. male with past medical history hypertension, hyperlipidemia, emphysema, anxiety, elevated blood sugar presents due to concerns for syncope.  Patient had [place] airport with his [place], they then went out to eat lunch outside at a restaurant.  Stated that they were initiated area, EMT bloody Mary's and a burger, then felt lightheaded and clammy.  He subsequently had a syncopal episode with shaking and pallor, was slumped over in his chair and assisted to the ground.  Did not hit his head or injure himself in this process.  This lasted for less than a minute, then patient regained consciousness.  He denies any chest pain, shortness of breath, abdominal pain, severe headaches, visual changes, or back pain.  He does endorse feeling malaise now.   Loss of Consciousness      Home Medications Prior to Admission medications   Medication Sig Start Date End Date Taking? Authorizing Provider  albuterol  (VENTOLIN  HFA) 108 (90 Base) MCG/ACT inhaler Inhale 2 puffs into the lungs every 6 (six) hours as needed for wheezing or shortness of breath. 06/10/23   Gaylyn Keas Mary-Margaret, FNP  amLODipine  (NORVASC ) 5 MG tablet Take 1 tablet (5 mg total) by mouth daily. 12/06/23 03/05/24  Delfina Feller, FNP  donepezil  (ARICEPT ) 10 MG tablet Take 1 tablet (10 mg total) by mouth at bedtime. 12/06/23   Gaylyn Keas Mary-Margaret, FNP  escitalopram  (LEXAPRO ) 10 MG tablet Take 1 tablet (10 mg total) by mouth daily. 12/06/23   Gaylyn Keas Mary-Margaret, FNP  ezetimibe  (ZETIA ) 10 MG tablet Take 1 tablet (10 mg total) by mouth daily. 12/06/23   Delfina Feller, FNP  losartan -hydrochlorothiazide  (HYZAAR) 100-25 MG tablet TAKE 1 TABLET DAILY 12/21/23   Gaylyn Keas,  Mary-Margaret, FNP  nitroGLYCERIN  (NITROSTAT ) 0.4 MG SL tablet Place 1 tablet (0.4 mg total) under the tongue every 5 (five) minutes as needed for chest pain. 02/18/22 11/18/23  Loyde Rule, MD  pantoprazole  (PROTONIX ) 40 MG tablet Take 1 tablet (40 mg total) by mouth daily. 12/06/23   Gaylyn Keas, Mary-Margaret, FNP  rosuvastatin  (CRESTOR ) 10 MG tablet Take 1 tablet (10 mg total) by mouth daily. 04/01/23   Delfina Feller, FNP  Tiotropium Bromide-Olodaterol (STIOLTO RESPIMAT ) 2.5-2.5 MCG/ACT AERS Inhale 2 puffs into the lungs daily. 12/06/23 12/06/24  Delfina Feller, FNP  triamcinolone  cream (KENALOG ) 0.1 % Apply 1 Application topically 2 (two) times daily. 06/10/23   Delfina Feller, FNP      Allergies    Patient has no known allergies.    Review of Systems   Review of Systems  Cardiovascular:  Positive for syncope.    Physical Exam Updated Vital Signs BP 112/67   Pulse (!) 53   Temp 97.8 F (36.6 C) (Oral)   Resp 16   Ht 6\' 3"  (1.905 m)   Wt 113.4 kg   SpO2 99%   BMI 31.25 kg/m  Physical Exam Vitals and nursing note reviewed.  Constitutional:      General: He is not in acute distress.    Appearance: He is well-developed.  HENT:     Head: Normocephalic and atraumatic.  Eyes:     Conjunctiva/sclera: Conjunctivae normal.  Cardiovascular:     Rate and Rhythm: Normal rate and regular rhythm.  Pulses:          Radial pulses are 2+ on the right side and 2+ on the left side.     Heart sounds: No murmur heard. Pulmonary:     Effort: Pulmonary effort is normal. No respiratory distress.     Breath sounds: Normal breath sounds.  Abdominal:     Palpations: Abdomen is soft.     Tenderness: There is no abdominal tenderness. There is no guarding or rebound.  Musculoskeletal:        General: No swelling.     Cervical back: Neck supple.     Right lower leg: No edema.     Left lower leg: No edema.  Skin:    General: Skin is warm and dry.     Capillary Refill:  Capillary refill takes less than 2 seconds.  Neurological:     Mental Status: He is alert.     Cranial Nerves: Cranial nerves 2-12 are intact. No cranial nerve deficit, dysarthria or facial asymmetry.     Sensory: Sensation is intact.     Motor: Motor function is intact. No weakness, tremor, atrophy, abnormal muscle tone or pronator drift.     Coordination: Coordination is intact. Coordination normal. Finger-Nose-Finger Test normal.     Gait: Gait is intact.  Psychiatric:        Mood and Affect: Mood normal.     ED Results / Procedures / Treatments   Labs (all labs ordered are listed, but only abnormal results are displayed) Labs Reviewed  BASIC METABOLIC PANEL WITH GFR - Abnormal; Notable for the following components:      Result Value   Potassium 3.2 (*)    Glucose, Bld 147 (*)    All other components within normal limits  CBC WITH DIFFERENTIAL/PLATELET - Abnormal; Notable for the following components:   Platelets 122 (*)    All other components within normal limits  ETHANOL  CBG MONITORING, ED  TROPONIN I (HIGH SENSITIVITY)  TROPONIN I (HIGH SENSITIVITY)    EKG EKG Interpretation Date/Time:  Friday December 31 2023 13:37:04 EDT Ventricular Rate:  70 PR Interval:  164 QRS Duration:  94 QT Interval:  392 QTC Calculation: 423 R Axis:   47  Text Interpretation: Normal sinus rhythm Possible Inferior infarct , age undetermined Abnormal ECG When compared with ECG of 18-Nov-2023 08:35, PREVIOUS ECG IS PRESENT T wave inversion in lead 3, tw flattening in lateral precordia new since prior Confirmed by Early Glisson ([id number]) on 12/31/2023 5:14:24 PM  Radiology No results found.  Procedures Procedures    Medications Ordered in ED Medications - No data to display  ED Course/ Medical Decision Making/ A&P                                 Medical Decision Making Amount and/or Complexity of Data Reviewed Labs: ordered. Radiology: ordered.  Risk Decision regarding  hospitalization.   Patient is alert, afebrile, and hemodynamically stable in no acute distress.  Physical exam as noted above, notably no neurologic deficits or obvious cardiac findings on auscultation.  Differential for syncope is broad based on history and physical exam, I am reassured against SAH, dissection, ruptured AAA, PE, seizure.  I am most concerned for dysrhythmia, and patient does have risk factors that could lead to an ischemic dysrhythmia.  Also considering electrolyte derangement, AKI.  Patient had just eaten, low concern for hypoglycemia.  Patient is active outdoors  and frequently lifts heavy weights, have lower concern that this was heat related episode.  Labs resulted with no leukocytosis or anemia but mild thrombocytopenia noted of 122.  BMP with mild hypokalemia 3.2.  I personally interpreted patient's EKG, which demonstrated deep Q waves in lead III and T wave inversion, as well as flattened T waves in precordial leads.  These are new changes compared to prior EKG.  Will obtain troponin, but I am concerned that patient is in a high risk category for syncope.  On chart review, patient does see cardiology and had a stress test back in March in the setting of chest pain.  Troponin returned WNL.  EtOH undetectable.  I personally interpreted patient's chest x-ray, which demonstrated left lower lobe opacity, effusion versus pneumonia.  No elevated troponin or fevers to suggest the latter.  Overall, given patient's high risk group for syncope, he requires admission for further monitoring and management.  I spoke with admitting team, and family is also agreeable to this plan.  Patient remained stable in the ED.  Patient seen in conjunction with Dr. Annabell Key, who agreed with the above work-up and plan of care.         Final Clinical Impression(s) / ED Diagnoses Final diagnoses:  Syncope, unspecified syncope type    Rx / DC Orders ED Discharge Orders     None         Lorain Robson, MD 12/31/23 [place] [facility]    Early Glisson, MD 01/01/24 [phone]

## 2023-12-31 NOTE — H&P (Addendum)
 History and Physical    Richard Moore OZD:[id number] DOB: [birth year] DOA: 12/31/2023  PCP: Delfina Feller, FNP   Patient coming from: Home   Chief Complaint: Loss of consciousness   HPI: Richard Moore is a 63 y.o. male with medical history significant for COPD, hypertension, hyperlipidemia, and OSA who presents after a brief loss of consciousness.  Patient was in his usual state of health today when he went out to a restaurant with his wife.  After finishing a burger and 2 bloody Mary's, he developed acute dizziness and diaphoresis followed by brief LOC.  His wife describes seeing the patient's eyes rolled back while he slumped over, had some shaking of his right upper extremity, and then regained awareness within a minute but remained lethargic.  Patient denies any associated chest pain or palpitations, notes that he continues to feel tired, but has no other acute complaints.  Patient and his wife note that he has had some similar episodes recently that were not as severe and did not involve loss of consciousness.  With these prior episodes, patient developed acute dizziness and diaphoresis, but the symptoms then resolved spontaneously and were much more mild than today's episode.  ED Course: Upon arrival to the ED, patient is found to be afebrile and saturating well on room air with normal heart rate and stable blood pressure.  EKG demonstrates sinus rhythm.  Labs are most notable for normal troponin x 2, potassium 3.2, and platelets 122,000.  Review of Systems:  All other systems reviewed and apart from HPI, are negative.  Past Medical History:  Diagnosis Date   COPD (chronic obstructive pulmonary disease) (HCC)    GERD (gastroesophageal reflux disease)    Hyperlipidemia    Hypertension    Pneumonia    Sleep apnea    doesn't wear CPAP- uses mouth piece now    Past Surgical History:  Procedure Laterality Date   COLONOSCOPY     INGUINAL HERNIA REPAIR      right   VASECTOMY      Social History:   reports that he quit smoking about 16 years ago. His smoking use included cigarettes. He started smoking about 31 years ago. He has a 30 pack-year smoking history. He has never used smokeless tobacco. He reports current alcohol use of about 6.0 standard drinks of alcohol per week. He reports that he does not use drugs.  No Known Allergies  Family History  Problem Relation Age of Onset   Lung cancer Father        23 years ago   Colon cancer Neg Hx    Esophageal cancer Neg Hx    Rectal cancer Neg Hx    Stomach cancer Neg Hx      Prior to Admission medications   Medication Sig Start Date End Date Taking? Authorizing Provider  albuterol  (VENTOLIN  HFA) 108 (90 Base) MCG/ACT inhaler Inhale 2 puffs into the lungs every 6 (six) hours as needed for wheezing or shortness of breath. 06/10/23   Gaylyn Keas Mary-Margaret, FNP  amLODipine  (NORVASC ) 5 MG tablet Take 1 tablet (5 mg total) by mouth daily. 12/06/23 03/05/24  Delfina Feller, FNP  donepezil  (ARICEPT ) 10 MG tablet Take 1 tablet (10 mg total) by mouth at bedtime. 12/06/23   Gaylyn Keas Mary-Margaret, FNP  escitalopram  (LEXAPRO ) 10 MG tablet Take 1 tablet (10 mg total) by mouth daily. 12/06/23   Gaylyn Keas Mary-Margaret, FNP  ezetimibe  (ZETIA ) 10 MG tablet Take 1 tablet (10 mg total) by mouth daily. 12/06/23  Gaylyn Keas, Mary-Margaret, FNP  losartan -hydrochlorothiazide  (HYZAAR) 100-25 MG tablet TAKE 1 TABLET DAILY 12/21/23   Gaylyn Keas, Mary-Margaret, FNP  nitroGLYCERIN  (NITROSTAT ) 0.4 MG SL tablet Place 1 tablet (0.4 mg total) under the tongue every 5 (five) minutes as needed for chest pain. 02/18/22 12/31/23  Nishan, Peter C, MD  pantoprazole  (PROTONIX ) 40 MG tablet Take 1 tablet (40 mg total) by mouth daily. 12/06/23   Gaylyn Keas Mary-Margaret, FNP  rosuvastatin  (CRESTOR ) 10 MG tablet Take 1 tablet (10 mg total) by mouth daily. 04/01/23   Gaylyn Keas Mary-Margaret, FNP  Tiotropium Bromide-Olodaterol (STIOLTO RESPIMAT ) 2.5-2.5  MCG/ACT AERS Inhale 2 puffs into the lungs daily. 12/06/23 12/06/24  Delfina Feller, FNP  triamcinolone  cream (KENALOG ) 0.1 % Apply 1 Application topically 2 (two) times daily. 06/10/23   Delfina Feller, FNP    Physical Exam: Vitals:   12/31/23 1331 12/31/23 1333 12/31/23 1336 12/31/23 1735  BP:  100/67  112/67  Pulse:  69  (!) 53  Resp:  17  16  Temp:  97.8 F (36.6 C)    TempSrc:  Oral    SpO2: 97% 96%  99%  Weight:   113.4 kg   Height:   6\' 3"  (1.905 m)     Constitutional: NAD, calm  Eyes: PERTLA, lids and conjunctivae normal ENMT: Mucous membranes are moist. Posterior pharynx clear of any exudate or lesions.   Neck: supple, no masses  Respiratory: no wheezing, no crackles. No accessory muscle use.  Cardiovascular: S1 & S2 heard, regular rate and rhythm. No extremity edema.  Abdomen: No distension, no tenderness, soft. Bowel sounds active.  Musculoskeletal: no clubbing / cyanosis. No joint deformity upper and lower extremities.   Skin: no significant rashes, lesions, ulcers. Warm, dry, well-perfused. Neurologic: CN 2-12 grossly intact. Sensation to light touch intact. Strength 5/5 in all 4 limbs. Alert and oriented.  Psychiatric: Pleasant. Cooperative.    Labs and Imaging on Admission: I have personally reviewed following labs and imaging studies  CBC: Recent Labs  Lab 12/31/23 1345  WBC 5.0  NEUTROABS 3.3  HGB 14.8  HCT 42.8  MCV 89.5  PLT 122*   Basic Metabolic Panel: Recent Labs  Lab 12/31/23 1345  NA 140  K 3.2*  CL 106  CO2 25  GLUCOSE 147*  BUN 12  CREATININE 0.91  CALCIUM  9.0   GFR: Estimated Creatinine Clearance: 115.9 mL/min (by C-G formula based on SCr of 0.91 mg/dL). Liver Function Tests: No results for input(s): "AST", "ALT", "ALKPHOS", "BILITOT", "PROT", "ALBUMIN" in the last 168 hours. No results for input(s): "LIPASE", "AMYLASE" in the last 168 hours. No results for input(s): "AMMONIA" in the last 168 hours. Coagulation  Profile: No results for input(s): "INR", "PROTIME" in the last 168 hours. Cardiac Enzymes: No results for input(s): "CKTOTAL", "CKMB", "CKMBINDEX", "TROPONINI" in the last 168 hours. BNP (last 3 results) No results for input(s): "PROBNP" in the last 8760 hours. HbA1C: No results for input(s): "HGBA1C" in the last 72 hours. CBG: No results for input(s): "GLUCAP" in the last 168 hours. Lipid Profile: No results for input(s): "CHOL", "HDL", "LDLCALC", "TRIG", "CHOLHDL", "LDLDIRECT" in the last 72 hours. Thyroid  Function Tests: No results for input(s): "TSH", "T4TOTAL", "FREET4", "T3FREE", "THYROIDAB" in the last 72 hours. Anemia Panel: No results for input(s): "VITAMINB12", "FOLATE", "FERRITIN", "TIBC", "IRON", "RETICCTPCT" in the last 72 hours. Urine analysis:    Component Value Date/Time   COLORURINE YELLOW 01/13/2012 2053   APPEARANCEUR Clear 11/05/2017 1719   LABSPEC 1.025 01/13/2012 2053   PHURINE 5.5 01/13/2012  2053   GLUCOSEU Negative 11/05/2017 1719   HGBUR NEGATIVE 01/13/2012 2053   BILIRUBINUR Negative 11/05/2017 1719   KETONESUR NEGATIVE 01/13/2012 2053   PROTEINUR Negative 11/05/2017 1719   PROTEINUR NEGATIVE 01/13/2012 2053   UROBILINOGEN 0.2 01/13/2012 2053   NITRITE Negative 11/05/2017 1719   NITRITE NEGATIVE 01/13/2012 2053   LEUKOCYTESUR Negative 11/05/2017 1719   Sepsis Labs: @LABRCNTIP (procalcitonin:4,lacticidven:4) )No results found for this or any previous visit (from the past 240 hours).   Radiological Exams on Admission: DG Chest Portable 1 View Result Date: 12/31/2023 CLINICAL DATA:  Syncope. EXAM: PORTABLE CHEST 1 VIEW COMPARISON:  Feb 07, 2021 FINDINGS: The heart size and mediastinal contours are within normal limits. Mild, diffuse, chronic appearing increased interstitial lung markings are noted. Mild linear scarring and/or atelectasis is seen within the left lung base. No pleural effusion or pneumothorax is identified. The visualized skeletal  structures are unremarkable. IMPRESSION: Mild left basilar linear scarring and/or atelectasis. Electronically Signed   By: Virgle Grime M.D.   On: 12/31/2023 20:22    EKG: Independently reviewed. Sinus rhythm.   Assessment/Plan  1. Syncope  - Check orthostatic vitals, continue cardiac monitoring, check echocardiogram, check EEG    2. Hypertension  - Norvasc , losartan -hydrochlorothiazide     3. Anxiety; memory loss  - Aricept , Lexapro     4. Type II DM  - A1c was 6.5% in July 2024, diet-controlled at home  - Check CBGs and use low-intensity SSI if needed   5. COPD  - Not in exacerbation on admission  - Continue LAMA-LABA and as-needed albuterol    6. Hypokalemia  - Replacing   DVT prophylaxis: Lovenox   Code Status: Full  Level of Care: Level of care: Telemetry Cardiac Family Communication: Wife at bedside  Disposition Plan:  Patient is from: Home  Anticipated d/c is to: Home  Anticipated d/c date is: 4/19 or 01/02/24  Patient currently: Pending orthostatic vitals, cardiac monitoring, echocardiogram, EEG  Consults called: None  Admission status: Observation     Walton Guppy, MD [facility]  12/31/2023, 8:27 PM

## 2023-12-31 NOTE — ED Provider Notes (Signed)
 I saw and evaluated the patient, reviewed the resident's note and I agree with the findings and plan.  Pertinent History: This patient is a very pleasant 63 year old male who recently had a exercise tolerance test which was unremarkable about a month ago, he has been seen by cardiology Dr. Admission, he is followed for his hypertension as well.  He presents after having a syncopal episode while eating a meal at a restaurant, he had 2 drinks in a hamburger and was feeling very good and all of a sudden felt severely dizzy like the room was spinning, he became super sweaty, he felt like something bad was happening and then slumped over in his chair unresponsive, this was less than 1 minute and he came back to, paramedics found the patient to be diaphoretic but with a normal heart rate, there is no arrhythmia seen on the prehospital tracing however when I compare this tracing to prior hospital tracings and his EKG here there is a new T wave inversion in one of the inferior leads as well as T wave flattening in the lateral precordial leads compared to 1 month prior.  Pertinent Exam findings: The patient is awake alert and unremarkable in appearance, no edema, neurologically completely intact and normal, awake and alert and follows commands without difficulty, speech is normal, no facial droop, cardiac exam is unremarkable, no murmurs, normal pulses, nontender abdomen  The story is certainly more high risk, I do not think he had a seizure, it could have been an arrhythmia, he has new EKG findings and although they are not overtly ischemic they are different than prior and thus will likely need to be admitted on a heart monitor.  I was personally present and directly supervised the following procedures:  Medical evaluation and resuscitation  I personally interpreted the EKG as well as the resident and agree with the interpretation on the resident's chart.  Final diagnoses:  None      Cleotilde Rogue,  MD 01/01/24 [phone]

## 2023-12-31 NOTE — Progress Notes (Signed)
 EEG tech has received order for EEG. The current location is not conducive for EEG study to be performed.  Tech will check back periodically to determine if the patient has been moved to a suitable location.

## 2024-01-01 ENCOUNTER — Observation Stay (HOSPITAL_COMMUNITY)

## 2024-01-01 ENCOUNTER — Ambulatory Visit (HOSPITAL_COMMUNITY)

## 2024-01-01 ENCOUNTER — Observation Stay (HOSPITAL_BASED_OUTPATIENT_CLINIC_OR_DEPARTMENT_OTHER)

## 2024-01-01 DIAGNOSIS — R55 Syncope and collapse: Secondary | ICD-10-CM

## 2024-01-01 DIAGNOSIS — R413 Other amnesia: Secondary | ICD-10-CM | POA: Diagnosis not present

## 2024-01-01 DIAGNOSIS — R569 Unspecified convulsions: Secondary | ICD-10-CM | POA: Diagnosis not present

## 2024-01-01 DIAGNOSIS — E785 Hyperlipidemia, unspecified: Secondary | ICD-10-CM | POA: Diagnosis not present

## 2024-01-01 DIAGNOSIS — I10 Essential (primary) hypertension: Secondary | ICD-10-CM | POA: Diagnosis not present

## 2024-01-01 LAB — CBC
HCT: 41.6 % (ref 39.0–52.0)
Hemoglobin: 14.4 g/dL (ref 13.0–17.0)
MCH: 30.5 pg (ref 26.0–34.0)
MCHC: 34.6 g/dL (ref 30.0–36.0)
MCV: 88.1 fL (ref 80.0–100.0)
Platelets: 126 10*3/uL — ABNORMAL LOW (ref 150–400)
RBC: 4.72 MIL/uL (ref 4.22–5.81)
RDW: 12.6 % (ref 11.5–15.5)
WBC: 5.2 10*3/uL (ref 4.0–10.5)
nRBC: 0 % (ref 0.0–0.2)

## 2024-01-01 LAB — BASIC METABOLIC PANEL WITH GFR
Anion gap: 10 (ref 5–15)
BUN: 12 mg/dL (ref 8–23)
CO2: 25 mmol/L (ref 22–32)
Calcium: 8.7 mg/dL — ABNORMAL LOW (ref 8.9–10.3)
Chloride: 104 mmol/L (ref 98–111)
Creatinine, Ser: 0.75 mg/dL (ref 0.61–1.24)
GFR, Estimated: >60 mL/min (ref >60)
Glucose, Bld: 141 mg/dL — ABNORMAL HIGH (ref 70–99)
Potassium: 3.5 mmol/L (ref 3.5–5.1)
Sodium: 139 mmol/L (ref 135–145)

## 2024-01-01 LAB — GLUCOSE, CAPILLARY
Glucose-Capillary: 158 mg/dL — ABNORMAL HIGH (ref 70–99)
Glucose-Capillary: 105 mg/dL — ABNORMAL HIGH (ref 70–99)

## 2024-01-01 LAB — ECHOCARDIOGRAM COMPLETE
Area-P 1/2: 3.72 cm2
Height: 75 in
S' Lateral: 2.8 cm
Weight: 4003.55 [oz_av]

## 2024-01-01 LAB — MAGNESIUM: Magnesium: 2 mg/dL (ref 1.7–2.4)

## 2024-01-01 LAB — TSH: TSH: 0.932 u[IU]/mL (ref 0.350–4.500)

## 2024-01-01 LAB — HIV ANTIBODY (ROUTINE TESTING W REFLEX): HIV Screen 4th Generation wRfx: NONREACTIVE

## 2024-01-01 NOTE — Progress Notes (Signed)
 MB called spoke to Nurse Arlen Lacks, Pt is still not available and is currently in ECHO LAB. Will follow up as schedule permits

## 2024-01-01 NOTE — Plan of Care (Signed)

## 2024-01-01 NOTE — Procedures (Signed)
 Patient Name: Beniah Magnan  MRN: [id number]  Epilepsy Attending: Arleene Lack  Referring Physician/Provider: Walton Guppy, MD  Date: 01/01/2024 Duration: 23.09 mins  Patient history: 63yo M with syncope. EEG to evaluate for seizure  Level of alertness: Awake, asleep  AEDs during EEG study: None  Technical aspects: This EEG study was done with scalp electrodes positioned according to the 10-20 International system of electrode placement. Electrical activity was reviewed with band pass filter of 1-70Hz , sensitivity of 7 uV/mm, display speed of 35mm/sec with a 60Hz  notched filter applied as appropriate. EEG data were recorded continuously and digitally stored.  Video monitoring was available and reviewed as appropriate.  Description: The posterior dominant rhythm consists of 8-9 Hz activity of moderate voltage (25-35 uV) seen predominantly in posterior head regions, symmetric and reactive to eye opening and eye closing. Sleep was characterized by vertex waves, sleep spindles (12 to 14 Hz), maximal frontocentral region. Physiologic photic driving was not seen during photic stimulation.  Hyperventilation was not performed.     IMPRESSION: This study is within normal limits. No seizures or epileptiform discharges were seen throughout the recording.  A normal interictal EEG does not exclude the diagnosis of epilepsy.  Nonnie Pickney O Kylia Grajales

## 2024-01-01 NOTE — Progress Notes (Signed)
  Echocardiogram 2D Echocardiogram has been performed.  Farley Honer 01/01/2024, 9:07 AM

## 2024-01-01 NOTE — Progress Notes (Signed)
 PROGRESS NOTE    Richard Moore  BMW:[id number] DOB: Jun 22, 1961 DOA: 12/31/2023 PCP: Delfina Feller, FNP     Brief Narrative:  Richard Moore is a 63 y.o. male with medical history significant for COPD, hypertension, hyperlipidemia, and OSA who presents after a brief loss of consciousness.   Patient was in his usual state of health today when he went out to a restaurant with his wife.  After finishing a burger and 2 bloody Mary's, he developed acute dizziness and diaphoresis followed by brief LOC.  His wife describes seeing the patient's eyes rolled back while he slumped over, had some shaking of his right upper extremity, and then regained awareness within a minute but remained lethargic.   New events last 24 hours / Subjective: Patient currently undergoing EEG during my evaluation.  He admits to fatigue, has been in the emergency department all the evening and night and did not get much sleep.  Has also been having some generalized headaches recently.  Was in good health otherwise prior to the event of yesterday at the restaurant.  Denies any chest pains or palpitations.  Assessment & Plan:   Principal Problem:   Syncope Active Problems:   Hyperlipidemia with target LDL less than 100   Essential hypertension   Memory disturbance   Emphysema/COPD (HCC)   Anxiety   Diet-controlled diabetes mellitus (HCC)   Hypokalemia  Syncope - Troponin negative x 2 - Orthostatic vital sign negative - Echocardiogram with EF 60 to 65%, no aortic stenosis.  No significant change from prior. - EEG pending result - Check CT head as patient has also been having some headaches recently that is new - Continue telemetry  Hypertension - Norvasc , losartan , HCTZ - Orthostatic vital sign negative  Anxiety, memory loss - Aricept , Lexapro   Hyperlipidemia - Crestor   Diabetes mellitus - Sliding scale insulin  COPD - Stable  DVT prophylaxis: Lovenox   Code Status: Full  code Family Communication: None at bedside Disposition Plan: Home Status is: Observation The patient will require care spanning > 2 midnights and should be moved to inpatient because: Pending further workup    Antimicrobials:  Anti-infectives (From admission, onward)    None        Objective: Vitals:   12/31/23 2349 01/01/24 0434 01/01/24 0723 01/01/24 0945  BP: 125/76 121/85 127/85 130/86  Pulse: 61 (!) 59 64 (!) 57  Resp: 14 18 20    Temp: 98.5 F (36.9 C) 98.4 F (36.9 C) 98.3 F (36.8 C)   TempSrc: Oral Oral Oral   SpO2: 97% 99% 96%   Weight:  113.5 kg    Height:        Intake/Output Summary (Last 24 hours) at 01/01/2024 1221 Last data filed at 12/31/2023 2025 Gross per 24 hour  Intake 0 ml  Output --  Net 0 ml   Filed Weights   12/31/23 1336 01/01/24 0434  Weight: 113.4 kg 113.5 kg    Examination:  General exam: Appears calm and comfortable  Respiratory system: Clear to auscultation. Respiratory effort normal. No respiratory distress. No conversational dyspnea.  Cardiovascular system: S1 & S2 heard, RRR. No murmurs. No pedal edema. Gastrointestinal system: Abdomen is nondistended, soft and nontender. Normal bowel sounds heard. Central nervous system: Alert and oriented. No focal neurological deficits. Speech clear.  Extremities: Symmetric in appearance  Skin: No rashes, lesions or ulcers on exposed skin  Psychiatry: Judgement and insight appear normal. Mood & affect appropriate.   Data Reviewed: I have personally reviewed following  labs and imaging studies  CBC: Recent Labs  Lab 12/31/23 1345 01/01/24 0335  WBC 5.0 5.2  NEUTROABS 3.3  --   HGB 14.8 14.4  HCT 42.8 41.6  MCV 89.5 88.1  PLT 122* 126*   Basic Metabolic Panel: Recent Labs  Lab 12/31/23 1345 01/01/24 0335  NA 140 139  K 3.2* 3.5  CL 106 104  CO2 25 25  GLUCOSE 147* 141*  BUN 12 12  CREATININE 0.91 0.75  CALCIUM  9.0 8.7*  MG  --  2.0   GFR: Estimated Creatinine  Clearance: 131.8 mL/min (by C-G formula based on SCr of 0.75 mg/dL). Liver Function Tests: No results for input(s): "AST", "ALT", "ALKPHOS", "BILITOT", "PROT", "ALBUMIN" in the last 168 hours. No results for input(s): "LIPASE", "AMYLASE" in the last 168 hours. No results for input(s): "AMMONIA" in the last 168 hours. Coagulation Profile: No results for input(s): "INR", "PROTIME" in the last 168 hours. Cardiac Enzymes: No results for input(s): "CKTOTAL", "CKMB", "CKMBINDEX", "TROPONINI" in the last 168 hours. BNP (last 3 results) No results for input(s): "PROBNP" in the last 8760 hours. HbA1C: No results for input(s): "HGBA1C" in the last 72 hours. CBG: Recent Labs  Lab 01/01/24 0536  GLUCAP 158*   Lipid Profile: No results for input(s): "CHOL", "HDL", "LDLCALC", "TRIG", "CHOLHDL", "LDLDIRECT" in the last 72 hours. Thyroid  Function Tests: No results for input(s): "TSH", "T4TOTAL", "FREET4", "T3FREE", "THYROIDAB" in the last 72 hours. Anemia Panel: No results for input(s): "VITAMINB12", "FOLATE", "FERRITIN", "TIBC", "IRON", "RETICCTPCT" in the last 72 hours. Sepsis Labs: No results for input(s): "PROCALCITON", "LATICACIDVEN" in the last 168 hours.  No results found for this or any previous visit (from the past 240 hours).    Radiology Studies: ECHOCARDIOGRAM COMPLETE Result Date: 01/01/2024    ECHOCARDIOGRAM REPORT   Patient Name:   Richard Moore Date of Exam: 01/01/2024 Medical Rec #:  [id number]            Height:       75.0 in Accession #:    [id number]           Weight:       250.2 lb Date of Birth:  [birth year]            BSA:          2.414 m Patient Age:    62 years             BP:           127/85 mmHg Patient Gender: M                    HR:           55 bpm. Exam Location:  Inpatient Procedure: 2D Echo, Color Doppler and Cardiac Doppler (Both Spectral and Color            Flow Doppler were utilized during procedure). Indications:    Syncope R55  History:        Patient  has prior history of Echocardiogram examinations, most                 recent 12/06/2020. COPD; Risk Factors:Dyslipidemia and                 Hypertension.  Sonographer:    Kip Peon Referring Phys: [id number] TIMOTHY S OPYD IMPRESSIONS  1. Left ventricular ejection fraction, by estimation, is 60 to 65%. The left ventricle has normal function. The left ventricle has no regional wall  motion abnormalities. Left ventricular diastolic parameters were normal.  2. Right ventricular systolic function is normal. The right ventricular size is normal. Tricuspid regurgitation signal is inadequate for assessing PA pressure.  3. The mitral valve is grossly normal. Trivial mitral valve regurgitation. No evidence of mitral stenosis.  4. The aortic valve is tricuspid. Aortic valve regurgitation is not visualized. No aortic stenosis is present.  5. There is mild dilatation of the ascending aorta, measuring 40 mm.  6. The inferior vena cava is dilated in size with >50% respiratory variability, suggesting right atrial pressure of 8 mmHg. Comparison(s): No significant change from prior study. FINDINGS  Left Ventricle: Left ventricular ejection fraction, by estimation, is 60 to 65%. The left ventricle has normal function. The left ventricle has no regional wall motion abnormalities. The left ventricular internal cavity size was normal in size. There is  no left ventricular hypertrophy. Left ventricular diastolic parameters were normal. Right Ventricle: The right ventricular size is normal. No increase in right ventricular wall thickness. Right ventricular systolic function is normal. Tricuspid regurgitation signal is inadequate for assessing PA pressure. Left Atrium: Left atrial size was normal in size. Right Atrium: Right atrial size was normal in size. Pericardium: Trivial pericardial effusion is present. The pericardial effusion is posterior to the left ventricle. Mitral Valve: The mitral valve is grossly normal. Trivial mitral  valve regurgitation. No evidence of mitral valve stenosis. Tricuspid Valve: The tricuspid valve is grossly normal. Tricuspid valve regurgitation is trivial. No evidence of tricuspid stenosis. Aortic Valve: The aortic valve is tricuspid. Aortic valve regurgitation is not visualized. No aortic stenosis is present. Pulmonic Valve: The pulmonic valve was grossly normal. Pulmonic valve regurgitation is trivial. No evidence of pulmonic stenosis. Aorta: The aortic root is normal in size and structure. There is mild dilatation of the ascending aorta, measuring 40 mm. Venous: The inferior vena cava is dilated in size with greater than 50% respiratory variability, suggesting right atrial pressure of 8 mmHg. IAS/Shunts: The atrial septum is grossly normal.  LEFT VENTRICLE PLAX 2D LVIDd:         4.70 cm   Diastology LVIDs:         2.80 cm   LV e' medial:    8.70 cm/s LV PW:         1.30 cm   LV E/e' medial:  9.5 LV IVS:        1.20 cm   LV e' lateral:   13.90 cm/s LVOT diam:     2.10 cm   LV E/e' lateral: 5.9 LV SV:         79 LV SV Index:   33 LVOT Area:     3.46 cm  RIGHT VENTRICLE             IVC RV Basal diam:  4.10 cm     IVC diam: 2.10 cm RV Mid diam:    3.40 cm RV S prime:     17.00 cm/s TAPSE (M-mode): 2.6 cm LEFT ATRIUM             Index        RIGHT ATRIUM           Index LA diam:        4.20 cm 1.74 cm/m   RA Area:     17.60 cm LA Vol (A2C):   46.4 ml 19.22 ml/m  RA Volume:   49.10 ml  20.34 ml/m LA Vol (A4C):   33.4 ml 13.84 ml/m  LA Biplane Vol: 40.0 ml 16.57 ml/m  AORTIC VALVE LVOT Vmax:   97.70 cm/s LVOT Vmean:  63.200 cm/s LVOT VTI:    0.228 m  AORTA Ao Root diam: 3.40 cm Ao Asc diam:  4.00 cm MITRAL VALVE MV Area (PHT): 3.72 cm    SHUNTS MV Decel Time: 204 msec    Systemic VTI:  0.23 m MV E velocity: 82.50 cm/s  Systemic Diam: 2.10 cm MV A velocity: 80.50 cm/s MV E/A ratio:  1.02 Jackquelyn Mass MD Electronically signed by Jackquelyn Mass MD Signature Date/Time: 01/01/2024/11:41:53 AM    Final    DG Chest  Portable 1 View Result Date: 12/31/2023 CLINICAL DATA:  Syncope. EXAM: PORTABLE CHEST 1 VIEW COMPARISON:  Feb 07, 2021 FINDINGS: The heart size and mediastinal contours are within normal limits. Mild, diffuse, chronic appearing increased interstitial lung markings are noted. Mild linear scarring and/or atelectasis is seen within the left lung base. No pleural effusion or pneumothorax is identified. The visualized skeletal structures are unremarkable. IMPRESSION: Mild left basilar linear scarring and/or atelectasis. Electronically Signed   By: Virgle Grime M.D.   On: 12/31/2023 20:22      Scheduled Meds:  amLODipine   5 mg Oral Daily   arformoterol   15 mcg Nebulization BID   And   umeclidinium bromide   1 puff Inhalation Daily   donepezil   10 mg Oral QHS   enoxaparin  (LOVENOX ) injection  50 mg Subcutaneous Q24H   escitalopram   10 mg Oral Daily   hydrochlorothiazide   25 mg Oral Daily   losartan   100 mg Oral Daily   pantoprazole   40 mg Oral Daily   rosuvastatin   10 mg Oral Daily   sodium chloride  flush  3 mL Intravenous Q12H   sodium chloride  flush  3 mL Intravenous Q12H   triamcinolone  cream  1 Application Topical Daily   Continuous Infusions:  sodium chloride        LOS: 0 days   Time spent: 35 minutes   Daren Eck, DO [facility] 01/01/2024, 12:21 PM   Available via Epic secure chat 7am-7pm After these hours, please refer to coverage provider listed on [email or web address]

## 2024-01-01 NOTE — Progress Notes (Signed)
 EEG complete - results pending

## 2024-01-01 NOTE — Plan of Care (Signed)
  Problem: Education: Goal: Knowledge of condition and prescribed therapy will improve Outcome: Progressing   Problem: Cardiac: Goal: Will achieve and/or maintain adequate cardiac output Outcome: Progressing   Problem: Physical Regulation: Goal: Complications related to the disease process, condition or treatment will be avoided or minimized Outcome: Progressing   

## 2024-01-02 ENCOUNTER — Other Ambulatory Visit: Payer: Self-pay | Admitting: Physician Assistant

## 2024-01-02 DIAGNOSIS — R55 Syncope and collapse: Secondary | ICD-10-CM | POA: Diagnosis not present

## 2024-01-02 LAB — GLUCOSE, CAPILLARY
Glucose-Capillary: 100 mg/dL — ABNORMAL HIGH (ref 70–99)
Glucose-Capillary: 95 mg/dL (ref 70–99)

## 2024-01-02 NOTE — Plan of Care (Signed)
  Problem: Education: Goal: Knowledge of condition and prescribed therapy will improve Outcome: Progressing   Problem: Cardiac: Goal: Will achieve and/or maintain adequate cardiac output Outcome: Progressing   Problem: Physical Regulation: Goal: Complications related to the disease process, condition or treatment will be avoided or minimized Outcome: Progressing   Problem: Education: Goal: Knowledge of General Education information will improve Description: Including pain rating scale, medication(s)/side effects and non-pharmacologic comfort measures Outcome: Progressing   Problem: Health Behavior/Discharge Planning: Goal: Ability to manage health-related needs will improve Outcome: Progressing   Problem: Clinical Measurements: Goal: Ability to maintain clinical measurements within normal limits will improve Outcome: Progressing Goal: Will remain free from infection Outcome: Progressing Goal: Diagnostic test results will improve Outcome: Progressing Goal: Respiratory complications will improve Outcome: Progressing Goal: Cardiovascular complication will be avoided Outcome: Progressing   Problem: Coping: Goal: Level of anxiety will decrease Outcome: Progressing   Problem: Pain Managment: Goal: General experience of comfort will improve and/or be controlled Outcome: Progressing   Problem: Safety: Goal: Ability to remain free from injury will improve Outcome: Progressing   Problem: Skin Integrity: Goal: Risk for impaired skin integrity will decrease Outcome: Progressing

## 2024-01-02 NOTE — Progress Notes (Signed)
 Heart monitor placed for syncope  Dr. Stann Earnest to read

## 2024-01-02 NOTE — Discharge Summary (Signed)
 Physician Discharge Summary   Patient: Richard Moore MRN: [id number] DOB: 07-08-1961  Admit date:     12/31/2023  Discharge date: 01/02/24  Discharge Physician: Bobbetta Burnet   PCP: Delfina Feller, FNP   Recommendations at discharge:   Follow with PCP in 1-2 weeks Follow-up with cardiology within 1 week -heart monitor has been placed to follow-up with Dr. Stann Earnest Avoid operating any heavy machinery till cleared by cardiology PCP to continue monitor blood sugars, for possible needs for diabetic medications-sugars were acceptable on this admission  Discharge Diagnoses: Principal Problem:   Syncope Active Problems:   Hyperlipidemia with target LDL less than 100   Essential hypertension   Memory disturbance   Emphysema/COPD (HCC)   Anxiety   Diet-controlled diabetes mellitus (HCC)   Hypokalemia  Resolved Problems:   * No resolved hospital problems. *   Richard Moore is a 63 y.o. male with medical history significant for COPD, hypertension, hyperlipidemia, and OSA who presents after a brief loss of consciousness.   Patient was in his usual state of health today when he went out to a restaurant with his wife.  After finishing a burger and 2 bloody Mary's, he developed acute dizziness and diaphoresis followed by brief LOC.  His wife describes seeing the patient's eyes rolled back while he slumped over, had some shaking of his right upper extremity, and then regained awareness within a minute but remained lethargic.       Hypokalemia   Syncope - Troponin negative x 2 - Orthostatic vital sign negative - Echocardiogram with EF 60 to 65%, no aortic stenosis.  No significant change from prior. - EEG -read by neurologist negative - Check CT head as patient has also been having some headaches recently that is new - Continue telemetry-2 episodes of bradycardia and pauses Electrolytes were replaced -Curbside cardiology, recommended follow as an outpatient, Zio  patch was applied   Hypertension - Norvasc , losartan , HCTZ - Orthostatic vital sign negative   Anxiety, memory loss - Aricept , Lexapro -hold for now   Hyperlipidemia -  Continue Crestor    ? Diabetes mellitus- carb modified diet,  Blood sugars been stable on this admission, CBG 105, 100, 95 this morning Follow-up with the PCP with further evaluation and a possible need for oral medication as an outpatient Last A1c 3 weeks ago 5.5   COPD - Stable       Consultants: Curbside consultation cardiology, with close follow-up as an outpatient Procedures performed: EEG.  Disposition: Home Diet recommendation:  Discharge Diet Orders (From admission, onward)     Start     Ordered   01/02/24 0000  Diet - low sodium heart healthy        01/02/24 1314           Cardiac and Carb modified diet DISCHARGE MEDICATION: Allergies as of 01/02/2024   No Known Allergies      Medication List     PAUSE taking these medications    donepezil  10 MG tablet Wait to take this until: January 11, 2024 Commonly known as: ARICEPT  Take 1 tablet (10 mg total) by mouth at bedtime.   escitalopram  10 MG tablet Wait to take this until: January 12, 2024 Commonly known as: LEXAPRO  Take 1 tablet (10 mg total) by mouth daily.       STOP taking these medications    naproxen sodium 220 MG tablet Commonly known as: ALEVE       TAKE these medications    albuterol  108 (  90 Base) MCG/ACT inhaler Commonly known as: VENTOLIN  HFA Inhale 2 puffs into the lungs every 6 (six) hours as needed for wheezing or shortness of breath.   amLODipine  5 MG tablet Commonly known as: NORVASC  Take 1 tablet (5 mg total) by mouth daily.   ezetimibe  10 MG tablet Commonly known as: ZETIA  Take 1 tablet (10 mg total) by mouth daily.   losartan -hydrochlorothiazide  100-25 MG tablet Commonly known as: HYZAAR TAKE 1 TABLET DAILY   nitroGLYCERIN  0.4 MG SL tablet Commonly known as: NITROSTAT  Place 1 tablet (0.4 mg  total) under the tongue every 5 (five) minutes as needed for chest pain.   pantoprazole  40 MG tablet Commonly known as: PROTONIX  Take 1 tablet (40 mg total) by mouth daily.   rosuvastatin  10 MG tablet Commonly known as: CRESTOR  Take 1 tablet (10 mg total) by mouth daily.   Stiolto Respimat  2.5-2.5 MCG/ACT Aers Generic drug: Tiotropium Bromide-Olodaterol Inhale 2 puffs into the lungs daily. What changed: when to take this   triamcinolone  cream 0.1 % Commonly known as: KENALOG  Apply 1 Application topically 2 (two) times daily. What changed: when to take this        Discharge Exam: Filed Weights   12/31/23 1336 01/01/24 0434 01/02/24 0410  Weight: 113.4 kg 113.5 kg 111.1 kg        General:  AAO x 3,  cooperative, no distress;   HEENT:  Normocephalic, PERRL, otherwise with in Normal limits   Neuro:  CNII-XII intact. , normal motor and sensation, reflexes intact   Lungs:   Clear to auscultation BL, Respirations unlabored,  No wheezes / crackles  Cardio:    S1/S2, RRR, No murmure, No Rubs or Gallops   Abdomen:  Soft, non-tender, bowel sounds active all four quadrants, no guarding or peritoneal signs.  Muscular  skeletal:  Limited exam -global generalized weaknesses - in bed, able to move all 4 extremities,   2+ pulses,  symmetric, No pitting edema  Skin:  Dry, warm to touch, negative for any Rashes,  Wounds: Please see nursing documentation          Condition at discharge: good  The results of significant diagnostics from this hospitalization (including imaging, microbiology, ancillary and laboratory) are listed below for reference.   Imaging Studies: CT HEAD WO CONTRAST ( ) Result Date: 01/01/2024 CLINICAL DATA:  Syncope/presyncope, cerebrovascular cause suspected EXAM: CT HEAD WITHOUT CONTRAST TECHNIQUE: Contiguous axial images were obtained from the base of the skull through the vertex without intravenous contrast. RADIATION DOSE REDUCTION: This exam was  performed according to the departmental dose-optimization program which includes automated exposure control, adjustment of the mA and/or kV according to patient size and/or use of iterative reconstruction technique. COMPARISON:  CT head Jan 13, 2012. FINDINGS: Brain: No evidence of acute infarction, hemorrhage, hydrocephalus, extra-axial collection or mass lesion/mass effect. Vascular: No hyperdense vessel. Skull: No acute fracture. Sinuses/Orbits: No acute finding. IMPRESSION: No evidence of acute intracranial abnormality. Electronically Signed   By: Stevenson Elbe M.D.   On: 01/01/2024 20:02   EEG adult Result Date: 01/01/2024 Arleene Lack, MD     01/01/2024  1:49 PM Patient Name: Masato Pettie MRN: [id number] Epilepsy Attending: Arleene Lack Referring Physician/Provider: Walton Guppy, MD Date: 01/01/2024 Duration: 23.09 mins Patient history: 63yo M with syncope. EEG to evaluate for seizure Level of alertness: Awake, asleep AEDs during EEG study: None Technical aspects: This EEG study was done with scalp electrodes positioned according to the 10-20 International system of electrode  placement. Electrical activity was reviewed with band pass filter of 1-70Hz , sensitivity of 7 uV/mm, display speed of 75mm/sec with a 60Hz  notched filter applied as appropriate. EEG data were recorded continuously and digitally stored.  Video monitoring was available and reviewed as appropriate. Description: The posterior dominant rhythm consists of 8-9 Hz activity of moderate voltage (25-35 uV) seen predominantly in posterior head regions, symmetric and reactive to eye opening and eye closing. Sleep was characterized by vertex waves, sleep spindles (12 to 14 Hz), maximal frontocentral region. Physiologic photic driving was not seen during photic stimulation.  Hyperventilation was not performed.   IMPRESSION: This study is within normal limits. No seizures or epileptiform discharges were seen throughout the  recording. A normal interictal EEG does not exclude the diagnosis of epilepsy. Arleene Lack   ECHOCARDIOGRAM COMPLETE Result Date: 01/01/2024    ECHOCARDIOGRAM REPORT   Patient Name:   Koury MANU RUBEY Date of Exam: 01/01/2024 Medical Rec #:  [id number]            Height:       75.0 in Accession #:    [id number]           Weight:       250.2 lb Date of Birth:  05/04/1961            BSA:          2.414 m Patient Age:    62 years             BP:           127/85 mmHg Patient Gender: M                    HR:           55 bpm. Exam Location:  Inpatient Procedure: 2D Echo, Color Doppler and Cardiac Doppler (Both Spectral and Color            Flow Doppler were utilized during procedure). Indications:    Syncope R55  History:        Patient has prior history of Echocardiogram examinations, most                 recent 12/06/2020. COPD; Risk Factors:Dyslipidemia and                 Hypertension.  Sonographer:    Kip Peon Referring Phys: [id number] TIMOTHY S OPYD IMPRESSIONS  1. Left ventricular ejection fraction, by estimation, is 60 to 65%. The left ventricle has normal function. The left ventricle has no regional wall motion abnormalities. Left ventricular diastolic parameters were normal.  2. Right ventricular systolic function is normal. The right ventricular size is normal. Tricuspid regurgitation signal is inadequate for assessing PA pressure.  3. The mitral valve is grossly normal. Trivial mitral valve regurgitation. No evidence of mitral stenosis.  4. The aortic valve is tricuspid. Aortic valve regurgitation is not visualized. No aortic stenosis is present.  5. There is mild dilatation of the ascending aorta, measuring 40 mm.  6. The inferior vena cava is dilated in size with >50% respiratory variability, suggesting right atrial pressure of 8 mmHg. Comparison(s): No significant change from prior study. FINDINGS  Left Ventricle: Left ventricular ejection fraction, by estimation, is 60 to 65%. The left  ventricle has normal function. The left ventricle has no regional wall motion abnormalities. The left ventricular internal cavity size was normal in size. There is  no left ventricular hypertrophy. Left ventricular diastolic parameters were  normal. Right Ventricle: The right ventricular size is normal. No increase in right ventricular wall thickness. Right ventricular systolic function is normal. Tricuspid regurgitation signal is inadequate for assessing PA pressure. Left Atrium: Left atrial size was normal in size. Right Atrium: Right atrial size was normal in size. Pericardium: Trivial pericardial effusion is present. The pericardial effusion is posterior to the left ventricle. Mitral Valve: The mitral valve is grossly normal. Trivial mitral valve regurgitation. No evidence of mitral valve stenosis. Tricuspid Valve: The tricuspid valve is grossly normal. Tricuspid valve regurgitation is trivial. No evidence of tricuspid stenosis. Aortic Valve: The aortic valve is tricuspid. Aortic valve regurgitation is not visualized. No aortic stenosis is present. Pulmonic Valve: The pulmonic valve was grossly normal. Pulmonic valve regurgitation is trivial. No evidence of pulmonic stenosis. Aorta: The aortic root is normal in size and structure. There is mild dilatation of the ascending aorta, measuring 40 mm. Venous: The inferior vena cava is dilated in size with greater than 50% respiratory variability, suggesting right atrial pressure of 8 mmHg. IAS/Shunts: The atrial septum is grossly normal.  LEFT VENTRICLE PLAX 2D LVIDd:         4.70 cm   Diastology LVIDs:         2.80 cm   LV e' medial:    8.70 cm/s LV PW:         1.30 cm   LV E/e' medial:  9.5 LV IVS:        1.20 cm   LV e' lateral:   13.90 cm/s LVOT diam:     2.10 cm   LV E/e' lateral: 5.9 LV SV:         79 LV SV Index:   33 LVOT Area:     3.46 cm  RIGHT VENTRICLE             IVC RV Basal diam:  4.10 cm     IVC diam: 2.10 cm RV Mid diam:    3.40 cm RV S prime:      17.00 cm/s TAPSE (M-mode): 2.6 cm LEFT ATRIUM             Index        RIGHT ATRIUM           Index LA diam:        4.20 cm 1.74 cm/m   RA Area:     17.60 cm LA Vol (A2C):   46.4 ml 19.22 ml/m  RA Volume:   49.10 ml  20.34 ml/m LA Vol (A4C):   33.4 ml 13.84 ml/m LA Biplane Vol: 40.0 ml 16.57 ml/m  AORTIC VALVE LVOT Vmax:   97.70 cm/s LVOT Vmean:  63.200 cm/s LVOT VTI:    0.228 m  AORTA Ao Root diam: 3.40 cm Ao Asc diam:  4.00 cm MITRAL VALVE MV Area (PHT): 3.72 cm    SHUNTS MV Decel Time: 204 msec    Systemic VTI:  0.23 m MV E velocity: 82.50 cm/s  Systemic Diam: 2.10 cm MV A velocity: 80.50 cm/s MV E/A ratio:  1.02 Jackquelyn Mass MD Electronically signed by Jackquelyn Mass MD Signature Date/Time: 01/01/2024/11:41:53 AM    Final    DG Chest Portable 1 View Result Date: 12/31/2023 CLINICAL DATA:  Syncope. EXAM: PORTABLE CHEST 1 VIEW COMPARISON:  Feb 07, 2021 FINDINGS: The heart size and mediastinal contours are within normal limits. Mild, diffuse, chronic appearing increased interstitial lung markings are noted. Mild linear scarring and/or atelectasis is seen within the left  lung base. No pleural effusion or pneumothorax is identified. The visualized skeletal structures are unremarkable. IMPRESSION: Mild left basilar linear scarring and/or atelectasis. Electronically Signed   By: Virgle Grime M.D.   On: 12/31/2023 20:22    Microbiology: Results for orders placed or performed in visit on 11/07/19  SARS Coronavirus 2 (TAT 6-24 hrs)     Status: None   Collection Time: 11/07/19 12:00 AM  Result Value Ref Range Status   SARS Coronavirus 2 RESULT: NEGATIVE  Final    Comment: RESULT: NEGATIVESARS-CoV-2 INTERPRETATION:A NEGATIVE  test result means that SARS-CoV-2 RNA was not present in the specimen above the limit of detection of this test. This does not preclude a possible SARS-CoV-2 infection and should not be used as the  sole basis for patient management decisions. Negative results must be combined  with clinical observations, patient history, and epidemiological information. Optimum specimen types and timing for peak viral levels during infections caused by SARS-CoV-2  have not been determined. Collection of multiple specimens or types of specimens may be necessary to detect virus. Improper specimen collection and handling, sequence variability under primers/probes, or organism present below the limit of detection may  lead to false negative results. Positive and negative predictive values of testing are highly dependent on prevalence. False negative test results are more likely when prevalence of disease is high.The expected result is NEGATIVE.Fact S heet for  Healthcare Providers: [employer] Sheet for Patients: https://poole-fre[employer].org/ Reference Range - Negative     Labs: CBC: Recent Labs  Lab 12/31/23 1345 01/01/24 0335  WBC 5.0 5.2  NEUTROABS 3.3  --   HGB 14.8 14.4  HCT 42.8 41.6  MCV 89.5 88.1  PLT 122* 126*   Basic Metabolic Panel: Recent Labs  Lab 12/31/23 1345 01/01/24 0335  NA 140 139  K 3.2* 3.5  CL 106 104  CO2 25 25  GLUCOSE 147* 141*  BUN 12 12  CREATININE 0.91 0.75  CALCIUM  9.0 8.7*  MG  --  2.0   Liver Function Tests: No results for input(s): "AST", "ALT", "ALKPHOS", "BILITOT", "PROT", "ALBUMIN" in the last 168 hours. CBG: Recent Labs  Lab 01/01/24 0536 01/01/24 2058 01/02/24 0601 01/02/24 1135  GLUCAP 158* 105* 100* 95    Discharge time spent: greater than 30 minutes.  Signed: Bobbetta Burnet, MD [facility] 01/02/2024

## 2024-01-02 NOTE — Significant Event (Signed)
 Pt noted to have a pause and brady down to 45bpm while taking his incruse dpi. This was reproduced with his RN at bedside by asking pt to take a deep breath. RN to notify MD.

## 2024-01-03 ENCOUNTER — Other Ambulatory Visit

## 2024-01-03 DIAGNOSIS — R55 Syncope and collapse: Secondary | ICD-10-CM

## 2024-01-03 NOTE — Progress Notes (Unsigned)
 Enrolled for [employer] to mail a ZIO AT Live Telemetry monitor to patients address on file.   Ordered from hospital.  Dr. Stann Earnest to read.

## 2024-01-06 ENCOUNTER — Other Ambulatory Visit: Payer: Self-pay

## 2024-01-06 DIAGNOSIS — J439 Emphysema, unspecified: Secondary | ICD-10-CM

## 2024-01-09 NOTE — Patient Instructions (Addendum)
  Pulmonary emphysema, unspecified emphysema type (HCC) Alpha 1 MS phenotyple  - stable   Plan  - continue stiolto scheduled with albuterol  as needed   ILD (interstitial lung disease) (HCC)  - this is mild and post inflamatory from 2013 pneumonia. Stable between CT 2019 =-> 2024 CT chest -> 2025 PFT = physical  exam shows clubbing and mechanic hands - need to rule out Autimmune ILD - need to followuwp CT next few months  Plan - do HRCT next few months - check Mysoitis antibiody panel, ssa, ssb, scl-70 - check Quantiferon gold  Lung cancer screeening 30-45pack per day former smoker   Last CT chest April 2024 without cancer   Plan  - capture information in HRCT next few  months  Syncope   - per cardiology  Sleep Apnea  - per your sleep docor  Followup -3 months or sooner if needed; 30 min vsiit  - ILD symp score and walk test at fllowup

## 2024-01-09 NOTE — Progress Notes (Unsigned)
 Subjective:     Patient ID: Richard Moore, male   DOB: 1961-04-17, 63 y.o.   MRN: [id number]  PCP Richard Feller, FNP  HPI  IOV 12/16/2017  Chief Complaint  Patient presents with   Consult    COPD per [facility]. Pt last saw MR 06/22/12.  Pt does have c/o cough sometimes with brown to yellow mucus.    63 year old male who says that in 2013 I took care of him for pneumonia.  Review of the chart and visualization shows that he had CT scan evidence of left lower lobe pneumonia in 2013.  After that he had complete recovery but he says clinically he did not follow-up with me.  Recently went and saw her primary care physician according to his history for left upper back pain along with lower back mid spinal area pain that is chronic.  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  As part of this evaluation for pain he had a chest x-ray that then they reported to him he has COPD [I visualized the chest x-ray from February 2019 and confirmed the presence of hyperinflation and right lower lobe atelectasis] therefore he has been referred here.  He says he that he does not have any symptoms but when I started questioning him again he started admitting to chronic cough for many years.  It happens early in the morning when he brushes the teeth.  His wife notices it.  It does not bother him.  Therefore it is mild to moderate in intensity.  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  Several years ago this was not the case.  There are no other symptoms.  Med review history shows associated lisinopril  intake for many years.  Results for Richard Moore (MRN [id number]) as of 12/16/2017 09:30  Ref. Range 11/05/2017 17:19  Creatinine Latest Ref Range: 0.76 - 1.27 mg/dL 7.82   Results for Richard Moore (MRN [id number]) as of  12/16/2017 09:30  Ref. Range 11/05/2017 17:19  Hemoglobin Latest Ref Range: 13.0 - 17.7 g/dL 57.8    CT Chest data April 2019  IMPRESSION: 1. Scattered areas of peribronchovascular and subpleural ground-glass, with somewhat of a mid and lower lung zone predominance. Findings are nonspecific and may be post infectious/postinflammatory in etiology when compared with 02/29/2012. Nonspecific interstitial pneumonitis is not excluded. 2. 4 mm subpleural right lower lobe nodule, likely benign. No follow-up needed if patient is low-risk. Non-contrast chest CT can be considered in 12 months if patient is high-risk. This recommendation follows the consensus statement: Guidelines for Management of Incidental Pulmonary Nodules Detected on CT Images: From the Fleischner Society 2017; Radiology 2017; 284:228-243. 3.  Emphysema (ICD10-J43.9). 4. Aortic atherosclerosis (ICD10-170.0). Coronary artery calcification. 5. Tiny right renal stone.     Electronically Signed   By: Richard Denis M.D.   On: 01/01/2018 08:47     OV 11/07/2020  Subjective:  Patient ID: Richard Moore, male , DOB: Mar 03, 1961 , age 63 y.o. , MRN: [id number] , ADDRESS: [address] [place] [address] PCP Richard Feller, FNP Patient Care Team: Richard Feller, FNP as PCP - General (Nurse Practitioner)  This Provider for this visit: Treatment Team:  Attending Provider: Maire Scot, MD    11/07/2020 -   Chief Complaint  Patient presents with  Follow-up    Recently had PNA in Jan, still winded easily     HPI Richard Moore 63 y.o. - returns for follow-up.  It is just shy of 3 years since I last saw him.  Therefore technically is an established patient.  He says after he last saw me for shortness of breath and cough [he is on lisinopril ] he had CT scan of the chest that showed a 4 mm nodule but also emphysema and plus minus early ILD.  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  He was only coming to [place] on the weekends.  Therefore has been unable to keep up with follow-up.  He says he had his Covid vaccine [not documented in our records].  He has had 2 shots of this.  He says that over the last 3 years he has had insidious worsening of shortness of breath with exertion relieved by rest.  He also has cough.  There is no associated chest pain orthopnea proximal nocturnal dyspnea.  He could be snoring.  Symptoms suggest progressive.  He is desperate about the severity of symptoms.  Mid January 2022 he had bronchitis symptoms which he described.  He went to an urgent care and was told he had double pneumonia.  But he says he was never Covid tested.  He never had a chest x-ray but was given antibiotics for "double pneumonia".  He says this is set him back.   CXR Feb 2019  Narrative & Impression  CLINICAL DATA:  Chest pain and cough.   EXAM: CHEST  2 VIEW   COMPARISON:  Chest radiograph June 30, 2013   FINDINGS: Cardiomediastinal silhouette is normal. No pleural effusions or focal consolidations. Similar mild interstitial prominence. Strandy densities RIGHT lung base. Trachea projects midline and there is no pneumothorax. Soft tissue planes and included osseous structures are non-suspicious.   IMPRESSION: Mild suspected COPD with RIGHT lung base atelectasis.     Electronically Signed   By: Richard Moore M.D.   On: 11/06/2017 01:43      .   11/29/2020 Follow up ; COPD, chronic cough, abnormal CT chest and right lung nodule Patient returns for a 1 month follow-up.  Patient was seen last visit for a follow-up with complaints of shortness of breath and cough lingering for 1 month. Sick in January 2022, dx with Pneumonia . Treated with antibiotics and steroids . Got better but cough and dyspnea persisted. Patient was set up for multiple tests and lab work.  Last visit labs showed a negative D-dimer, BNP.  Mildly elevated LFT with ALT  at 54.  Normal CBC with eosinophils at absolute count 100.  Platelets were slightly low at 128,000. High-resolution CT chest and 2D echo are pending. Covid IGG was very high indicative of recent infection.  He was started on Spiriva  last visit but did not get this at the pharmacy.  We called the pharmacy but prescription was not sent .  Since last visit patient says he is feeling about the same.  Gets short of breath with activities.  Has a daily chronic cough.  Patient is fully vaccinated for COVID-19.  Of note patient is on a ACE inhibitor. He denies any fever or discolored mucus chest pain orthopnea PND or increased leg swelling. PFTs today show mild restriction with an FEV1 at 83%, ratio 83, FVC 76%.  No significant bronchodilator response, DLCO 85%.  Plan Begin Spiriva  2 puffs daily .  Activity as tolerated Once CT and Echo are resulted,results will be called.  Discussed with primary care provider that Zestoretic  (lisinopril ) could be making your cough worse, may need an alternative if able Follow-up with Dr. Bertrum Brodie in 6 weeks and as needed Please contact office for sooner follow up if symptoms do not improve or worsen or seek emergency care    OV 02/07/2021  Subjective:  Patient ID: Richard Moore, male , DOB: [birth year] , age 39 y.o. , MRN: [id number] , ADDRESS: [address] [place] [address] PCP Richard Feller, FNP Patient Care Team: Richard Feller, FNP as PCP - General (Nurse Practitioner)  This Provider for this visit: Treatment Team:  Attending Provider: Maire Scot, MD    02/07/2021 -   Chief Complaint  Patient presents with   Follow-up    2 mo f/u. States he developed PNA again back in April 2022.    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 -presents for follow-up.  Last seen by myself in February 2022.  He then  followed up with nurse practitioner in March 2022 and was started on Spiriva .  After that he had high-resolution CT chest.  I personally visualized this.  He has some basilar postinflammatory ILD changes.  This is mild.  It is unchanged compared to 2019.  I suspect this is postinflammatory ILD.  His pulmonary function test shows restriction with lower diffusion reflecting combination of emphysema and ILD.  On walking desaturation test today he did not desaturate.  He was started on Spiriva  and this is helping.  In April 2022 he had pneumonia according to him but he describes to me as COPD exacerbation symptoms.  Resolved after 2 rounds of antibiotics and 1 round of prednisone .  He says his x-ray showed pneumonia I do not have that x-ray with us .  The x-ray was done after the CT scan.  So the CT scan does not show any pneumonia.  His main complaint is that he is very short of breath particularly walking upstairs and at work.  Sometimes there is associated exertional chest pain.  He does have coronary artery calcification on the CT.  His echo was normal but he is never had a cardiac stress test seen a cardiologist.  His symptoms definitely seem out of proportion to his walking desaturation test.    HRCT 11/29/20  Narrative & Impression  CLINICAL DATA:  63 year old male with history of shortness of breath. Chronic cough.   EXAM: CT CHEST WITHOUT CONTRAST   TECHNIQUE: Multidetector CT imaging of the chest was performed following the standard protocol without intravenous contrast. High resolution imaging of the lungs, as well as inspiratory and expiratory imaging, was performed.   COMPARISON:  High-resolution chest CT 12/31/2017.   FINDINGS: Cardiovascular: Heart size is normal. There is no significant pericardial fluid, thickening or pericardial calcification. There is aortic atherosclerosis, as well as atherosclerosis of the great vessels of the mediastinum and the coronary arteries,  including calcified atherosclerotic plaque in the left anterior descending and left circumflex coronary arteries.   Mediastinum/Nodes: No pathologically enlarged mediastinal or hilar lymph nodes. Please note that accurate exclusion of hilar adenopathy is limited on noncontrast CT scans. Esophagus is unremarkable in appearance. No axillary lymphadenopathy.   Lungs/Pleura: High-resolution images again demonstrate some patchy areas of peripheral predominant ground-glass attenuation, septal thickening, mild cylindrical bronchiectasis and peripheral bronchiolectasis. These findings have no definitive craniocaudal gradient.  No definitive honeycombing confidently identified. Inspiratory and expiratory imaging demonstrates some mild air trapping indicative of mild small airways disease. Mild centrilobular and paraseptal emphysema again noted. No acute consolidative airspace disease. No pleural effusions. No suspicious appearing pulmonary nodules or masses are noted. Small calcified granuloma in the periphery of the right upper lobe.   Upper Abdomen: Diffuse low attenuation throughout the visualized hepatic parenchyma, indicative of hepatic steatosis.   Musculoskeletal: There are no aggressive appearing lytic or blastic lesions noted in the visualized portions of the skeleton.   IMPRESSION: 1. The appearance of the lungs is compatible with interstitial lung disease, with a spectrum of findings considered most compatible with an alternative diagnosis (not usual interstitial pneumonia) per current ATS guidelines. Overall, findings have been stable compared to prior study from 2019 and are favored to reflect mild nonspecific interstitial pneumonia (NSIP) in conjunction with a background of mild centrilobular and paraseptal emphysema. 2. Aortic atherosclerosis, in addition to 2 vessel coronary artery disease. Please note that although the presence of coronary artery calcium  documents the  presence of coronary artery disease, the severity of this disease and any potential stenosis cannot be assessed on this non-gated CT examination. Assessment for potential risk factor modification, dietary therapy or pharmacologic therapy may be warranted, if clinically indicated. 3. Hepatic steatosis.   Aortic Atherosclerosis (ICD10-I70.0) and Emphysema (ICD10-J43.9).     Electronically Signed   By: Alexandria Angel M.D.   On: 11/29/2020 12:05      ECHO 11/29/20   IMPRESSIONS     1. Left ventricular ejection fraction, by estimation, is 60 to 65%. The  left ventricle has normal function. The left ventricle has no regional  wall motion abnormalities. Left ventricular diastolic parameters were  normal. The average left ventricular  global longitudinal strain is 17.6 %. The global longitudinal strain is  normal.   2. Right ventricular systolic function is normal. The right ventricular  size is normal. There is normal pulmonary artery systolic pressure.   3. The mitral valve is normal in structure. Trivial mitral valve  regurgitation. No evidence of mitral stenosis.   4. The aortic valve is tricuspid. Aortic valve regurgitation is not  visualized. No aortic stenosis is present.   5. Aortic dilatation noted. There is borderline dilatation of the  ascending aorta, measuring 38 mm.   6. The inferior vena cava is normal in size with greater than 50%  respiratory variability, suggesting right atrial pressure of 3 mmHg.     OV 06/25/2021  Subjective:  Patient ID: Richard Moore, male , DOB: 1961/07/24 , age 89 y.o. , MRN: [id number] , ADDRESS: [address] [place] [address] PCP Richard Feller, FNP Patient Care Team: Richard Feller, FNP as PCP - General (Nurse Practitioner)  This Provider for this visit: Treatment Team:  Attending Provider: Maire Scot, MD    06/25/2021 -   Chief Complaint  Patient presents with   Follow-up    Pt states he  has been doing good since last visit and denies any complaints.    HPI Katai Alcala 63 y.o. -returns for follow-up.  He has dyspnea due to obesity and pulmonary issues of mild emphysema and postinflammatory ILD.  He had coronary artery calcification.  He saw Dr. Donata Fryer admission.  He has been deemed to have nonobstructive coronary artery disease.  I reviewed the notes.  At this point in time his dyspnea still persist.  His dyspnea score is 14.  See below.  It is some improved  after Spiriva  but he still frustrated by it.  He says he might be getting anxious.Aaron Aas  His anxiety score is 5.  He has never been to pulmonary rehabilitation.  He is working overtime.  He has difficulty attending pulmonary rehabilitation.  He thinks tuberculin by Christmas.  He is willing to attend pulmonary rehabilitation in January 2023.  We discussed about upgrading his Spiriva  which has been helping him to combination Stiolto or Anoro.  He is willing to try this.  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  I have told him about this.  There is no family history of COPD but I advised him to get his family members checked out.  He declined a flu shot.  He has obesity and we discussed weight loss options.  Does not have diabetes.  Advised him a low glycemic diet with mild calorie restriction.  He is willing to try this.   10/28/2022 Pt. Presents today for medication refills.He states he has been doing well. He is at his baseline. He has not had any flares. He does have occasional exertional dyspnea. He is compliant with his Stialto daily and his albuterol  as needed.  He was last seen by Dr. Bertrum Brodie 04/2021. Last HRCT was 11/2020, and this showed stable disease. He is due for follow up.  Pt had a Coronary Calcium  scoring CT Chest 10/2020, at that time there were no suspicious appearing pulmonary nodules or masses .There was notation of hepatic steatosis. I explained that Hepatic steatosis is a term that describes the build up of fat in the  liver. It is normal to have small amounts of fat in your liver, but when the proportion of liver cells that contain fat exceeds more than 5% it is indicative of early stage fatty liver.Treatment often involves reducing risk factors through a diet and exercise plan. It is generally a benign condition, but in a small percentage of patients it does require follow up. I have asked the  patient follow up with PCP regarding potential risk factor modification, dietary therapy or pharmacologic therapy if clinically indicated.   Sats are 98%, he needs his follow up with Dr. Bertrum Brodie for his ILD at next available. We have renewed his Stialto prescription, his albuterol  rescue inhaler, and provided him with some samples today in the office.   Test Results: Coronary Calcium  scoring CT Chest 10/2020 Scattered throughout the periphery of the visualize lungs there are some patchy areas of mild ground-glass attenuation and septal thickening. Within the visualized portions of the thorax there are no suspicious appearing pulmonary nodules or masses, there is no acute consolidative airspace disease, no pleural effusions, no pneumothorax and no lymphadenopathy. Visualized portions of the upper abdomen demonstrates diffuse low attenuation throughout the visualized hepatic parenchyma, indicative of hepatic steatosis. There are no aggressive appearing lytic or blastic lesions noted in the visualized portions of the skeleton. The appearance of the lung bases is once again concerning for interstitial lung disease, similar to prior high-resolution chest CT 11/29/2020. Hepatic steatosis was also noted.   OV 12/07/2022  Subjective:  Patient ID: Richard Moore, male , DOB: December 25, 1960 , age 53 y.o. , MRN: [id number] , ADDRESS: [address] [place] [address] PCP Richard Feller, FNP Patient Care Team: Richard Feller, FNP as PCP - General (Nurse Practitioner)  This Provider for this visit:  Treatment Team:  Attending Provider: Maire Scot, MD   12/07/2022 -   Chief Complaint  Patient presents with   Follow-up    No c/o f/u  on copd      HPI Richard Moore 63 y.o. -returns for follow-up.  I personally not seen him since 2022.  He then ran out of his Stiolto and saw a nurse practitioner Sallee Craw last month.  She refilled this on a 90-day supply with [facility].  He says he is doing well.  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  He has dyspnea on exertion but it is mild.  It is not impeding his work.  He continues to be very strong and fully active.  His last pulmonary function test was in 2022.  Last CT scan of the chest was in 2022.  I went over a smoking history.  He states he smoked at least 3 packs/day for 15 years and quit 15 years ago.  This makes him 45 pack smoking history.  We went over lung cancer screening guidelines.  He also has ILD.  He is willing to get a high-resolution CT scan of the chest done that will cover both progression of ILD issues and also lung cancer screening.  Subjectively he has not progressed.  However we do not have objective data.  We discussed respiratory vaccines.  He says he took COVID-vaccine upon the insistence of his wife but he prefers not to take vaccines.  We went over the flu shot and the RSV vaccine.  Indicated to him that these vaccines are more established safety profile.  He is going to reflect whether he will change his mindset about vaccines.          OV 01/10/2024  Subjective:  Patient ID: Richard Moore, male , DOB: 01-Aug-1961 , age 13 y.o. 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 Breathing is overall doing well. He is using his albuterol  inhaler 2 x daily on average.      HPI Richard Moore 63 y.o. -presents for follow-up.  Presents with his wife Jenette Mitchell.  He has had a hospitalization for syncope.  Etiology not known.  ZIO monitor in progress.  Wife states that when he takes a deep inhalation for his inhalers he does get bradycardic for a few seconds.  From a respiratory standpoint he is stable.  He is memory issues continue.  His Lexapro  and Aricept  are on hold.  The wife asked me about this.  I defer this to primary care/neurology.  In addition they had questions about his sleep apnea.  Reviewed external medical records indicate that his sleep apnea is being managed by neurology.  I asked him to follow-up with them.  He has cardiology workup in progress.  His exercise hypoxemia test is also stable.  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  He  is also a Curator.  However the presence of clubbing ILD and mechanic hands indicated to the wife need to rule out myositis.  In 2022 a limited serology profile was negative.  His last CT scan was a year ago.  He needs another CT scan to follow-up with ILD and also to ensure there is no lung cancer.  They aligned with getting all this workup done.      SYMPTOM SCALE - ILD 02/07/2021  06/25/2021   O2 use ra ra  Shortness of Breath 0 -> 5 scale with 5 being worst (score 6 If unable to do)   At rest 2 0  Simple tasks - showers, clothes change, eating, shaving 3 2  Household (dishes, doing bed,  laundry) 3 2  Shopping 3 2  Walking level at own pace 3 3  Walking up Stairs 5 5  Total (30-36) Dyspnea Score 19 14  How bad is your cough? 2 1  How bad is your fatigue 4 5  How bad is nausea 0 0  How bad is vomiting?  0 0  How bad is diarrhea? 4 0  How bad is anxiety? 3 5  How bad is depression 1 0      Simple office walk 185 feet x  3 laps goal with forehead probe 02/07/2021  01/10/2024   O2 used ra ra  Number laps completed 3   Comments about pace x   Resting Pulse Ox/HR 98% and 65/min   Final Pulse Ox/HR 99% and 86/min   Desaturated </= 88% no   Desaturated <= 3% points no   Got Tachycardic >/= 90/min no   Symptoms at end of test Dyspnea t end of 2nd lap   Miscellaneous comments x         SIT STAND TEST - goal 15 times   01/10/2024    O2 used ra   PRobe - finter or forehead finger   Number sit and stand completed - goal 15 15   Time taken to complete 1 min and 10 sec   Resting Pulse Ox/HR/Dyspnea  98% and 67/min and dyspnea of 1/10    Peak measures 97 % and 86/min and dyspnea of 2/10   Final Pulse Ox/HR 96% and 80/min and dyspnea of 1/10   Desaturated </= 88% no   Desaturated <= 3% points ni   Got Tachycardic >/= 90/min no   Miscellaneous comments x       CT Chest data from date:   - personally visualized and independently interpreted :  - my findings are:   IMPRESSION: 1. Stable findings in the lungs once again suggestive of interstitial lung disease and emphysema, as above, essentially unchanged compared to the prior study, most compatible with an alternative diagnosis (not usual interstitial pneumonia) per current ATS guidelines, once again favored to reflect mild nonspecific interstitial pneumonia (NSIP). 2. Aortic atherosclerosis, in addition to three-vessel coronary artery disease. Please note that although the presence of coronary artery calcium  documents the presence of coronary artery disease, the severity of this disease and any  potential stenosis cannot be assessed on this non-gated CT examination. Assessment for potential risk factor modification, dietary therapy or pharmacologic therapy may be warranted, if clinically indicated. 3. Hepatic steatosis.   Aortic Atherosclerosis (ICD10-I70.0) and Emphysema (ICD10-J43.9).     Electronically Signed   By: Alexandria Angel M.D.   On: 12/28/2022 12:48  PFT     Latest Ref Rng & Units 01/10/2024    8:26  AM 11/29/2020    9:50 AM  PFT Results  FVC-Pre L 4.23  P 4.38   FVC-Predicted Pre % 73  P 74   FVC-Post L  4.50   FVC-Predicted Post %  76   Pre FEV1/FVC % % 84  P 82   Post FEV1/FCV % %  83   FEV1-Pre L 3.55  P 3.60   FEV1-Predicted Pre % 81  P 80   FEV1-Post L  3.75   DLCO uncorrected ml/min/mmHg 26.99  P 28.44   DLCO UNC% % 82  P 85   DLCO corrected ml/min/mmHg 27.15  P 27.35   DLCO COR %Predicted % 82  P 81   DLVA Predicted % 111  P 105   TLC L  6.17   TLC % Predicted %  75   RV % Predicted %  70     P Preliminary result       LAB RESULTS last 96 hours No results found.       has a past medical history of COPD (chronic obstructive pulmonary disease) (HCC), GERD (gastroesophageal reflux disease), Hyperlipidemia, Hypertension, Pneumonia, and Sleep apnea.   reports that he quit smoking about 16 years ago. His smoking use included cigarettes. He started smoking about 31 years ago. He has a 30 pack-year smoking history. He has never used smokeless tobacco.  Past Surgical History:  Procedure Laterality Date   COLONOSCOPY     INGUINAL HERNIA REPAIR     right   VASECTOMY      No Known Allergies   There is no immunization history on file for this patient.  Family History  Problem Relation Age of Onset   Lung cancer Father        23 years ago   Colon cancer Neg Hx    Esophageal cancer Neg Hx    Rectal cancer Neg Hx    Stomach cancer Neg Hx      Current Outpatient Medications:    albuterol  (VENTOLIN  HFA) 108 (90 Base) MCG/ACT  inhaler, Inhale 2 puffs into the lungs every 6 (six) hours as needed for wheezing or shortness of breath., Disp: 18 g, Rfl: 6   amLODipine  (NORVASC ) 5 MG tablet, Take 1 tablet (5 mg total) by mouth daily., Disp: 90 tablet, Rfl: 1   ezetimibe  (ZETIA ) 10 MG tablet, Take 1 tablet (10 mg total) by mouth daily., Disp: 90 tablet, Rfl: 1   losartan -hydrochlorothiazide  (HYZAAR) 100-25 MG tablet, TAKE 1 TABLET DAILY, Disp: 90 tablet, Rfl: 1   nitroGLYCERIN  (NITROSTAT ) 0.4 MG SL tablet, Place 1 tablet (0.4 mg total) under the tongue every 5 (five) minutes as needed for chest pain., Disp: 25 tablet, Rfl: 3   pantoprazole  (PROTONIX ) 40 MG tablet, Take 1 tablet (40 mg total) by mouth daily., Disp: 90 tablet, Rfl: 1   Tiotropium Bromide-Olodaterol (STIOLTO RESPIMAT ) 2.5-2.5 MCG/ACT AERS, Inhale 2 puffs into the lungs daily. (Patient taking differently: Inhale 2 puffs into the lungs at bedtime.), Disp: 1 each, Rfl: 12   triamcinolone  cream (KENALOG ) 0.1 %, Apply 1 Application topically 2 (two) times daily. (Patient taking differently: Apply 1 Application topically daily.), Disp: 453 g, Rfl: 0   [Paused] donepezil  (ARICEPT ) 10 MG tablet, Take 1 tablet (10 mg total) by mouth at bedtime. (Patient not taking: Reported on 01/10/2024), Disp: 90 tablet, Rfl: 1   [Paused] escitalopram  (LEXAPRO ) 10 MG tablet, Take 1 tablet (10 mg total) by mouth daily. (Patient not taking: Reported on 01/10/2024), Disp: 90 tablet, Rfl: 1  rosuvastatin  (CRESTOR ) 10 MG tablet, Take 1 tablet (10 mg total) by mouth daily. (Patient not taking: Reported on 01/10/2024), Disp: 90 tablet, Rfl: 1      Objective:   Vitals:   01/10/24 0922 01/10/24 0924  BP: 122/68   Pulse: 70   SpO2: 99%   Weight:  241 lb (109.3 kg)  Height:  6\' 3"  (1.905 m)    Estimated body mass index is 30.12 kg/m as calculated from the following:   Height as of this encounter: 6\' 3"  (1.905 m).   Weight as of this encounter: 241 lb (109.3 kg).  @WEIGHTCHANGE @  [employer]   01/10/24 0924  Weight: 241 lb (109.3 kg)     Physical Exam   General: No distress. Looks well O2 at rest: no Cane present: no Sitting in wheel chair: no Frail: no Obese: no Neuro: Alert and Oriented x 3. GCS 15. Speech normal Psych: Pleasant Resp:  Barrel Chest - no.  Wheeze - no, Crackles - no, No overt respiratory distress CVS: Normal heart sounds. Murmurs - no Ext: Stigmata of Connective Tissue Disease - no HEENT: Normal upper airway. PEERL +. No post nasal drip        Assessment:       ICD-10-CM   1. 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 ILD (interstitial lung disease) (HCC)  J84.9 CT Chest High Resolution    Sjogrens syndrome-A extractable nuclear antibody    Sjogrens syndrome-B extractable nuclear antibody    Sed Rate (ESR)    QuantiFERON-TB Gold Plus    Myositis Specific II Antibodies Panel    3. Pulmonary emphysema, unspecified emphysema type (HCC)  J43.9 CT Chest High Resolution    Sjogrens syndrome-A extractable nuclear antibody    Sjogrens syndrome-B extractable nuclear antibody    Sed Rate (ESR)    QuantiFERON-TB Gold Plus    Myositis Specific II Antibodies Panel    4. Stopped smoking with greater than 30 pack year history  Z87.891 CT Chest High Resolution    Sjogrens syndrome-A extractable nuclear antibody    Sjogrens syndrome-B extractable nuclear antibody    Sed Rate (ESR)    QuantiFERON-TB Gold Plus    Myositis Specific II Antibodies Panel    5. Screening for lung cancer  Z12.2 CT Chest High Resolution    Sjogrens syndrome-A extractable nuclear antibody    Sjogrens syndrome-B extractable nuclear antibody    Sed Rate (ESR)    QuantiFERON-TB Gold Plus    Myositis Specific II Antibodies Panel          Plan:     Patient Instructions   Pulmonary emphysema, unspecified emphysema type (HCC) Alpha 1 MS phenotyple  - stable   Plan  - continue stiolto scheduled with albuterol  as needed   ILD (interstitial lung disease) (HCC)  - this is mild and post inflamatory from 2013 pneumonia. Stable between CT 2019 =-> 2024 CT chest -> 2025 PFT = physical  exam shows clubbing and mechanic hands - need to rule out Autimmune ILD - need to followuwp CT next few months  Plan - do HRCT next few months - check Mysoitis antibiody panel, ssa, ssb, scl-70 - check Quantiferon gold  Lung cancer screeening 30-45pack per day  former smoker   Last CT chest April 2024 without cancer   Plan  - capture information in HRCT next few  months  Syncope   - per cardiology  Sleep Apnea  - per your sleep docor  Followup -3 months or sooner if needed; 30 min vsiit  - ILD symp score and walk test at fllowup   FOLLOWUP Return in about 3 months (around 04/10/2024) for 15 min visit, with Dr Bertrum Brodie, Face to Face Visit, after HRCT chest.    SIGNATURE    Dr. Maire Moore, M.D., F.C.C.P,  Pulmonary and Critical Care Medicine Staff Physician, [facility] Center Director - Interstitial Lung Disease  Program  Pulmonary Fibrosis [facility] at [facility] [place], [place], [address]  Pager: [phone], If no answer or between  15:00h - 7:00h: call [phone] Telephone: [phone]  10:17 AM 01/10/2024

## 2024-01-10 ENCOUNTER — Ambulatory Visit: Payer: BC Managed Care – PPO | Admitting: Internal Medicine

## 2024-01-10 ENCOUNTER — Encounter: Payer: Self-pay | Admitting: Internal Medicine

## 2024-01-10 ENCOUNTER — Ambulatory Visit (INDEPENDENT_AMBULATORY_CARE_PROVIDER_SITE_OTHER): Payer: BC Managed Care – PPO | Admitting: Internal Medicine

## 2024-01-10 VITALS — BP 122/68 | HR 70 | Ht 75.0 in | Wt 241.0 lb

## 2024-01-10 DIAGNOSIS — J439 Emphysema, unspecified: Secondary | ICD-10-CM

## 2024-01-10 DIAGNOSIS — Z87891 Personal history of nicotine dependence: Secondary | ICD-10-CM | POA: Diagnosis not present

## 2024-01-10 DIAGNOSIS — J849 Interstitial pulmonary disease, unspecified: Secondary | ICD-10-CM

## 2024-01-10 DIAGNOSIS — Z122 Encounter for screening for malignant neoplasm of respiratory organs: Secondary | ICD-10-CM

## 2024-01-10 DIAGNOSIS — L409 Psoriasis, unspecified: Secondary | ICD-10-CM | POA: Diagnosis not present

## 2024-01-10 DIAGNOSIS — Z79899 Other long term (current) drug therapy: Secondary | ICD-10-CM | POA: Diagnosis not present

## 2024-01-10 DIAGNOSIS — J8410 Pulmonary fibrosis, unspecified: Secondary | ICD-10-CM

## 2024-01-10 LAB — PULMONARY FUNCTION TEST
DL/VA % pred: 111 %
DL/VA: 4.57 ml/min/mmHg/L
DLCO cor % pred: 82 %
DLCO cor: 27.15 ml/min/mmHg
DLCO unc % pred: 82 %
DLCO unc: 26.99 ml/min/mmHg
FEF 25-75 Pre: 4.18 L/s
FEF2575-%Pred-Pre: 119 %
FEV1-%Pred-Pre: 81 %
FEV1-Pre: 3.55 L
FEV1FVC-%Pred-Pre: 111 %
FEV6-%Pred-Pre: 76 %
FEV6-Pre: 4.23 L
FEV6FVC-%Pred-Pre: 104 %
FVC-%Pred-Pre: 73 %
FVC-Pre: 4.23 L
Pre FEV1/FVC ratio: 84 %
Pre FEV6/FVC Ratio: 100 %

## 2024-01-10 LAB — SEDIMENTATION RATE: Sed Rate: 3 mm/h (ref 0–20)

## 2024-01-10 NOTE — Patient Instructions (Signed)
Spirometry/Diffusion Capacity performed today.

## 2024-01-10 NOTE — Progress Notes (Signed)
 Spirometry and diffusion capacity performed today.

## 2024-01-16 LAB — MYOSITIS SPECIFIC II ANTIBODIES PANEL
EJ AB: <11 SI (ref <11)
JO-1 AB: <11 SI (ref <11)
MDA-5 AB: <11 SI (ref <11)
MI-2 ALPHA AB: <11 SI (ref <11)
MI-2 BETA AB: <11 SI (ref <11)
NXP-2 AB: <11 SI (ref <11)
OJ AB: <11 SI (ref <11)
PL-12 AB: <11 SI (ref <11)
PL-7 AB: <11 SI (ref <11)
SRP-AB: <11 SI (ref <11)
TIF-1y AB: <11 SI (ref <11)

## 2024-01-16 LAB — QUANTIFERON-TB GOLD PLUS
Mitogen-NIL: 8.32 [IU]/mL
NIL: 0.01 [IU]/mL
QuantiFERON-TB Gold Plus: NEGATIVE
TB1-NIL: 0 [IU]/mL
TB2-NIL: 0.01 [IU]/mL

## 2024-01-16 LAB — SJOGRENS SYNDROME-B EXTRACTABLE NUCLEAR ANTIBODY: SSB (La) (ENA) Antibody, IgG: <1 AI

## 2024-01-16 LAB — SJOGRENS SYNDROME-A EXTRACTABLE NUCLEAR ANTIBODY: SSA (Ro) (ENA) Antibody, IgG: <1 AI

## 2024-01-17 ENCOUNTER — Encounter: Payer: Self-pay | Admitting: Internal Medicine

## 2024-01-17 NOTE — Progress Notes (Signed)
 normal

## 2024-01-31 NOTE — Progress Notes (Deleted)
 CARDIOLOGY CONSULT NOTE       Patient ID: Richard Moore MRN: [id number] DOB/AGE: November 04, 1960 63 y.o.  Referring Physician: Gaylyn Keas Primary Physician: Delfina Feller, FNP Primary Cardiologist: Stann Earnest Reason for Consultation: CAD   HPI:  63 y.o. referred by Dr Gaylyn Keas on 05/09/21 for chest pain and calcium  seen on CT scan History of COPD, GERD, HLD, HTN, OSA with oral appliance use Sees Dr Bertrum Brodie for pulmonary with chronic cough and some exertional dyspnea CT in 2019 noted nonspecific pneumonitis and also commented on aortic atherosclerosis and coronary calcification ? Early ILD and 4 mm nodule PFTls with FEV183% mild restriction no dilator response and DLCO 85% Quit smoking about 13 years ago   TTE done 11/29/20 EF 60-65% normal diastolic parameters normal RV no significant valve disease or pulmonary HTN and aortic root 3.8 cm   CT 11/29/20 showed moderate calcification of the mid/distal LAD and mild calcification in the proximal circumflex  Cardiac CTA reviewed 05/16/21 calcium  score 802 96 th percentile Left dominant Cors tightest lesion D3/IOM  50-69% FFR CT with normal values D3 0.84 and OM2 0.86  His wife is a Engineer, civil (consulting) Two older children and one 4 th year college student at [employer] who wants to get into radiology Has worked in Engineer, structural business for over40 years Has 30 acres in [place] and like to be outside  After lengthy talk turns out he went to HS with my wife at [place]  Having significant dysphagia and should see GI for endoscopy has seen Dr Howard Macho for colonoscopy in 2021    BP elevated takes his ARB/diuretic at night. Still with ETOH Has lost about 30 lbs Working a lot and golfing some.   Hospitalized 12/2023 LOC/syncope. This was at restaurant in setting of drinking bloody Mary's. R/O TTE with normal EF 60-65% Monitor placed results not available ***    ROS All other systems reviewed and negative except as noted above  Past Medical History:  Diagnosis Date   COPD  (chronic obstructive pulmonary disease) (HCC)    GERD (gastroesophageal reflux disease)    Hyperlipidemia    Hypertension    Pneumonia    Sleep apnea    doesn't wear CPAP- uses mouth piece now    Family History  Problem Relation Age of Onset   Lung cancer Father        23 years ago   Colon cancer Neg Hx    Esophageal cancer Neg Hx    Rectal cancer Neg Hx    Stomach cancer Neg Hx     Social History   Socioeconomic History   Marital status: Married    Spouse name: Not on file   Number of children: Not on file   Years of education: Not on file   Highest education level: Not on file  Occupational History   Occupation: put in Administrator    Employer: [employer]  Tobacco Use   Smoking status: Former    Current packs/day: 0.00    Average packs/day: 2.0 packs/day for 15.0 years (30.0 ttl pk-yrs)    Types: Cigarettes    Start date: 09/14/1992    Quit date: 09/15/2007    Years since quitting: 16.3   Smokeless tobacco: Never  Vaping Use   Vaping status: Never Used  Substance and Sexual Activity   Alcohol use: Yes    Alcohol/week: 6.0 standard drinks of alcohol    Types: 6 Standard drinks or equivalent per week    Comment: occasionally   Drug use: No  Sexual activity: Not on file  Other Topics Concern   Not on file  Social History Narrative   Not on file   Social Drivers of Health   Financial Resource Strain: Not on file  Food Insecurity: No Food Insecurity (12/31/2023)   Hunger Vital Sign    Worried About Running Out of Food in the Last Year: Never true    Ran Out of Food in the Last Year: Never true  Transportation Needs: No Transportation Needs (12/31/2023)   PRAPARE - Administrator, Civil Service (Medical): No    Lack of Transportation (Non-Medical): No  Physical Activity: Not on file  Stress: Not on file  Social Connections: Not on file  Intimate Partner Violence: Not At Risk (12/31/2023)   Humiliation, Afraid, Rape, and Kick questionnaire     Fear of Current or Ex-Partner: No    Emotionally Abused: No    Physically Abused: No    Sexually Abused: No    Past Surgical History:  Procedure Laterality Date   COLONOSCOPY     INGUINAL HERNIA REPAIR     right   VASECTOMY        Current Outpatient Medications:    albuterol  (VENTOLIN  HFA) 108 (90 Base) MCG/ACT inhaler, Inhale 2 puffs into the lungs every 6 (six) hours as needed for wheezing or shortness of breath., Disp: 18 g, Rfl: 6   amLODipine  (NORVASC ) 5 MG tablet, Take 1 tablet (5 mg total) by mouth daily., Disp: 90 tablet, Rfl: 1   donepezil  (ARICEPT ) 10 MG tablet, Take 1 tablet (10 mg total) by mouth at bedtime. (Patient not taking: Reported on 01/10/2024), Disp: 90 tablet, Rfl: 1   escitalopram  (LEXAPRO ) 10 MG tablet, Take 1 tablet (10 mg total) by mouth daily. (Patient not taking: Reported on 01/10/2024), Disp: 90 tablet, Rfl: 1   ezetimibe  (ZETIA ) 10 MG tablet, Take 1 tablet (10 mg total) by mouth daily., Disp: 90 tablet, Rfl: 1   losartan -hydrochlorothiazide  (HYZAAR) 100-25 MG tablet, TAKE 1 TABLET DAILY, Disp: 90 tablet, Rfl: 1   nitroGLYCERIN  (NITROSTAT ) 0.4 MG SL tablet, Place 1 tablet (0.4 mg total) under the tongue every 5 (five) minutes as needed for chest pain., Disp: 25 tablet, Rfl: 3   pantoprazole  (PROTONIX ) 40 MG tablet, Take 1 tablet (40 mg total) by mouth daily., Disp: 90 tablet, Rfl: 1   rosuvastatin  (CRESTOR ) 10 MG tablet, Take 1 tablet (10 mg total) by mouth daily. (Patient not taking: Reported on 01/10/2024), Disp: 90 tablet, Rfl: 1   Tiotropium Bromide-Olodaterol (STIOLTO RESPIMAT ) 2.5-2.5 MCG/ACT AERS, Inhale 2 puffs into the lungs daily. (Patient taking differently: Inhale 2 puffs into the lungs at bedtime.), Disp: 1 each, Rfl: 12   triamcinolone  cream (KENALOG ) 0.1 %, Apply 1 Application topically 2 (two) times daily. (Patient taking differently: Apply 1 Application topically daily.), Disp: 453 g, Rfl: 0     Physical Exam: There were no vitals taken for  this visit.    Affect appropriate Chronically ill COPD  HEENT: normal Neck supple with no adenopathy JVP normal no bruits no thyromegaly Lungs clear with no wheezing and good diaphragmatic motion Heart:  S1/S2 no murmur, no rub, gallop or click PMI normal Abdomen: benighn, BS positve, no tenderness, no AAA no bruit.  No HSM or HJR Distal pulses intact with no bruits No edema Neuro non-focal Skin warm and dry No muscular weakness   Labs:   Lab Results  Component Value Date   WBC 5.2 01/01/2024   HGB 14.4  01/01/2024   HCT 41.6 01/01/2024   MCV 88.1 01/01/2024   PLT 126 (L) 01/01/2024   No results for input(s): "NA", "K", "CL", "CO2", "BUN", "CREATININE", "CALCIUM ", "PROT", "BILITOT", "ALKPHOS", "ALT", "AST", "GLUCOSE" in the last 168 hours.  Invalid input(s): "LABALBU" No results found for: "CKTOTAL", "CKMB", "CKMBINDEX", "TROPONINI"  Lab Results  Component Value Date   CHOL 154 12/06/2023   CHOL 193 03/26/2023   CHOL 150 08/11/2021   Lab Results  Component Value Date   HDL 42 12/06/2023   HDL 38 (L) 03/26/2023   HDL 38 (L) 08/11/2021   Lab Results  Component Value Date   LDLCALC 80 12/06/2023   LDLCALC 95 03/26/2023   LDLCALC 84 08/11/2021   Lab Results  Component Value Date   TRIG 186 (H) 12/06/2023   TRIG 360 (H) 03/26/2023   TRIG 159 (H) 08/11/2021   Lab Results  Component Value Date   CHOLHDL 3.7 12/06/2023   CHOLHDL 5.1 (H) 03/26/2023   CHOLHDL 3.9 08/11/2021   No results found for: "LDLDIRECT"    Radiology: No results found.  EKG: SR rate 62 RSR'  2019  01/31/2024 SR rate 64 normal , 01/31/2024 SR rate 72 normal    ASSESSMENT AND PLAN:   CAD:  high calcium  score for age CTA with moderate OM/D3 lesions with negative FFR continue medical Rx ETT 11/23/23 non ischemic  COPD:  with ILD component f/u with Dr Bertrum Brodie ventolin  inhaler HLD:  on crestor  LDL at goal 86 Memory:  on both namenda  and aricept   Depression :  continue lexapro   HTN:   continue Hyzaar Norvasc  added 11/18/23  low sodium diet   Dysphagia:  primary should refer to GI for endoscopy and likely dilatation of esophagus F/U Dr Howard Macho  Syncope:  LOC in setting of eating/ETOH use TTE with normal EF no chest pain r/o. Normal telemetry ***  F/U in a year   Signed: Janelle Mediate 01/31/2024, 4:46 PM

## 2024-02-01 ENCOUNTER — Ambulatory Visit: Admitting: Physician Assistant

## 2024-02-08 DIAGNOSIS — L409 Psoriasis, unspecified: Secondary | ICD-10-CM | POA: Diagnosis not present

## 2024-02-08 DIAGNOSIS — Z79899 Other long term (current) drug therapy: Secondary | ICD-10-CM | POA: Diagnosis not present

## 2024-02-09 DIAGNOSIS — R55 Syncope and collapse: Secondary | ICD-10-CM

## 2024-02-10 ENCOUNTER — Ambulatory Visit: Payer: Self-pay | Admitting: Physician Assistant

## 2024-02-14 ENCOUNTER — Ambulatory Visit: Admitting: Cardiovascular Disease

## 2024-03-10 ENCOUNTER — Ambulatory Visit (HOSPITAL_BASED_OUTPATIENT_CLINIC_OR_DEPARTMENT_OTHER)

## 2024-03-20 ENCOUNTER — Ambulatory Visit (HOSPITAL_BASED_OUTPATIENT_CLINIC_OR_DEPARTMENT_OTHER)
Admission: RE | Admit: 2024-03-20 | Discharge: 2024-03-20 | Disposition: A | Discharge status: Home / Self Care | Source: Ambulatory Visit | Attending: Internal Medicine | Admitting: Internal Medicine

## 2024-03-20 DIAGNOSIS — J8410 Pulmonary fibrosis, unspecified: Secondary | ICD-10-CM | POA: Diagnosis not present

## 2024-03-20 DIAGNOSIS — J439 Emphysema, unspecified: Secondary | ICD-10-CM | POA: Insufficient documentation

## 2024-03-20 DIAGNOSIS — J849 Interstitial pulmonary disease, unspecified: Secondary | ICD-10-CM | POA: Diagnosis not present

## 2024-03-20 DIAGNOSIS — J479 Bronchiectasis, uncomplicated: Secondary | ICD-10-CM | POA: Diagnosis not present

## 2024-03-20 DIAGNOSIS — Z87891 Personal history of nicotine dependence: Secondary | ICD-10-CM | POA: Insufficient documentation

## 2024-03-20 DIAGNOSIS — R918 Other nonspecific abnormal finding of lung field: Secondary | ICD-10-CM | POA: Diagnosis not present

## 2024-03-20 DIAGNOSIS — Z122 Encounter for screening for malignant neoplasm of respiratory organs: Secondary | ICD-10-CM | POA: Insufficient documentation

## 2024-04-14 ENCOUNTER — Ambulatory Visit (INDEPENDENT_AMBULATORY_CARE_PROVIDER_SITE_OTHER): Admitting: Internal Medicine

## 2024-04-14 ENCOUNTER — Encounter: Payer: Self-pay | Admitting: Internal Medicine

## 2024-04-14 VITALS — BP 126/80 | HR 75 | Ht 75.0 in | Wt 260.0 lb

## 2024-04-14 DIAGNOSIS — J8410 Pulmonary fibrosis, unspecified: Secondary | ICD-10-CM | POA: Diagnosis not present

## 2024-04-14 DIAGNOSIS — Z122 Encounter for screening for malignant neoplasm of respiratory organs: Secondary | ICD-10-CM

## 2024-04-14 DIAGNOSIS — J849 Interstitial pulmonary disease, unspecified: Secondary | ICD-10-CM | POA: Diagnosis not present

## 2024-04-14 DIAGNOSIS — Z87891 Personal history of nicotine dependence: Secondary | ICD-10-CM

## 2024-04-14 DIAGNOSIS — J439 Emphysema, unspecified: Secondary | ICD-10-CM | POA: Diagnosis not present

## 2024-04-14 MED ORDER — ALBUTEROL SULFATE HFA 108 (90 BASE) MCG/ACT IN AERS: 2.0000 | INHALATION_SPRAY | Freq: Four times a day (QID) | RESPIRATORY_TRACT | 6 refills | Status: AC | PRN

## 2024-04-14 MED ORDER — ALBUTEROL SULFATE HFA 108 (90 BASE) MCG/ACT IN AERS: 2.0000 | INHALATION_SPRAY | Freq: Four times a day (QID) | RESPIRATORY_TRACT | 6 refills | Status: DC | PRN

## 2024-04-14 NOTE — Progress Notes (Signed)
 Subjective:     Patient ID: Richard Moore, male   DOB: Apr 03, 1961, 63 y.o.   MRN: [id number]  PCP Gladis Mustard, FNP  HPI  IOV 12/16/2017  Chief Complaint  Patient presents with   Consult    COPD per [facility]. Pt last saw MR 06/22/12.  Pt does have c/o cough sometimes with brown to yellow mucus.    63 year old male who says that in 2013 I took care of him for pneumonia.  Review of the chart and visualization shows that he had CT scan evidence of left lower lobe pneumonia in 2013.  After that he had complete recovery but he says clinically he did not follow-up with me.  Recently went and saw her primary care physician according to his history for left upper back pain along with lower back mid spinal area pain that is chronic.  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  As part of this evaluation for pain he had a chest x-ray that then they reported to him he has COPD [I visualized the chest x-ray from February 2019 and confirmed the presence of hyperinflation and right lower lobe atelectasis] therefore he has been referred here.  He says he that he does not have any symptoms but when I started questioning him again he started admitting to chronic cough for many years.  It happens early in the morning when he brushes the teeth.  His wife notices it.  It does not bother him.  Therefore it is mild to moderate in intensity.  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  Several years ago this was not the case.  There are no other symptoms.  Med review history shows associated lisinopril  intake for many years.  Results for Mittal, [name] (MRN [id number]) as of 12/16/2017 09:30  Ref. Range 11/05/2017 17:19  Creatinine Latest Ref Range: 0.76 - 1.27 mg/dL 9.05   Results for SUNDEEP, DESTIN (MRN [id number])  as of 12/16/2017 09:30  Ref. Range 11/05/2017 17:19  Hemoglobin Latest Ref Range: 13.0 - 17.7 g/dL 83.8    CT Chest data April 2019  IMPRESSION: 1. Scattered areas of peribronchovascular and subpleural ground-glass, with somewhat of a mid and lower lung zone predominance. Findings are nonspecific and may be post infectious/postinflammatory in etiology when compared with 02/29/2012. Nonspecific interstitial pneumonitis is not excluded. 2. 4 mm subpleural right lower lobe nodule, likely benign. No follow-up needed if patient is low-risk. Non-contrast chest CT can be considered in 12 months if patient is high-risk. This recommendation follows the consensus statement: Guidelines for Management of Incidental Pulmonary Nodules Detected on CT Images: From the Fleischner Society 2017; Radiology 2017; 284:228-243. 3.  Emphysema (ICD10-J43.9). 4. Aortic atherosclerosis (ICD10-170.0). Coronary artery calcification. 5. Tiny right renal stone.     Electronically Signed   By: Newell Eke M.D.   On: 01/01/2018 08:47     OV 11/07/2020  Subjective:  Patient ID: Richard Moore, male , DOB: December 04, 1960 , age 89 y.o. , MRN: [id number] , ADDRESS: [address] [place] [address] PCP Gladis Mustard, FNP Patient Care Team: Gladis Mustard, FNP as PCP - General (Nurse Practitioner)  This Provider for this visit: Treatment Team:  Attending Provider: Geronimo Amel, MD    11/07/2020 -   Chief Complaint  Patient presents  with   Follow-up    Recently had PNA in Jan, still winded easily     HPI Richard Moore 63 y.o. - returns for follow-up.  It is just shy of 3 years since I last saw him.  Therefore technically is an established patient.  He says after he last saw me for shortness of breath and cough [he is on lisinopril ] he had CT scan of the chest that showed a 4 mm nodule but also emphysema and plus minus early ILD.  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  He was only coming to [place] on the weekends.  Therefore has been unable to keep up with follow-up.  He says he had his Covid vaccine [not documented in our records].  He has had 2 shots of this.  He says that over the last 3 years he has had insidious worsening of shortness of breath with exertion relieved by rest.  He also has cough.  There is no associated chest pain orthopnea proximal nocturnal dyspnea.  He could be snoring.  Symptoms suggest progressive.  He is desperate about the severity of symptoms.  Mid January 2022 he had bronchitis symptoms which he described.  He went to an urgent care and was told he had double pneumonia.  But he says he was never Covid tested.  He never had a chest x-ray but was given antibiotics for double pneumonia.  He says this is set him back.   CXR Feb 2019  Narrative & Impression  CLINICAL DATA:  Chest pain and cough.   EXAM: CHEST  2 VIEW   COMPARISON:  Chest radiograph June 30, 2013   FINDINGS: Cardiomediastinal silhouette is normal. No pleural effusions or focal consolidations. Similar mild interstitial prominence. Strandy densities RIGHT lung base. Trachea projects midline and there is no pneumothorax. Soft tissue planes and included osseous structures are non-suspicious.   IMPRESSION: Mild suspected COPD with RIGHT lung base atelectasis.     Electronically Signed   By: Minerva Salle M.D.   On: 11/06/2017 01:43      .   11/29/2020 Follow up ; COPD, chronic cough, abnormal CT chest and right lung nodule Patient returns for a 1 month follow-up.  Patient was seen last visit for a follow-up with complaints of shortness of breath and cough lingering for 1 month. Sick in January 2022, dx with Pneumonia . Treated with antibiotics and steroids . Got better but cough and dyspnea persisted. Patient was set up for multiple tests and lab work.  Last visit labs showed a negative D-dimer, BNP.  Mildly elevated  LFT with ALT at 54.  Normal CBC with eosinophils at absolute count 100.  Platelets were slightly low at 128,000. High-resolution CT chest and 2D echo are pending. Covid IGG was very high indicative of recent infection.  He was started on Spiriva  last visit but did not get this at the pharmacy.  We called the pharmacy but prescription was not sent .  Since last visit patient says he is feeling about the same.  Gets short of breath with activities.  Has a daily chronic cough.  Patient is fully vaccinated for COVID-19.  Of note patient is on a ACE inhibitor. He denies any fever or discolored mucus chest pain orthopnea PND or increased leg swelling. PFTs today show mild restriction with an FEV1 at 83%, ratio 83, FVC 76%.  No significant bronchodilator response, DLCO 85%.  Plan Begin Spiriva  2 puffs  daily .  Activity as tolerated Once CT and Echo are resulted,results will be called.  Discussed with primary care provider that Zestoretic  (lisinopril ) could be making your cough worse, may need an alternative if able Follow-up with Dr. Geronimo in 6 weeks and as needed Please contact office for sooner follow up if symptoms do not improve or worsen or seek emergency care    OV 02/07/2021  Subjective:  Patient ID: Richard Moore, male , DOB: 01-19-1961 , age 37 y.o. , MRN: [id number] , ADDRESS: [address] [place] [address] PCP Gladis Mustard, FNP Patient Care Team: Gladis Mustard, FNP as PCP - General (Nurse Practitioner)  This Provider for this visit: Treatment Team:  Attending Provider: Geronimo Amel, MD    02/07/2021 -   Chief Complaint  Patient presents with   Follow-up    2 mo f/u. States he developed PNA again back in April 2022.    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 -presents for follow-up.  Last seen by myself in February 2022.   He then followed up with nurse practitioner in March 2022 and was started on Spiriva .  After that he had high-resolution CT chest.  I personally visualized this.  He has some basilar postinflammatory ILD changes.  This is mild.  It is unchanged compared to 2019.  I suspect this is postinflammatory ILD.  His pulmonary function test shows restriction with lower diffusion reflecting combination of emphysema and ILD.  On walking desaturation test today he did not desaturate.  He was started on Spiriva  and this is helping.  In April 2022 he had pneumonia according to him but he describes to me as COPD exacerbation symptoms.  Resolved after 2 rounds of antibiotics and 1 round of prednisone .  He says his x-ray showed pneumonia I do not have that x-ray with us .  The x-ray was done after the CT scan.  So the CT scan does not show any pneumonia.  His main complaint is that he is very short of breath particularly walking upstairs and at work.  Sometimes there is associated exertional chest pain.  He does have coronary artery calcification on the CT.  His echo was normal but he is never had a cardiac stress test seen a cardiologist.  His symptoms definitely seem out of proportion to his walking desaturation test.    HRCT 11/29/20  Narrative & Impression  CLINICAL DATA:  63 year old male with history of shortness of breath. Chronic cough.   EXAM: CT CHEST WITHOUT CONTRAST   TECHNIQUE: Multidetector CT imaging of the chest was performed following the standard protocol without intravenous contrast. High resolution imaging of the lungs, as well as inspiratory and expiratory imaging, was performed.   COMPARISON:  High-resolution chest CT 12/31/2017.   FINDINGS: Cardiovascular: Heart size is normal. There is no significant pericardial fluid, thickening or pericardial calcification. There is aortic atherosclerosis, as well as atherosclerosis of the great vessels of the mediastinum and the coronary arteries,  including calcified atherosclerotic plaque in the left anterior descending and left circumflex coronary arteries.   Mediastinum/Nodes: No pathologically enlarged mediastinal or hilar lymph nodes. Please note that accurate exclusion of hilar adenopathy is limited on noncontrast CT scans. Esophagus is unremarkable in appearance. No axillary lymphadenopathy.   Lungs/Pleura: High-resolution images again demonstrate some patchy areas of peripheral predominant ground-glass attenuation, septal thickening, mild cylindrical bronchiectasis and peripheral bronchiolectasis. These findings have no  definitive craniocaudal gradient. No definitive honeycombing confidently identified. Inspiratory and expiratory imaging demonstrates some mild air trapping indicative of mild small airways disease. Mild centrilobular and paraseptal emphysema again noted. No acute consolidative airspace disease. No pleural effusions. No suspicious appearing pulmonary nodules or masses are noted. Small calcified granuloma in the periphery of the right upper lobe.   Upper Abdomen: Diffuse low attenuation throughout the visualized hepatic parenchyma, indicative of hepatic steatosis.   Musculoskeletal: There are no aggressive appearing lytic or blastic lesions noted in the visualized portions of the skeleton.   IMPRESSION: 1. The appearance of the lungs is compatible with interstitial lung disease, with a spectrum of findings considered most compatible with an alternative diagnosis (not usual interstitial pneumonia) per current ATS guidelines. Overall, findings have been stable compared to prior study from 2019 and are favored to reflect mild nonspecific interstitial pneumonia (NSIP) in conjunction with a background of mild centrilobular and paraseptal emphysema. 2. Aortic atherosclerosis, in addition to 2 vessel coronary artery disease. Please note that although the presence of coronary artery calcium  documents the  presence of coronary artery disease, the severity of this disease and any potential stenosis cannot be assessed on this non-gated CT examination. Assessment for potential risk factor modification, dietary therapy or pharmacologic therapy may be warranted, if clinically indicated. 3. Hepatic steatosis.   Aortic Atherosclerosis (ICD10-I70.0) and Emphysema (ICD10-J43.9).     Electronically Signed   By: Toribio Aye M.D.   On: 11/29/2020 12:05      ECHO 11/29/20   IMPRESSIONS     1. Left ventricular ejection fraction, by estimation, is 60 to 65%. The  left ventricle has normal function. The left ventricle has no regional  wall motion abnormalities. Left ventricular diastolic parameters were  normal. The average left ventricular  global longitudinal strain is 17.6 %. The global longitudinal strain is  normal.   2. Right ventricular systolic function is normal. The right ventricular  size is normal. There is normal pulmonary artery systolic pressure.   3. The mitral valve is normal in structure. Trivial mitral valve  regurgitation. No evidence of mitral stenosis.   4. The aortic valve is tricuspid. Aortic valve regurgitation is not  visualized. No aortic stenosis is present.   5. Aortic dilatation noted. There is borderline dilatation of the  ascending aorta, measuring 38 mm.   6. The inferior vena cava is normal in size with greater than 50%  respiratory variability, suggesting right atrial pressure of 3 mmHg.     OV 06/25/2021  Subjective:  Patient ID: Richard Moore, male , DOB: 12/13/1960 , age 81 y.o. , MRN: [id number] , ADDRESS: [address] [place] [address] PCP Gladis Mustard, FNP Patient Care Team: Gladis Mustard, FNP as PCP - General (Nurse Practitioner)  This Provider for this visit: Treatment Team:  Attending Provider: Geronimo Amel, MD    06/25/2021 -   Chief Complaint  Patient presents with   Follow-up    Pt states he  has been doing good since last visit and denies any complaints.    HPI Enos Muhl 63 y.o. -returns for follow-up.  He has dyspnea due to obesity and pulmonary issues of mild emphysema and postinflammatory ILD.  He had coronary artery calcification.  He saw Dr. Maude admission.  He has been deemed to have nonobstructive coronary artery disease.  I reviewed the notes.  At this point in time his dyspnea still persist.  His dyspnea score is 14.  See below.  It  is some improved after Spiriva  but he still frustrated by it.  He says he might be getting anxious.SABRA  His anxiety score is 5.  He has never been to pulmonary rehabilitation.  He is working overtime.  He has difficulty attending pulmonary rehabilitation.  He thinks tuberculin by Christmas.  He is willing to attend pulmonary rehabilitation in January 2023.  We discussed about upgrading his Spiriva  which has been helping him to combination Stiolto or Anoro.  He is willing to try this.  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  I have told him about this.  There is no family history of COPD but I advised him to get his family members checked out.  He declined a flu shot.  He has obesity and we discussed weight loss options.  Does not have diabetes.  Advised him a low glycemic diet with mild calorie restriction.  He is willing to try this.   10/28/2022 Pt. Presents today for medication refills.He states he has been doing well. He is at his baseline. He has not had any flares. He does have occasional exertional dyspnea. He is compliant with his Stialto daily and his albuterol  as needed.  He was last seen by Dr. Geronimo 04/2021. Last HRCT was 11/2020, and this showed stable disease. He is due for follow up.  Pt had a Coronary Calcium  scoring CT Chest 10/2020, at that time there were no suspicious appearing pulmonary nodules or masses .There was notation of hepatic steatosis. I explained that Hepatic steatosis is a term that describes the build up of fat in the  liver. It is normal to have small amounts of fat in your liver, but when the proportion of liver cells that contain fat exceeds more than 5% it is indicative of early stage fatty liver.Treatment often involves reducing risk factors through a diet and exercise plan. It is generally a benign condition, but in a small percentage of patients it does require follow up. I have asked the  patient follow up with PCP regarding potential risk factor modification, dietary therapy or pharmacologic therapy if clinically indicated.   Sats are 98%, he needs his follow up with Dr. Geronimo for his ILD at next available. We have renewed his Stialto prescription, his albuterol  rescue inhaler, and provided him with some samples today in the office.   Test Results: Coronary Calcium  scoring CT Chest 10/2020 Scattered throughout the periphery of the visualize lungs there are some patchy areas of mild ground-glass attenuation and septal thickening. Within the visualized portions of the thorax there are no suspicious appearing pulmonary nodules or masses, there is no acute consolidative airspace disease, no pleural effusions, no pneumothorax and no lymphadenopathy. Visualized portions of the upper abdomen demonstrates diffuse low attenuation throughout the visualized hepatic parenchyma, indicative of hepatic steatosis. There are no aggressive appearing lytic or blastic lesions noted in the visualized portions of the skeleton. The appearance of the lung bases is once again concerning for interstitial lung disease, similar to prior high-resolution chest CT 11/29/2020. Hepatic steatosis was also noted.   OV 12/07/2022  Subjective:  Patient ID: Richard Moore, male , DOB: 1961/04/29 , age 29 y.o. , MRN: [id number] , ADDRESS: [address] [place] [address] PCP Gladis Mustard, FNP Patient Care Team: Gladis Mustard, FNP as PCP - General (Nurse Practitioner)  This Provider for this visit:  Treatment Team:  Attending Provider: Geronimo Amel, MD   12/07/2022 -   Chief Complaint  Patient presents with   Follow-up  No c/o f/u on copd      HPI Gee Habig 63 y.o. -returns for follow-up.  I personally not seen him since 2022.  He then ran out of his Stiolto and saw a nurse practitioner Lauraine Schultze last month.  She refilled this on a 90-day supply with [facility].  He says he is doing well.  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  He has dyspnea on exertion but it is mild.  It is not impeding his work.  He continues to be very strong and fully active.  His last pulmonary function test was in 2022.  Last CT scan of the chest was in 2022.  I went over a smoking history.  He states he smoked at least 3 packs/day for 15 years and quit 15 years ago.  This makes him 45 pack smoking history.  We went over lung cancer screening guidelines.  He also has ILD.  He is willing to get a high-resolution CT scan of the chest done that will cover both progression of ILD issues and also lung cancer screening.  Subjectively he has not progressed.  However we do not have objective data.  We discussed respiratory vaccines.  He says he took COVID-vaccine upon the insistence of his wife but he prefers not to take vaccines.  We went over the flu shot and the RSV vaccine.  Indicated to him that these vaccines are more established safety profile.  He is going to reflect whether he will change his mindset about vaccines.          OV 01/10/2024  Subjective:  Patient ID: Richard Moore, male , DOB: 1961-08-22 , age 32 y.o. , MRN: [id number] , ADDRESS: [address] [place] [address] PCP Gladis Mustard, FNP Patient Care Team: Gladis Mustard, FNP as PCP - General (Nurse Practitioner) Delford Maude BROCKS, MD as PCP - Cardiology (Cardiology)  This Provider for this visit: Treatment Team:  Attending Provider: Geronimo Amel, MD  5  01/10/2024 -   Chief Complaint  Patient  presents with   Follow-up    PFT done today. Breathing is overall doing well. He is using his albuterol  inhaler 2 x daily on average.      HPI Trevor Wilkie 63 y.o. -presents for follow-up.  Presents with his wife Sari.  He has had a hospitalization for syncope.  Etiology not known.  ZIO monitor in progress.  Wife states that when he takes a deep inhalation for his inhalers he does get bradycardic for a few seconds.  From a respiratory standpoint he is stable.  He is memory issues continue.  His Lexapro  and Aricept  are on hold.  The wife asked me about this.  I defer this to primary care/neurology.  In addition they had questions about his sleep apnea.  Reviewed external medical records indicate that his sleep apnea is being managed by neurology.  I asked him to follow-up with them.  He has cardiology workup in progress.  His exercise hypoxemia test is also stable.  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  He is also a Curator.  However the presence of clubbing ILD and mechanic hands indicated to the wife need to rule out myositis.  In 2022 a limited serology profile was negative.  His last CT scan was a year ago.  He needs another CT scan to follow-up with  ILD and also to ensure there is no lung cancer.  They aligned with getting all this workup done.         CT Chest data from date:   - personally visualized and independently interpreted :  - my findings are:   IMPRESSION: 1. Stable findings in the lungs once again suggestive of interstitial lung disease and emphysema, as above, essentially unchanged compared to the prior study, most compatible with an alternative diagnosis (not usual interstitial pneumonia) per current ATS guidelines, once again favored to reflect mild nonspecific interstitial pneumonia (NSIP). 2. Aortic atherosclerosis, in addition to three-vessel  coronary artery disease. Please note that although the presence of coronary artery calcium  documents the presence of coronary artery disease, the severity of this disease and any potential stenosis cannot be assessed on this non-gated CT examination. Assessment for potential risk factor modification, dietary therapy or pharmacologic therapy may be warranted, if clinically indicated. 3. Hepatic steatosis.   Aortic Atherosclerosis (ICD10-I70.0) and Emphysema (ICD10-J43.9).     Electronically Signed   By: Toribio Aye M.D.   On: 12/28/2022 12:48 OV 04/14/2024  Subjective:  Patient ID: Richard Moore, male , DOB: 02-19-1961 , age 89 y.o. , MRN: [id number] , ADDRESS: [address] [place] [address] PCP Gladis Mustard, FNP Patient Care Team: Gladis Mustard, FNP as PCP - General (Nurse Practitioner) Delford Maude BROCKS, MD as PCP - Cardiology (Cardiology)  This Provider for this visit: Treatment Team:  Attending Provider: Geronimo Amel, MD    04/14/2024 -   Chief Complaint  Patient presents with   Medical Management of Chronic Issues   Emphysema    Breathing is overall doing well and he denies new co's.       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 -presents for follow-up.  I saw him in April 2025.  No further syncope. Interim Health status: No new complaints No new medical problems. No new surgeries. No ER visits. No Urgent care visits. No changes to medications.  Respiratory status stable.  He wants Stiolto  prescription.  He did lose some weight and may be that is why some symptoms are better.  But he still has difficulty with stairs.  He had a CT scan of the chest and there is no change in his ILD emphysema.  There are some scattered nodules in the 39-month CT chest was recommended but being 62 and a former 30 pack smoker we will get a CT chest for 1 year.  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  I then advised him to talk to primary care about weight loss drugs.  In terms of his mechanic hands this continues but myositis antibody panel is negative.       SYMPTOM SCALE - ILD 02/07/2021  06/25/2021  04/14/2024   O2 use ra ra RA  Shortness of Breath 0 -> 5 scale with 5 being worst (score  6 If unable to do)    At rest 2 0 0  Simple tasks - showers, clothes change, eating, shaving 3 2 1   Household (dishes, doing bed, laundry) 3 2 1   Shopping 3 2 1   Walking level at own pace 3 3 1   Walking up Stairs 5 5 4   Total (30-36) Dyspnea Score 19 14 8   How bad is your cough? 2 1 0  How bad is your fatigue 4 5 3   How bad is nausea 0 0 0  How bad is vomiting?  0 0 0  How bad is diarrhea? 4 0 2  How bad is anxiety? 3 5 3   How bad is depression 1 0 0         SIT STAND TEST - goal 15 times   01/10/2024  04/14/2024   O2 used ra ra  PRobe - finter or forehead finger FINGER  Number sit and stand completed - goal 15 15 15   Time taken to complete 1 min and 10 sec 1 MIN AND 11 SEC  Resting Pulse Ox/HR/Dyspnea  98% and 67/min and dyspnea of 1/10  97% and HR 77 and score 1  Peak measures 97 % and 86/min and dyspnea of 2/10 97% and HR 100 and score 4  Final Pulse Ox/HR 96% and 80/min and dyspnea of 1/10 97% and HR 78 and score 1  Desaturated </= 88% no no  Desaturated <= 3% points ni no  Got Tachycardic >/= 90/min no yes  Miscellaneous comments x x    CT Chest data from date: 1*  - personally  visualized and independently interpreted : no - my findings are: per report Narrative & Impression  CLINICAL DATA:  Interstitial lung disease, former smoker   EXAM: CT CHEST WITHOUT CONTRAST   TECHNIQUE: Multidetector CT imaging of the chest was performed following the standard protocol without intravenous contrast. High resolution imaging of the lungs, as well as inspiratory and expiratory imaging, was performed.   RADIATION DOSE REDUCTION: This exam was performed according to the departmental dose-optimization program which includes automated exposure control, adjustment of the mA and/or kV according to patient size and/or use of iterative reconstruction technique.   COMPARISON:  Chest CT December 28, 2022   FINDINGS: Cardiovascular: The heart size is normal. Atherosclerotic calcifications of the coronary arteries and aorta. No pericardial effusion.   Mediastinum/Nodes: A few subcentimeter mediastinal lymph nodes. Normal thyroid . Normal appearing esophagus.   Lungs/Pleura: Multifocal areas of paraseptal emphysematous changes/honeycombing like appearance with associated mild bronchiolectasis and interlobular septal thickening, involving subpleural bilateral upper lobes and bilateral lower lobes in a similar distribution and extent to prior. Scattered pulmonary nodules up to 6 mm for example in right middle lobe (4/58, 97), stable to prior. No new consolidation or pleural effusion. Trachea and major airways are patent. Mild bronchial wall thickening.   Upper Abdomen: Hepatic steatosis.  Small splenule.   Musculoskeletal: Mild degenerative changes of the spine   IMPRESSION: Multifocal mild paraseptal emphysematous changes with associated mild fibrosis and bronchiectasis, grossly stable to prior. 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    Scattered pulmonary nodules measuring up to 6 mm, stable to prior. Recommend follow-up CT in 18 months in high-risk patient. This recommendation follows the consensus statement: Guidelines for Management of Incidental Pulmonary Nodules Detected on CT Images: From the Fleischner Society 2017; Radiology 2017; 284:228-243.  Hepatic steatosis.   Aortic Atherosclerosis (ICD10-I70.0) and paraseptal emphysema (ICD10-J43.9).     Electronically Signed   By: Megan  Zare M.D.   On: 03/21/2024 20:05    PFT     Latest Ref Rng & Units 01/10/2024    8:26 AM 11/29/2020    9:50 AM  PFT Results  FVC-Pre L 4.23  4.38   FVC-Predicted Pre % 73  74   FVC-Post L  4.50   FVC-Predicted Post %  76   Pre FEV1/FVC % % 84  82   Post FEV1/FCV % %  83   FEV1-Pre L 3.55  3.60   FEV1-Predicted Pre % 81  80   FEV1-Post L  3.75   DLCO uncorrected ml/min/mmHg 26.99  28.44   DLCO UNC% % 82  85   DLCO corrected ml/min/mmHg 27.15  27.35   DLCO COR %Predicted % 82  81   DLVA Predicted % 111  105   TLC L  6.17   TLC % Predicted %  75   RV % Predicted %  70        LAB RESULTS last 96 hours No results found.       has a past medical history of COPD (chronic obstructive pulmonary disease) (HCC), GERD (gastroesophageal reflux disease), Hyperlipidemia, Hypertension, Pneumonia, and Sleep apnea.   reports that he quit smoking about 16 years ago. His smoking use included cigarettes. He started smoking about 31 years ago. He has a 30 pack-year smoking history. He has never used smokeless tobacco.  Past Surgical History:  Procedure Laterality Date   COLONOSCOPY     INGUINAL HERNIA REPAIR     right   VASECTOMY      No Known Allergies   There is no immunization history on file for this patient.  Family History  Problem Relation Age of Onset   Lung cancer Father        23 years ago   Colon cancer Neg Hx    Esophageal cancer Neg Hx    Rectal cancer Neg Hx    Stomach cancer Neg Hx       Current Outpatient Medications:    amLODipine  (NORVASC ) 5 MG tablet, Take 1 tablet (5 mg total) by mouth daily., Disp: 90 tablet, Rfl: 1   donepezil  (ARICEPT ) 10 MG tablet, Take 1 tablet (10 mg total) by mouth at bedtime., Disp: 90 tablet, Rfl: 1   escitalopram  (LEXAPRO ) 10 MG tablet, Take 1 tablet (10 mg total) by mouth daily., Disp: 90 tablet, Rfl: 1   ezetimibe  (ZETIA ) 10 MG tablet, Take 1 tablet (10 mg total) by mouth daily., Disp: 90 tablet, Rfl: 1   losartan -hydrochlorothiazide  (HYZAAR) 100-25 MG tablet, TAKE 1 TABLET DAILY, Disp: 90 tablet, Rfl: 1   nitroGLYCERIN  (NITROSTAT ) 0.4 MG SL tablet, Place 1 tablet (0.4 mg total) under the tongue every 5 (five) minutes as needed for chest pain., Disp: 25 tablet, Rfl: 3   pantoprazole  (PROTONIX ) 40 MG tablet, Take 1 tablet (40 mg total) by mouth daily., Disp: 90 tablet, Rfl: 1   rosuvastatin  (CRESTOR ) 10 MG tablet, Take 1 tablet (10 mg total) by mouth daily., Disp: 90 tablet, Rfl: 1   Tiotropium Bromide-Olodaterol (STIOLTO RESPIMAT ) 2.5-2.5 MCG/ACT AERS, Inhale 2 puffs into the lungs daily., Disp: 1 each, Rfl: 12   triamcinolone  cream (KENALOG ) 0.1 %, Apply 1 Application topically 2 (two) times daily., Disp: 453 g, Rfl: 0   albuterol  (VENTOLIN  HFA) 108 (90 Base) MCG/ACT inhaler, Inhale 2 puffs into  the lungs every 6 (six) hours as needed for wheezing or shortness of breath., Disp: 18 g, Rfl: 6      Objective:   Vitals:   04/14/24 0824  BP: 126/80  Pulse: 75  SpO2: 97%  Weight: 260 lb (117.9 kg)  Height: 6' 3 (1.905 m)    Estimated body mass index is 32.5 kg/m as calculated from the following:   Height as of this encounter: 6' 3 (1.905 m).   Weight as of this encounter: 260 lb (117.9 kg).  @WEIGHTCHANGE @  [employer]   04/14/24 0824  Weight: 260 lb (117.9 kg)     Physical Exam   General: No distress. Looks well O2 at rest: no Cane present: no Sitting in wheel chair: no Frail: no Obese: no Neuro: Alert and  Oriented x 3. GCS 15. Speech normal Psych: Pleasant Resp:  Barrel Chest - no.  Wheeze - no, Crackles - no, No overt respiratory distress CVS: Normal heart sounds. Murmurs - no Ext: Stigmata of Connective Tissue Disease - n. MECHANIC HANDS + HEENT: Normal upper airway. PEERL +. 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 Stable between CT 2019 =-> 2024 CT chest -> 2025 PFT = physical  exam shows clubbing and mechanic hands - but no evidence of myositis  Plan - do HRCT in 1 year - do spirometry and dlco in 1 year  Lung cancer screeening 30-45pack per day former smoker   Last CT chest July 2025 without cancer   Plan  - capture information in HRCT  in 1 year - summer 2026  Shortness of breath   Plan  - discussed rehab but your schedule will not allow it - discuss with PCP Gladis, Mary-Margaret, FNP about weight loss drugs - manage emphysema as above  Sleep Apnea  - per your sleep docor  Followup -6 months with APP - 12 month with Dr Geronimo 15 min slot    FOLLOWUP    Return for 6 months app and 12 months Clebert Wenger but after CT and PFT.    SIGNATURE    Dr. Dorethia Geronimo, M.D., F.C.C.P,  Pulmonary and Critical Care Medicine Staff Physician, [facility] Director - Interstitial Lung Disease  Program  Pulmonary Fibrosis [facility] [place], [place], [address]  Pager: [phone], If no answer or between  15:00h - 7:00h: call [phone] Telephone: [phone]  9:02 AM 04/14/2024

## 2024-04-14 NOTE — Patient Instructions (Addendum)
  Pulmonary emphysema, unspecified emphysema type (HCC) Alpha 1 MS phenotyple  - stable   Plan  - continue stiolto scheduled with albuterol  as needed   ILD (interstitial lung disease) (HCC)  - this is mild and post inflamatory from 2013 pneumonia. Stable between CT 2019 =-> 2024 CT chest -> 2025 PFT = physical  exam shows clubbing and mechanic hands - but no evidence of myositis  Plan - do HRCT in 1 year - do spirometry and dlco in 1 year  Lung cancer screeening 30-45pack per day former smoker   Last CT chest July 2025 without cancer   Plan  - capture information in HRCT  in 1 year - summer 2026  Shortness of breath   Plan  - discussed rehab but your schedule will not allow it - discuss with PCP Gladis, Mary-Margaret, FNP about weight loss drugs - manage emphysema as above  Sleep Apnea  - per your sleep docor  Followup -6 months with APP - 12 month with Dr Geronimo 15 min slot

## 2024-04-27 ENCOUNTER — Other Ambulatory Visit: Payer: Self-pay | Admitting: Nurse Practitioner

## 2024-04-27 DIAGNOSIS — E785 Hyperlipidemia, unspecified: Secondary | ICD-10-CM

## 2024-05-08 DIAGNOSIS — L409 Psoriasis, unspecified: Secondary | ICD-10-CM | POA: Diagnosis not present

## 2024-06-06 ENCOUNTER — Ambulatory Visit: Admitting: Nurse Practitioner

## 2024-06-16 ENCOUNTER — Ambulatory Visit: Admitting: Cardiovascular Disease

## 2024-06-23 NOTE — Progress Notes (Signed)
 CARDIOLOGY CONSULT NOTE       Patient ID: Richard Moore MRN: [id number] DOB/AGE: 09-20-1960 63 y.o.  Primary Physician: Gladis Mustard, FNP Primary Cardiologist: Delford   HPI:  63 y.o. referred by Dr Gladis on 05/09/21 for chest pain and calcium  seen on CT scan History of COPD, GERD, HLD, HTN, OSA with oral appliance use Sees Dr Geronimo for pulmonary with chronic cough and some exertional dyspnea CT in 2019 noted nonspecific pneumonitis and also commented on aortic atherosclerosis and coronary calcification ? Early ILD and 4 mm nodule PFTls with FEV183% mild restriction no dilator response and DLCO 85% Quit smoking about 13 years ago   TTE done 11/29/20 EF 60-65% normal diastolic parameters normal RV no significant valve disease or pulmonary HTN and aortic root 3.8 cm   CT 11/29/20 showed moderate calcification of the mid/distal LAD and mild calcification in the proximal circumflex  Cardiac CTA reviewed 05/16/21 calcium  score 802 96 th percentile Left dominant Cors tightest lesion D3/IOM  50-69% FFR CT with normal values D3 0.84 and OM2 0.86  His wife is a Engineer, civil (consulting) Two older children and one graduate of [employer]  Has worked in Engineer, structural business for over40 years Has 30 acres in [place] and like to be outside  After lengthy talk turns out he went to [employer] with my wife at [employer]  Having significant dysphagia and should see GI for endoscopy has seen Dr Teressa for colonoscopy in 2021    BP elevated takes his ARB/diuretic at night. Still with ETOH Has lost about 30 lbs Working a lot and golfing some.   Admitted 4/18-20 2025 for syncope He had finished dinner and 2 bloody Mary's with acute dizziness and diaphoresis with shaking of RUE remained lethargic after episode He r/o, not orthostatic, TTE with normal EF and valves EEG negative  CT head negative and telemetry with no AV block or PAF. Troponin also negative   Monitor revewed from 02/09/24 benign with average HR 80 bpm only one episode  of SVT 18 beats PAC/PVC < 1% no PAF high grade AV block or NSVT  Sees Ramaswamy for ILD/emphysema and pulmonary nodules stable by CT 03/21/24 Has albuterol  and Stiolto inhalers   He never gets a flu shot Has not had recurrence of his syncope     ROS All other systems reviewed and negative except as noted above  Past Medical History:  Diagnosis Date   COPD (chronic obstructive pulmonary disease) (HCC)    GERD (gastroesophageal reflux disease)    Hyperlipidemia    Hypertension    Pneumonia    Sleep apnea    doesn't wear CPAP- uses mouth piece now    Family History  Problem Relation Age of Onset   Lung cancer Father        23 years ago   Colon cancer Neg Hx    Esophageal cancer Neg Hx    Rectal cancer Neg Hx    Stomach cancer Neg Hx     Social History   Socioeconomic History   Marital status: Married    Spouse name: Not on file   Number of children: Not on file   Years of education: Not on file   Highest education level: Not on file  Occupational History   Occupation: put in Secondary school teacher: [employer]  Tobacco Use   Smoking status: Former    Current packs/day: 0.00    Average packs/day: 2.0 packs/day for 15.0 years (30.0 ttl pk-yrs)  Types: Cigarettes    Start date: 09/14/1992    Quit date: 09/15/2007    Years since quitting: 16.8   Smokeless tobacco: Never  Vaping Use   Vaping status: Never Used  Substance and Sexual Activity   Alcohol use: Yes    Alcohol/week: 6.0 standard drinks of alcohol    Types: 6 Standard drinks or equivalent per week    Comment: occasionally   Drug use: No   Sexual activity: Not on file  Other Topics Concern   Not on file  Social History Narrative   Not on file   Social Drivers of Health   Financial Resource Strain: Not on file  Food Insecurity: No Food Insecurity (12/31/2023)   Hunger Vital Sign    Worried About Running Out of Food in the Last Year: Never true    Ran Out of Food in the Last Year: Never true   Transportation Needs: No Transportation Needs (12/31/2023)   PRAPARE - Administrator, Civil Service (Medical): No    Lack of Transportation (Non-Medical): No  Physical Activity: Not on file  Stress: Not on file  Social Connections: Not on file  Intimate Partner Violence: Not At Risk (12/31/2023)   Humiliation, Afraid, Rape, and Kick questionnaire    Fear of Current or Ex-Partner: No    Emotionally Abused: No    Physically Abused: No    Sexually Abused: No    Past Surgical History:  Procedure Laterality Date   COLONOSCOPY     INGUINAL HERNIA REPAIR     right   VASECTOMY        Current Outpatient Medications:    albuterol  (VENTOLIN  HFA) 108 (90 Base) MCG/ACT inhaler, Inhale 2 puffs into the lungs every 6 (six) hours as needed for wheezing or shortness of breath., Disp: 18 g, Rfl: 6   amLODipine  (NORVASC ) 5 MG tablet, Take 1 tablet (5 mg total) by mouth daily., Disp: 90 tablet, Rfl: 1   donepezil  (ARICEPT ) 10 MG tablet, Take 1 tablet (10 mg total) by mouth at bedtime., Disp: 90 tablet, Rfl: 1   escitalopram  (LEXAPRO ) 10 MG tablet, Take 1 tablet (10 mg total) by mouth daily., Disp: 90 tablet, Rfl: 1   ezetimibe  (ZETIA ) 10 MG tablet, Take 1 tablet (10 mg total) by mouth daily., Disp: 90 tablet, Rfl: 1   losartan -hydrochlorothiazide  (HYZAAR) 100-25 MG tablet, TAKE 1 TABLET DAILY, Disp: 90 tablet, Rfl: 1   nitroGLYCERIN  (NITROSTAT ) 0.4 MG SL tablet, Place 1 tablet (0.4 mg total) under the tongue every 5 (five) minutes as needed for chest pain., Disp: 25 tablet, Rfl: 3   pantoprazole  (PROTONIX ) 40 MG tablet, Take 1 tablet (40 mg total) by mouth daily., Disp: 90 tablet, Rfl: 1   rosuvastatin  (CRESTOR ) 10 MG tablet, TAKE 1 TABLET DAILY, Disp: 90 tablet, Rfl: 0   Tiotropium Bromide-Olodaterol (STIOLTO RESPIMAT ) 2.5-2.5 MCG/ACT AERS, Inhale 2 puffs into the lungs daily., Disp: 1 each, Rfl: 12   triamcinolone  cream (KENALOG ) 0.1 %, Apply 1 Application topically 2 (two) times daily.,  Disp: 453 g, Rfl: 0     Physical Exam: Blood pressure 130/84, pulse 77, height 6' 3 (1.905 m), weight 259 lb (117.5 kg), SpO2 98%.    Affect appropriate Chronically ill COPD  HEENT: normal Neck supple with no adenopathy JVP normal no bruits no thyromegaly Lungs clear with no wheezing and good diaphragmatic motion Heart:  S1/S2 no murmur, no rub, gallop or click PMI normal Abdomen: benighn, BS positve, no tenderness, no AAA  no bruit.  No HSM or HJR Distal pulses intact with no bruits No edema Neuro non-focal Skin warm and dry No muscular weakness   Labs:   Lab Results  Component Value Date   WBC 5.2 01/01/2024   HGB 14.4 01/01/2024   HCT 41.6 01/01/2024   MCV 88.1 01/01/2024   PLT 126 (L) 01/01/2024   No results for input(s): NA, K, CL, CO2, BUN, CREATININE, CALCIUM , PROT, BILITOT, ALKPHOS, ALT, AST, GLUCOSE in the last 168 hours.  Invalid input(s): LABALBU No results found for: CKTOTAL, CKMB, CKMBINDEX, TROPONINI  Lab Results  Component Value Date   CHOL 154 12/06/2023   CHOL 193 03/26/2023   CHOL 150 08/11/2021   Lab Results  Component Value Date   HDL 42 12/06/2023   HDL 38 (L) 03/26/2023   HDL 38 (L) 08/11/2021   Lab Results  Component Value Date   LDLCALC 80 12/06/2023   LDLCALC 95 03/26/2023   LDLCALC 84 08/11/2021   Lab Results  Component Value Date   TRIG 186 (H) 12/06/2023   TRIG 360 (H) 03/26/2023   TRIG 159 (H) 08/11/2021   Lab Results  Component Value Date   CHOLHDL 3.7 12/06/2023   CHOLHDL 5.1 (H) 03/26/2023   CHOLHDL 3.9 08/11/2021   No results found for: LDLDIRECT    Radiology: No results found.  EKG: SR rate 62 RSR'  2019  06/30/2024 SR rate 64 normal , 06/30/2024 SR rate 72 normal    ASSESSMENT AND PLAN:   CAD:  high calcium  score for age CTA with moderate OM/D3 lesions with negative FFR continue medical Rx ETT normal 11/23/23 with no ischemia or arrhythmia  COPD:  with ILD component  f/u with Dr Geronimo ventolin  inhaler CT done 03/21/24 with multifocal paraseptal emphysema and mild fibrosis with bronchiectasis scattered pulmonary nodules largest 6 mm  HLD:  on crestor  LDL at goal 67 Memory:  on both namenda  and aricept   Depression :  continue lexapro   HTN:  continue Hyzaar low sodium diet add Norvasc  5 mg and monitor at home  Dysphagia:  primary should refer to GI for endoscopy and likely dilatation of esophagus F/U Dr Teressa  Syncope  negative w/u in hospital normal EF on TTE 01/01/24 and benign monitor 02/09/24   Needs to stop ETOH   F/U in a year   Signed: Maude Emmer 06/30/2024, 2:50 PM

## 2024-06-30 ENCOUNTER — Encounter: Payer: Self-pay | Admitting: Cardiovascular Disease

## 2024-06-30 ENCOUNTER — Ambulatory Visit: Attending: Cardiovascular Disease | Admitting: Cardiovascular Disease

## 2024-06-30 VITALS — BP 130/84 | HR 77 | Ht 75.0 in | Wt 259.0 lb

## 2024-06-30 DIAGNOSIS — R55 Syncope and collapse: Secondary | ICD-10-CM

## 2024-06-30 DIAGNOSIS — I10 Essential (primary) hypertension: Secondary | ICD-10-CM | POA: Diagnosis not present

## 2024-06-30 DIAGNOSIS — E782 Mixed hyperlipidemia: Secondary | ICD-10-CM

## 2024-06-30 DIAGNOSIS — I2510 Atherosclerotic heart disease of native coronary artery without angina pectoris: Secondary | ICD-10-CM | POA: Diagnosis not present

## 2024-06-30 NOTE — Patient Instructions (Signed)
 Medication Instructions:  Your physician recommends that you continue on your current medications as directed. Please refer to the Current Medication list given to you today.   Labwork: None today  Testing/Procedures: None today  Follow-Up: 1 year  Any Other Special Instructions Will Be Listed Below (If Applicable).  If you need a refill on your cardiac medications before your next appointment, please call your pharmacy.

## 2024-07-06 NOTE — Progress Notes (Unsigned)
 Subjective:    Patient ID: Richard Moore, male    DOB: [birth year], 62 y.o.   MRN: [id number]   Chief Complaint: annual physical    HPI:  Richard Moore is a 63 y.o. who identifies as a male who was assigned male at birth.   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 Elevated blood sugar Does not check blood sugar at home except occasionally. He is currently doing diet control. Lab Results  Component Value Date   HGBA1C 5.5 12/06/2023     2. Essential hypertension No c/o chest pain, sob or headache. Does not check blood pressure at home. BP Readings from Last 3 Encounters:  06/30/24 130/84  04/14/24 126/80  01/10/24 122/68     3. Hyperlipidemia with target LDL less than 100 Is on zetia  and crestor . Does not watch diet and does no exercise. Lab Results  Component Value Date   CHOL 154 12/06/2023   HDL 42 12/06/2023   LDLCALC 80 12/06/2023   TRIG 186 (H) 12/06/2023   CHOLHDL 3.7 12/06/2023     4. Pulmonary emphysema, unspecified emphysema type (HCC) Is on stiolto daily and is doing wel.denies cough and wheezing.  5. Anxiety Is on lexapro  and is doing well    06/10/2023   10:17 AM 04/01/2023    9:19 AM 08/11/2021    8:53 AM 12/13/2020    9:05 AM  GAD 7 : Generalized Anxiety Score  Nervous, Anxious, on Edge 0 0 0 2  Control/stop worrying 0 0 0 1  Worry too much - different things 0 0 0 1  Trouble relaxing 3 3 3 1   Restless 0 0 0 0  Easily annoyed or irritable 3 3 3 2   Afraid - awful might happen 0 0 0 3  Total GAD 7 Score 6 6 6 10   Anxiety Difficulty Not difficult at all Somewhat difficult Somewhat difficult Not difficult at all        6. Memory disturbance Is on aricept . He says he is doing well.  7. BMI 30.0-30.9,adult Weight is unchanged Wt Readings from Last 3 Encounters:  06/30/24 259 lb (117.5 kg)  04/14/24  260 lb (117.9 kg)  01/10/24 241 lb (109.3 kg)   BMI Readings from Last 3 Encounters:  06/30/24 32.37 kg/m  04/14/24 32.50 kg/m  01/10/24 30.12 kg/m       New complaints: None today  No Known Allergies Outpatient Encounter Medications as of 07/07/2024  Medication Sig   albuterol  (VENTOLIN  HFA) 108 (90 Base) MCG/ACT inhaler Inhale 2 puffs into the lungs every 6 (six) hours as needed for wheezing or shortness of breath.   amLODipine  (NORVASC ) 5 MG tablet Take 1 tablet (5 mg total) by mouth daily.   donepezil  (ARICEPT ) 10 MG tablet Take 1 tablet (10 mg total) by mouth at bedtime.   escitalopram  (LEXAPRO ) 10 MG tablet Take 1 tablet (10 mg total) by mouth daily.   ezetimibe  (ZETIA ) 10 MG tablet Take 1 tablet (10 mg total) by mouth daily.   losartan -hydrochlorothiazide  (HYZAAR) 100-25 MG tablet TAKE 1 TABLET DAILY   nitroGLYCERIN  (NITROSTAT ) 0.4 MG SL tablet Place 1 tablet (0.4 mg total) under the tongue every 5 (five) minutes as needed for chest pain.   pantoprazole  (PROTONIX ) 40 MG tablet Take 1 tablet (40 mg total) by mouth daily.   rosuvastatin  (CRESTOR ) 10 MG tablet TAKE 1  TABLET DAILY   Tiotropium Bromide-Olodaterol (STIOLTO RESPIMAT ) 2.5-2.5 MCG/ACT AERS Inhale 2 puffs into the lungs daily.   triamcinolone  cream (KENALOG ) 0.1 % Apply 1 Application topically 2 (two) times daily.   No facility-administered encounter medications on file as of 07/07/2024.    Past Surgical History:  Procedure Laterality Date   COLONOSCOPY     INGUINAL HERNIA REPAIR     right   VASECTOMY      Family History  Problem Relation Age of Onset   Lung cancer Father        23 years ago   Colon cancer Neg Hx    Esophageal cancer Neg Hx    Rectal cancer Neg Hx    Stomach cancer Neg Hx       Controlled substance contract: n/a     Review of Systems  Constitutional:  Negative for diaphoresis.  Eyes:  Negative for pain.  Respiratory:  Negative for shortness of breath.   Cardiovascular:   Negative for chest pain, palpitations and leg swelling.  Gastrointestinal:  Negative for abdominal pain.  Endocrine: Negative for polydipsia.  Skin:  Negative for rash.  Neurological:  Negative for dizziness, weakness and headaches.  Hematological:  Does not bruise/bleed easily.  All other systems reviewed and are negative.      Objective:   Physical Exam Vitals and nursing note reviewed.  Constitutional:      Appearance: Normal appearance. He is well-developed.  HENT:     Head: Normocephalic.     Nose: Nose normal.     Mouth/Throat:     Mouth: Mucous membranes are moist.     Pharynx: Oropharynx is clear.  Eyes:     Pupils: Pupils are equal, round, and reactive to light.  Neck:     Thyroid : No thyroid  mass or thyromegaly.     Vascular: No carotid bruit or JVD.     Trachea: Phonation normal.  Cardiovascular:     Rate and Rhythm: Normal rate and regular rhythm.  Pulmonary:     Effort: Pulmonary effort is normal. No respiratory distress.     Breath sounds: Normal breath sounds.  Abdominal:     General: Bowel sounds are normal.     Palpations: Abdomen is soft.     Tenderness: There is no abdominal tenderness.  Musculoskeletal:        General: Normal range of motion.     Cervical back: Normal range of motion and neck supple.  Lymphadenopathy:     Cervical: No cervical adenopathy.  Skin:    General: Skin is warm and dry.     Comments: Hands cracked with flaky dry rash on palms  Neurological:     Mental Status: He is alert and oriented to person, place, and time.  Psychiatric:        Behavior: Behavior normal.        Thought Content: Thought content normal.        Judgment: Judgment normal.     BP (!) 146/86   Pulse 64   Temp (!) 97.1 F (36.2 C) (Temporal)   Ht 6' 3 (1.905 m)   Wt 258 lb (117 kg)   SpO2 96%   BMI 32.25 kg/m     HGBa1c 5.9%     Assessment & Plan:  Richard Moore comes in today with chief complaint of Medical Management of Chronic  Issues   Diagnosis and orders addressed:  1. Annual physical exam (Primary)  2. Primary hypertension Low sodium diet -  CBC with Differential/Platelet - CMP14+EGFR - amLODipine  (NORVASC ) 5 MG tablet; Take 1 tablet (5 mg total) by mouth daily.  Dispense: 90 tablet; Refill: 1 - losartan -hydrochlorothiazide  (HYZAAR) 100-25 MG tablet; Take 1 tablet by mouth daily.  Dispense: 90 tablet; Refill: 1  3. Hyperlipidemia associated with type 2 diabetes mellitus (HCC) Low fta diet - Lipid panel  4. Diet-controlled diabetes mellitus (HCC) Continue to watch carbs in diet - Bayer DCA Hb A1c Waived - Microalbumin / creatinine urine ratio  5. Chronic obstructive pulmonary disease with emphysema, unspecified emphysema type (HCC)  6. Anxiety Stress management - escitalopram  (LEXAPRO ) 10 MG tablet; Take 1 tablet (10 mg total) by mouth daily.  Dispense: 90 tablet; Refill: 1  7. Memory disturbance Keep mind active - donepezil  (ARICEPT ) 10 MG tablet; Take 1 tablet (10 mg total) by mouth at bedtime.  Dispense: 90 tablet; Refill: 1  8. BMI 30.0-30.9,adult Discussed diet and exercise for person with BMI >25 Will recheck weight in 3-6 months   9. Gastroesophageal reflux disease with esophagitis without hemorrhage Avoid spicy foods Do not eat 2 hours prior to bedtime  - pantoprazole  (PROTONIX ) 40 MG tablet; Take 1 tablet (40 mg total) by mouth daily.  Dispense: 90 tablet; Refill: 1   Labs pending Health Maintenance reviewed Diet and exercise encouraged  Follow up plan: 6 months   Mary-Margaret Gladis, FNP

## 2024-07-07 ENCOUNTER — Ambulatory Visit: Admitting: Nurse Practitioner

## 2024-07-07 ENCOUNTER — Encounter: Payer: Self-pay | Admitting: Nurse Practitioner

## 2024-07-07 VITALS — BP 146/86 | HR 64 | Temp 97.1°F | Ht 75.0 in | Wt 258.0 lb

## 2024-07-07 DIAGNOSIS — Z0001 Encounter for general adult medical examination with abnormal findings: Secondary | ICD-10-CM

## 2024-07-07 DIAGNOSIS — K2100 Gastro-esophageal reflux disease with esophagitis, without bleeding: Secondary | ICD-10-CM

## 2024-07-07 DIAGNOSIS — E785 Hyperlipidemia, unspecified: Secondary | ICD-10-CM

## 2024-07-07 DIAGNOSIS — E119 Type 2 diabetes mellitus without complications: Secondary | ICD-10-CM | POA: Diagnosis not present

## 2024-07-07 DIAGNOSIS — Z0000 Encounter for general adult medical examination without abnormal findings: Secondary | ICD-10-CM

## 2024-07-07 DIAGNOSIS — I10 Essential (primary) hypertension: Secondary | ICD-10-CM

## 2024-07-07 DIAGNOSIS — Z6830 Body mass index (BMI) 30.0-30.9, adult: Secondary | ICD-10-CM

## 2024-07-07 DIAGNOSIS — F419 Anxiety disorder, unspecified: Secondary | ICD-10-CM

## 2024-07-07 DIAGNOSIS — R413 Other amnesia: Secondary | ICD-10-CM

## 2024-07-07 DIAGNOSIS — E1169 Type 2 diabetes mellitus with other specified complication: Secondary | ICD-10-CM | POA: Diagnosis not present

## 2024-07-07 DIAGNOSIS — J439 Emphysema, unspecified: Secondary | ICD-10-CM

## 2024-07-07 LAB — CBC WITH DIFFERENTIAL/PLATELET
Basophils Absolute: 0 x10E3/uL (ref 0.0–0.2)
Basos: 1 %
EOS (ABSOLUTE): 0.1 x10E3/uL (ref 0.0–0.4)
Eos: 2 %
Hematocrit: 47.9 % (ref 37.5–51.0)
Hemoglobin: 15.8 g/dL (ref 13.0–17.7)
Immature Grans (Abs): 0 x10E3/uL (ref 0.0–0.1)
Immature Granulocytes: 0 %
Lymphocytes Absolute: 1.6 x10E3/uL (ref 0.7–3.1)
Lymphs: 31 %
MCH: 30.6 pg (ref 26.6–33.0)
MCHC: 33 g/dL (ref 31.5–35.7)
MCV: 93 fL (ref 79–97)
Monocytes Absolute: 0.5 x10E3/uL (ref 0.1–0.9)
Monocytes: 10 %
Neutrophils Absolute: 2.9 x10E3/uL (ref 1.4–7.0)
Neutrophils: 56 %
Platelets: 145 x10E3/uL — ABNORMAL LOW (ref 150–450)
RBC: 5.17 x10E6/uL (ref 4.14–5.80)
RDW: 13 % (ref 11.6–15.4)
WBC: 5.2 x10E3/uL (ref 3.4–10.8)

## 2024-07-07 LAB — LIPID PANEL
Chol/HDL Ratio: 5.1 ratio — ABNORMAL HIGH (ref 0.0–5.0)
Cholesterol, Total: 205 mg/dL — ABNORMAL HIGH (ref 100–199)
HDL: 40 mg/dL (ref >39)
LDL Chol Calc (NIH): 124 mg/dL — ABNORMAL HIGH (ref 0–99)
Triglycerides: 229 mg/dL — ABNORMAL HIGH (ref 0–149)
VLDL Cholesterol Cal: 41 mg/dL — ABNORMAL HIGH (ref 5–40)

## 2024-07-07 LAB — CMP14+EGFR
ALT: 58 IU/L — ABNORMAL HIGH (ref 0–44)
AST: 37 IU/L (ref 0–40)
Albumin: 4.4 g/dL (ref 3.9–4.9)
Alkaline Phosphatase: 52 IU/L (ref 47–123)
BUN/Creatinine Ratio: 19 (ref 10–24)
BUN: 17 mg/dL (ref 8–27)
Bilirubin Total: 0.9 mg/dL (ref 0.0–1.2)
CO2: 25 mmol/L (ref 20–29)
Calcium: 9.2 mg/dL (ref 8.6–10.2)
Chloride: 103 mmol/L (ref 96–106)
Creatinine, Ser: 0.91 mg/dL (ref 0.76–1.27)
Globulin, Total: 2.4 g/dL (ref 1.5–4.5)
Glucose: 113 mg/dL — ABNORMAL HIGH (ref 70–99)
Potassium: 4.5 mmol/L (ref 3.5–5.2)
Sodium: 140 mmol/L (ref 134–144)
Total Protein: 6.8 g/dL (ref 6.0–8.5)
eGFR: 95 mL/min/1.73 (ref >59)

## 2024-07-07 LAB — BAYER DCA HB A1C WAIVED: HB A1C (BAYER DCA - WAIVED): 5.9 % — ABNORMAL HIGH (ref 4.8–5.6)

## 2024-07-07 MED ORDER — PANTOPRAZOLE SODIUM 40 MG PO TBEC: 40.0000 mg | DELAYED_RELEASE_TABLET | Freq: Every day | ORAL | 1 refills | Status: DC

## 2024-07-07 MED ORDER — ESCITALOPRAM OXALATE 10 MG PO TABS: 10.0000 mg | ORAL_TABLET | Freq: Every day | ORAL | 1 refills | Status: DC

## 2024-07-07 MED ORDER — EZETIMIBE 10 MG PO TABS: 10.0000 mg | ORAL_TABLET | Freq: Every day | ORAL | 1 refills | Status: DC

## 2024-07-07 MED ORDER — AMLODIPINE BESYLATE 5 MG PO TABS: 5.0000 mg | ORAL_TABLET | Freq: Every day | ORAL | 1 refills | Status: DC

## 2024-07-07 MED ORDER — ROSUVASTATIN CALCIUM 10 MG PO TABS: 10.0000 mg | ORAL_TABLET | Freq: Every day | ORAL | 0 refills | Status: AC

## 2024-07-07 MED ORDER — DONEPEZIL HCL 10 MG PO TABS: 10.0000 mg | ORAL_TABLET | Freq: Every day | ORAL | 1 refills | Status: AC

## 2024-07-07 MED ORDER — LOSARTAN POTASSIUM-HCTZ 100-25 MG PO TABS: 1.0000 | ORAL_TABLET | Freq: Every day | ORAL | 1 refills | Status: DC

## 2024-07-07 NOTE — Addendum Note (Signed)
 Addended by: GLADIS MUSTARD on: 07/07/2024 10:45 AM   Modules accepted: Level of Service

## 2024-07-07 NOTE — Patient Instructions (Signed)
How to Increase Your Level of Physical Activity Getting regular physical activity is important for your overall health and well-being. Most people do not get enough exercise. There are easy ways to increase your level of physical activity, even if you have not been very active in the past or if you are just starting out. What are the benefits of physical activity? Physical activity has many short-term and long-term benefits. Being active on a regular basis can improve your physical and mental health as well as provide other benefits. Physical health benefits Helping you lose weight or maintain a healthy weight. Strengthening your muscles and bones. Reducing your risk of certain long-term (chronic) diseases, including heart disease, cancer, and diabetes. Being able to move around more easily and for longer periods of time without getting tired (increased endurance or stamina). Improving your ability to fight off illness (enhanced immunity). Being able to sleep better. Helping you stay healthy as you get older, including: Helping you stay mobile, or capable of walking and moving around. Preventing accidents, such as falls. Increasing life expectancy. Mental health benefits Boosting your mood and improving your self-esteem. Lowering your chance of having mental health problems, such as depression or anxiety. Helping you feel good about your body. Other benefits Finding new sources of fun and enjoyment. Meeting new people who share a common interest. Before you begin If you have a chronic illness or have not been active for a while, check with your health care provider about how to get started. Ask your health care provider what activities are safe for you. Start out slowly. Walking or doing some simple chair exercises is a good place to start, especially if you have not been active before or for a long time. Set goals that you can work toward. Ask your health care provider how much exercise is  best for you. In general, most adults should: Do moderate-intensity exercise for at least 150 minutes each week (30 minutes on most days of the week) or vigorous exercise for at least 75 minutes each week, or a combination of these. Moderate-intensity exercise can include walking at a quick pace, biking, yoga, water aerobics, or gardening. Vigorous exercise involves activities that take more effort, such as jogging or running, playing sports, swimming laps, or jumping rope. Do strength exercises on at least 2 days each week. This can include weight lifting, body weight exercises, and resistance-band exercises. How to be more physically active Make a plan  Try to find activities that you enjoy. You are more likely to commit to an exercise routine if it does not feel like a chore. If you have bone or joint problems, choose low-impact exercises, like walking or swimming. Use these tips for being successful with an exercise plan: Find a workout partner for accountability. Join a group or class, such as an aerobics class, cycling class, or sports team. Make family time active. Go for a walk, bike, or swim. Include a variety of exercises each week. Consider using a fitness tracker, such as a mobile phone app or a device worn like a watch, that will count the number of steps you take each day. Many people strive to reach 10,000 steps a day. Find ways to be active in your daily routines Besides your formal exercise plans, you can find ways to do physical activity during your daily routines, such as: Walking or biking to work or to the store. Taking the stairs instead of the elevator. Parking farther away from the door at work  or at the store. Planning walking meetings. Walking around while you are on the phone. Where to find more information Centers for Disease Control and Prevention: [employer].pt [employer]'s [employer] on Fitness, Sports & Nutrition: www.fitness.gov ChooseMyPlate:  http://www.harvey.com/ Contact a health care provider if: You have headaches, muscle aches, or joint pain that is concerning. You feel dizzy or light-headed while exercising. You faint. You feel your heart skipping, racing, or fluttering. You have chest pain while exercising. Summary Exercise benefits your mind and body at any age, even if you are just starting out. If you have a chronic illness or have not been active for a while, check with your health care provider before increasing your physical activity. Choose activities that are safe and enjoyable for you. Ask your health care provider what activities are safe for you. Start slowly. Tell your health care provider if you have problems as you start to increase your activity level. This information is not intended to replace advice given to you by your health care provider. Make sure you discuss any questions you have with your health care provider. Document Revised: 12/27/2020 Document Reviewed: 12/27/2020 Elsevier Patient Education  2024 [employer].

## 2024-07-08 LAB — MICROALBUMIN / CREATININE URINE RATIO
Creatinine, Urine: 132.5 mg/dL
Microalb/Creat Ratio: 3 mg/g{creat} (ref 0–29)
Microalbumin, Urine: 3.7 ug/mL

## 2024-07-11 ENCOUNTER — Ambulatory Visit: Payer: Self-pay | Admitting: Nurse Practitioner

## 2024-09-18 ENCOUNTER — Other Ambulatory Visit: Payer: Self-pay | Admitting: Nurse Practitioner

## 2024-09-18 DIAGNOSIS — I10 Essential (primary) hypertension: Secondary | ICD-10-CM

## 2024-09-18 DIAGNOSIS — F419 Anxiety disorder, unspecified: Secondary | ICD-10-CM

## 2024-09-18 DIAGNOSIS — K2100 Gastro-esophageal reflux disease with esophagitis, without bleeding: Secondary | ICD-10-CM
